# Patient Record
Sex: Female | Born: 1960 | Race: White | Hispanic: No | Marital: Married | Smoking: Current every day smoker
Health system: Southern US, Community
[De-identification: ages and names within clinical notes are randomized; demographics above are authoritative.]

## PROBLEM LIST (undated history)

## (undated) ENCOUNTER — Ambulatory Visit

## (undated) DIAGNOSIS — E785 Hyperlipidemia, unspecified: Secondary | ICD-10-CM

## (undated) DIAGNOSIS — F419 Anxiety disorder, unspecified: Secondary | ICD-10-CM

## (undated) DIAGNOSIS — E119 Type 2 diabetes mellitus without complications: Secondary | ICD-10-CM

## (undated) DIAGNOSIS — T7840XA Allergy, unspecified, initial encounter: Secondary | ICD-10-CM

## (undated) HISTORY — DX: Type 2 diabetes mellitus without complications: E11.9

## (undated) HISTORY — DX: Hyperlipidemia, unspecified: E78.5

## (undated) HISTORY — DX: Allergy, unspecified, initial encounter: T78.40XA

## (undated) HISTORY — PX: CHOLECYSTECTOMY: SHX55

## (undated) HISTORY — DX: Anxiety disorder, unspecified: F41.9

## (undated) HISTORY — PX: APPENDECTOMY: SHX54

## (undated) HISTORY — PX: COLONOSCOPY: SHX174

## (undated) HISTORY — PX: ABDOMINAL HYSTERECTOMY: SHX81

---

## 2000-03-13 ENCOUNTER — Other Ambulatory Visit: Admission: RE | Admit: 2000-03-13 | Discharge: 2000-03-13 | Payer: Self-pay | Admitting: Family Medicine

## 2000-03-24 ENCOUNTER — Other Ambulatory Visit: Admission: RE | Admit: 2000-03-24 | Discharge: 2000-03-24 | Payer: Self-pay | Admitting: *Deleted

## 2000-04-17 ENCOUNTER — Other Ambulatory Visit: Admission: RE | Admit: 2000-04-17 | Discharge: 2000-04-17 | Payer: Self-pay | Admitting: *Deleted

## 2000-04-17 ENCOUNTER — Encounter (INDEPENDENT_AMBULATORY_CARE_PROVIDER_SITE_OTHER): Payer: Self-pay

## 2000-05-14 ENCOUNTER — Encounter (INDEPENDENT_AMBULATORY_CARE_PROVIDER_SITE_OTHER): Payer: Self-pay | Admitting: Specialist

## 2000-05-14 ENCOUNTER — Other Ambulatory Visit: Admission: RE | Admit: 2000-05-14 | Discharge: 2000-05-14 | Payer: Self-pay | Admitting: *Deleted

## 2000-10-27 ENCOUNTER — Other Ambulatory Visit: Admission: RE | Admit: 2000-10-27 | Discharge: 2000-10-27 | Payer: Self-pay | Admitting: *Deleted

## 2000-12-01 ENCOUNTER — Other Ambulatory Visit: Admission: RE | Admit: 2000-12-01 | Discharge: 2000-12-01 | Payer: Self-pay | Admitting: *Deleted

## 2001-01-07 ENCOUNTER — Encounter (INDEPENDENT_AMBULATORY_CARE_PROVIDER_SITE_OTHER): Payer: Self-pay | Admitting: Specialist

## 2001-01-07 ENCOUNTER — Other Ambulatory Visit: Admission: RE | Admit: 2001-01-07 | Discharge: 2001-01-07 | Payer: Self-pay | Admitting: *Deleted

## 2002-07-25 ENCOUNTER — Emergency Department (HOSPITAL_COMMUNITY): Admission: EM | Admit: 2002-07-25 | Discharge: 2002-07-26 | Payer: Self-pay

## 2003-05-25 ENCOUNTER — Other Ambulatory Visit: Admission: RE | Admit: 2003-05-25 | Discharge: 2003-05-25 | Payer: Self-pay | Admitting: *Deleted

## 2003-06-02 ENCOUNTER — Encounter: Admission: RE | Admit: 2003-06-02 | Discharge: 2003-06-02 | Payer: Self-pay | Admitting: Family Medicine

## 2004-07-15 ENCOUNTER — Other Ambulatory Visit: Admission: RE | Admit: 2004-07-15 | Discharge: 2004-07-15 | Payer: Self-pay | Admitting: *Deleted

## 2004-07-15 ENCOUNTER — Encounter: Admission: RE | Admit: 2004-07-15 | Discharge: 2004-07-15 | Payer: Self-pay | Admitting: *Deleted

## 2005-08-04 ENCOUNTER — Encounter: Admission: RE | Admit: 2005-08-04 | Discharge: 2005-08-04 | Payer: Self-pay | Admitting: *Deleted

## 2006-08-10 ENCOUNTER — Encounter: Admission: RE | Admit: 2006-08-10 | Discharge: 2006-08-10 | Payer: Self-pay | Admitting: Internal Medicine

## 2008-08-14 ENCOUNTER — Encounter: Admission: RE | Admit: 2008-08-14 | Discharge: 2008-08-14 | Payer: Self-pay | Admitting: Internal Medicine

## 2011-08-22 ENCOUNTER — Ambulatory Visit (INDEPENDENT_AMBULATORY_CARE_PROVIDER_SITE_OTHER): Payer: 59 | Admitting: Family Medicine

## 2011-08-22 VITALS — BP 133/84 | HR 91 | Temp 98.3°F | Resp 16 | Ht 67.0 in | Wt 282.0 lb

## 2011-08-22 DIAGNOSIS — R0600 Dyspnea, unspecified: Secondary | ICD-10-CM

## 2011-08-22 DIAGNOSIS — R0982 Postnasal drip: Secondary | ICD-10-CM

## 2011-08-22 DIAGNOSIS — R05 Cough: Secondary | ICD-10-CM

## 2011-08-22 MED ORDER — DOXYCYCLINE HYCLATE 100 MG PO CAPS: 100.0000 mg | ORAL_CAPSULE | Freq: Two times a day (BID) | ORAL | Status: DC

## 2011-08-22 MED ORDER — IPRATROPIUM BROMIDE 0.03 % NA SOLN: 2.0000 | Freq: Four times a day (QID) | NASAL | Status: DC

## 2011-08-22 MED ORDER — BENZONATATE 100 MG PO CAPS: 100.0000 mg | ORAL_CAPSULE | Freq: Three times a day (TID) | ORAL | Status: DC | PRN

## 2011-08-22 NOTE — Progress Notes (Signed)
  Patient Name: Molly Payne Date of Birth: 11/17/1960 Medical Record Number: [id number] Gender: female Date of Encounter: 08/22/2011  History of Present Illness:  SHATERRIA SAGER is a 51 y.o. very pleasant female patient who presents with the following:  Here with a cough and PND since Monday, runny nose.  Coughing up mucus.  No fever that she knows of but she does have some sweats at night.   No GI symptoms No ST, no earache.    Concerned because she is a smoker- wants to be sure it is nothing serious  One PPD smoker since age 44  There is no problem list on file for this patient.  No past medical history on file. No past surgical history on file. History  Substance Use Topics  . Smoking status: Current Everyday Smoker  . Smokeless tobacco: Not on file  . Alcohol Use: Not on file   No family history on file. No Known Allergies  Medication list has been reviewed and updated.  Review of Systems: As per HPI- otherwise negative.   Physical Examination: Filed Vitals:   08/22/11 1814  BP: 133/84  Pulse: 91  Temp: 98.3 F (36.8 C)  TempSrc: Oral  Resp: 16  Height: 5\' 7"  (1.702 m)  Weight: 282 lb (127.914 kg)    Body mass index is 44.17 kg/(m^2). GEN: WDWN, NAD, Non-toxic, A & O x 3, obese HEENT: Atraumatic, Normocephalic. Neck supple. No masses, No LAD.  Tm and oropharynx wnl Ears and Nose: No external deformity. CV: RRR, No M/G/R. No JVD. No thrill. No extra heart sounds. PULM: CTA B, no wheezes, crackles, rhonchi. No retractions. No resp. distress. No accessory muscle use. ABD: S, NT, ND, +BS. No rebound. No HSM. EXTR: No c/c/e NEURO Normal gait.  PSYCH: Normally interactive. Conversant. Not depressed or anxious appearing.  Calm demeanor.     Assessment and Plan: 1. Cough  benzonatate (TESSALON) 100 MG capsule, doxycycline (VIBRAMYCIN) 100 MG capsule  2. PND (paroxysmal nocturnal dyspnea)    3. PND (post-nasal drip)  ipratropium (ATROVENT) 0.03 % nasal  spray   Suspect viral cough- try tessalon and atrovent nasal for PND Doxycycline to hang on to- can fill if not better in the next few days. Let me know if getting worse or if not better if she does end up using abx.  Encouraged to stop smoking

## 2011-12-11 ENCOUNTER — Ambulatory Visit (INDEPENDENT_AMBULATORY_CARE_PROVIDER_SITE_OTHER): Payer: 59 | Admitting: Internal Medicine

## 2011-12-11 VITALS — BP 118/76 | HR 91 | Temp 98.4°F | Resp 20 | Ht 67.0 in | Wt 263.0 lb

## 2011-12-11 DIAGNOSIS — J45909 Unspecified asthma, uncomplicated: Secondary | ICD-10-CM

## 2011-12-11 DIAGNOSIS — E119 Type 2 diabetes mellitus without complications: Secondary | ICD-10-CM

## 2011-12-11 DIAGNOSIS — R05 Cough: Secondary | ICD-10-CM

## 2011-12-11 DIAGNOSIS — E669 Obesity, unspecified: Secondary | ICD-10-CM | POA: Insufficient documentation

## 2011-12-11 DIAGNOSIS — F17200 Nicotine dependence, unspecified, uncomplicated: Secondary | ICD-10-CM

## 2011-12-11 LAB — POCT GLYCOSYLATED HEMOGLOBIN (HGB A1C): Hemoglobin A1C: 13.2

## 2011-12-11 MED ORDER — METFORMIN HCL 500 MG PO TABS: 500.0000 mg | ORAL_TABLET | Freq: Two times a day (BID) | ORAL | Status: DC

## 2011-12-11 MED ORDER — AZITHROMYCIN 500 MG PO TABS: 500.0000 mg | ORAL_TABLET | Freq: Every day | ORAL | Status: DC

## 2011-12-11 MED ORDER — HYDROCODONE-ACETAMINOPHEN 7.5-500 MG/15ML PO SOLN: 5.0000 mL | Freq: Four times a day (QID) | ORAL | Status: DC | PRN

## 2011-12-11 MED ORDER — ALBUTEROL SULFATE HFA 108 (90 BASE) MCG/ACT IN AERS: 2.0000 | INHALATION_SPRAY | Freq: Four times a day (QID) | RESPIRATORY_TRACT | Status: DC | PRN

## 2011-12-11 MED ORDER — METHYLPREDNISOLONE ACETATE 80 MG/ML IJ SUSP
120.0000 mg | Freq: Once | INTRAMUSCULAR | Status: AC
  Administered 2011-12-11: 120 mg via INTRAMUSCULAR

## 2011-12-11 NOTE — Patient Instructions (Addendum)
Chronic Asthmatic Bronchitis  Chronic asthmatic bronchitis is often a complication of frequent asthma and/or bronchitis. After a long enough period of time, the continual airflow blockage is present in spite of treatment for asthma. The medications that used to treat asthma no longer work. The symptoms of chronic bronchitis may also be present. Bronchitis is an inflammation of the breathing tubules in the lungs. The combination of asthma, chronic bronchitis, and emphysema all affect the small breathing tubules (bronchial tree) in our lungs. It is a common condition. The problems from each are similar and overlap with each other so are sometimes hard to diagnose.  When the asthma and bronchitis are combined, there is usually inflammation and infection. The small bronchial tubes produce more mucus. This blocks the airways and makes breathing harder. Usually this process is caused more by external irritants than infection. Smokers with chronic bronchitis are at a greater risk to develop asthmatic bronchitis.  CAUSES    Why some people with asthma go on to develop chronic asthmatic bronchitis is not known. Smoking and environmental toxins or allergens seem to play a role. There are wide differences in who is susceptible.   Abnormalities of the small airways may develop in persons with persistent asthma. Asthmatics can be uncommonly subject to the effects of smoking. Asthma is also found associated with a number of other diseases.  SYMPTOMS   Asthma, chronic bronchitis, and emphysema all cause symptoms of cough, wheezing, shortness of breath, and recurring infections. There may also be chest discomfort. All of the above symptoms happen more often in chronic asthmatic bronchitis.  DIAGNOSIS    Asthma, chronic bronchitis, and emphysema all affect the entire bronchial tree. This makes it difficult on exam to tell them apart. Other tests of the lungs are done to prove a diagnosis. These are called pulmonary function  tests.  TREATMENT    The asthmatic condition itself must always be treated.   Infection can be treated with antibiotics (medications to kill germs).   Serious infections may require hospitalization. These can include pneumonia, sinus infections, and acute bronchitis.  HOME CARE INSTRUCTIONS   Use prescription medications as ordered by your caregiver.   Avoid pollen, dust, animal dander, molds, smoke, and other things that cause attacks at home and at work.   You may have fewer attacks if you decrease dust in your home. Electrostatic air cleaners may help.   It may help to replace your pillows or mattress with materials less likely to cause allergies.   If you are not on fluid restriction, drink 8 to 10 glasses of water each day.   Discuss possible exercise routines with your caregiver.   If animal dander is the cause of asthma, you may need to get rid of pets.   It is important that you:   Become educated about your medical condition.   Participate in maintaining wellness.   Seek medical care promptly or immediately as indicated below.   Delay in seeking medical attention could cause permanent injury and may be a risk to your life.  SEEK MEDICAL CARE IF   You have wheezing and shortness of breath even if taking medicine to prevent attacks.   An oral temperature above 102 F (38.9 C)   You have muscle aches, chest pain, or thickening of sputum.   Your sputum changes from clear or white to yellow, green, gray, or bloody.   You have any problems that may be related to the medicine you are taking (such as   MEDICAL CARE IF:  Your usual medicines do not stop your wheezing.   There is increased coughing and/or shortness of breath.   You have increased difficulty breathing.  MAKE SURE YOU:   Understand these instructions.   Will watch your condition.   Will get help right away if you are not doing well or  get worse.  Document Released: 01/09/2006 Document Revised: 03/13/2011 Document Reviewed: 03/09/2007 [facility] Patient Information 2012 [place], [place].   Diabetes Meal Planning Guide The diabetes meal planning guide is a tool to help you plan your meals and snacks. It is important for people with diabetes to manage their blood glucose (sugar) levels. Choosing the right foods and the right amounts throughout your day will help control your blood glucose. Eating right can even help you improve your blood pressure and reach or maintain a healthy weight. CARBOHYDRATE COUNTING MADE EASY When you eat carbohydrates, they turn to sugar. This raises your blood glucose level. Counting carbohydrates can help you control this level so you feel better. When you plan your meals by counting carbohydrates, you can have more flexibility in what you eat and balance your medicine with your food intake. Carbohydrate counting simply means adding up the total amount of carbohydrate grams in your meals and snacks. Try to eat about the same amount at each meal. Foods with carbohydrates are listed below. Each portion below is 1 carbohydrate serving or 15 grams of carbohydrates. Ask your dietician how many grams of carbohydrates you should eat at each meal or snack. Grains and Starches  1 slice bread.    English muffin or hotdog/hamburger bun.    cup cold cereal (unsweetened).   ? cup cooked pasta or rice.    cup starchy vegetables (corn, potatoes, peas, beans, winter squash).   1 tortilla (6 inches).    bagel.   1 waffle or pancake (size of a CD).    cup cooked cereal.   4 to 6 small crackers.  *Whole grain is recommended. Fruit  1 cup fresh unsweetened berries, melon, papaya, pineapple.   1 small fresh fruit.    banana or mango.    cup fruit juice (4 oz unsweetened).    cup canned fruit in natural juice or water.   2 tbs dried fruit.   12 to 15 grapes or cherries.  Milk and Yogurt  1  cup fat-free or 1% milk.   1 cup soy milk.   6 oz light yogurt with sugar-free sweetener.   6 oz low-fat soy yogurt.   6 oz plain yogurt.  Vegetables  1 cup raw or  cup cooked is counted as 0 carbohydrates or a "free" food.   If you eat 3 or more servings at 1 meal, count them as 1 carbohydrate serving.  Other Carbohydrates   oz chips or pretzels.    cup ice cream or frozen yogurt.    cup sherbet or sorbet.   2 inch square cake, no frosting.   1 tbs honey, sugar, jam, jelly, or syrup.   2 small cookies.   3 squares of graham crackers.   3 cups popcorn.   6 crackers.   1 cup broth-based soup.   Count 1 cup casserole or other mixed foods as 2 carbohydrate servings.   Foods with less than 20 calories in a serving may be counted as 0 carbohydrates or a "free" food.  You may want to purchase a book or computer software that lists the carbohydrate gram counts of different  foods. In addition, the nutrition facts panel on the labels of the foods you eat are a good source of this information. The label will tell you how big the serving size is and the total number of carbohydrate grams you will be eating per serving. Divide this number by 15 to obtain the number of carbohydrate servings in a portion. Remember, 1 carbohydrate serving equals 15 grams of carbohydrate. SERVING SIZES Measuring foods and serving sizes helps you make sure you are getting the right amount of food. The list below tells how big or small some common serving sizes are.  1 oz.........4 stacked dice.   3 oz........Marland KitchenDeck of cards.   1 tsp.......Marland KitchenTip of little finger.   1 tbs......Marland KitchenMarland KitchenThumb.   2 tbs.......Marland KitchenGolf ball.    cup......Marland KitchenHalf of a fist.   1 cup.......Marland KitchenA fist.  SAMPLE DIABETES MEAL PLAN Below is a sample meal plan that includes foods from the grain and starches, dairy, vegetable, fruit, and meat groups. A dietician can individualize a meal plan to fit your calorie needs and tell you the  number of servings needed from each food group. However, controlling the total amount of carbohydrates in your meal or snack is more important than making sure you include all of the food groups at every meal. You may interchange carbohydrate containing foods (dairy, starches, and fruits). The meal plan below is an example of a 2000 calorie diet using carbohydrate counting. This meal plan has 17 carbohydrate servings. Breakfast  1 cup oatmeal (2 carb servings).    cup light yogurt (1 carb serving).   1 cup blueberries (1 carb serving).    cup almonds.  Snack  1 large apple (2 carb servings).   1 low-fat string cheese stick.  Lunch  Chicken breast salad.   1 cup spinach.    cup chopped tomatoes.   2 oz chicken breast, sliced.   2 tbs low-fat [place] dressing.   12 whole-wheat crackers (2 carb servings).   12 to 15 grapes (1 carb serving).   1 cup low-fat milk (1 carb serving).  Snack  1 cup carrots.    cup hummus (1 carb serving).  Dinner  3 oz broiled salmon.   1 cup brown rice (3 carb servings).  Snack  1  cups steamed broccoli (1 carb serving) drizzled with 1 tsp olive oil and lemon juice.   1 cup light pudding (2 carb servings).  DIABETES MEAL PLANNING WORKSHEET Your dietician can use this worksheet to help you decide how many servings of foods and what types of foods are right for you.  BREAKFAST Food Group and Servings / Carb Servings Grain/Starches __________________________________ Dairy __________________________________________ Vegetable ______________________________________ Fruit ___________________________________________ Meat __________________________________________ Fat ____________________________________________ LUNCH Food Group and Servings / Carb Servings Grain/Starches ___________________________________ Dairy ___________________________________________ Fruit ____________________________________________ Meat  ___________________________________________ Fat _____________________________________________ Laural Golden Food Group and Servings / Carb Servings Grain/Starches ___________________________________ Dairy ___________________________________________ Fruit ____________________________________________ Meat ___________________________________________ Fat _____________________________________________ SNACKS Food Group and Servings / Carb Servings Grain/Starches ___________________________________ Dairy ___________________________________________ Vegetable _______________________________________ Fruit ____________________________________________ Meat ___________________________________________ Fat _____________________________________________ DAILY TOTALS Starches _________________________ Vegetable ________________________ Fruit ____________________________ Dairy ____________________________ Meat ____________________________ Fat ______________________________ Document Released: 12/19/2004 Document Revised: 03/13/2011 Document Reviewed: 10/30/2008 [employer] Patient Information 2012 [place], [place].

## 2011-12-11 NOTE — Progress Notes (Signed)
  Subjective:    Patient ID: Molly Payne, female    DOB: [birth year], 51 y.o.   MRN: [id number]  HPI Chronic smoker Obese, hx of chronic wheezing Has an infection and sputum yellow-brown No sob/cp   Review of Systems Needs cpe and r/o copd    Objective:   Physical Exam  Constitutional: She is oriented to person, place, and time. She appears well-nourished. No distress.  HENT:  Right Ear: External ear normal.  Left Ear: External ear normal.  Mouth/Throat: Oropharynx is clear and moist.  Cardiovascular: Normal rate and normal heart sounds.   Pulmonary/Chest: Effort normal. No respiratory distress. She has wheezes. She exhibits no tenderness.  Neurological: She is alert and oriented to person, place, and time.  Skin: Skin is warm and dry.  Psychiatric: She has a normal mood and affect.   Labs Results for orders placed in visit on 12/11/11  GLUCOSE, POCT (MANUAL RESULT ENTRY)      Component Value Range   POC Glucose 285 (*) 70 - 99 mg/dl  POCT GLYCOSYLATED HEMOGLOBIN (HGB A1C)      Component Value Range   Hemoglobin A1C 13.2            Assessment & Plan:  Chronic asthmatic bronchitis Obesity/NIDDM stopped meds Schedule cpe 104/fup niddm Depomedrol im Zith/lortab/albuterol

## 2011-12-11 NOTE — Addendum Note (Signed)
Addended by: Jonita Albee on: 12/11/2011 12:19 PM   Modules accepted: Orders

## 2012-04-29 ENCOUNTER — Ambulatory Visit (INDEPENDENT_AMBULATORY_CARE_PROVIDER_SITE_OTHER): Payer: 59 | Admitting: Family Medicine

## 2012-04-29 VITALS — BP 140/89 | HR 98 | Temp 98.5°F | Resp 16 | Ht 67.0 in | Wt 274.0 lb

## 2012-04-29 DIAGNOSIS — J209 Acute bronchitis, unspecified: Secondary | ICD-10-CM

## 2012-04-29 DIAGNOSIS — E119 Type 2 diabetes mellitus without complications: Secondary | ICD-10-CM | POA: Insufficient documentation

## 2012-04-29 DIAGNOSIS — R059 Cough, unspecified: Secondary | ICD-10-CM

## 2012-04-29 DIAGNOSIS — R05 Cough: Secondary | ICD-10-CM

## 2012-04-29 LAB — BASIC METABOLIC PANEL
BUN: 9 mg/dL (ref 6–23)
CO2: 25 mEq/L (ref 19–32)
Chloride: 102 mEq/L (ref 96–112)
Potassium: 4.1 mEq/L (ref 3.5–5.3)

## 2012-04-29 LAB — BASIC METABOLIC PANEL WITH GFR
Calcium: 9.5 mg/dL (ref 8.4–10.5)
Creat: 0.66 mg/dL (ref 0.50–1.10)
Glucose, Bld: 132 mg/dL — ABNORMAL HIGH (ref 70–99)
Sodium: 137 meq/L (ref 135–145)

## 2012-04-29 LAB — POCT GLYCOSYLATED HEMOGLOBIN (HGB A1C): Hemoglobin A1C: 7.2

## 2012-04-29 MED ORDER — METFORMIN HCL 500 MG PO TABS: 500.0000 mg | ORAL_TABLET | Freq: Two times a day (BID) | ORAL | Status: DC

## 2012-04-29 MED ORDER — AZITHROMYCIN 250 MG PO TABS: ORAL_TABLET | ORAL | Status: DC

## 2012-04-29 MED ORDER — LISINOPRIL 2.5 MG PO TABS: 2.5000 mg | ORAL_TABLET | Freq: Every day | ORAL | Status: DC

## 2012-04-29 NOTE — Patient Instructions (Signed)
Acute Bronchitis You have acute bronchitis. This means you have a chest cold. The airways in your lungs are red and sore (inflamed). Acute means it is sudden onset.  CAUSES Bronchitis is most often caused by the same virus that causes a cold. SYMPTOMS   Body aches.  Chest congestion.  Chills.  Cough.  Fever.  Shortness of breath.  Sore throat. TREATMENT  Acute bronchitis is usually treated with rest, fluids, and medicines for relief of fever or cough. Most symptoms should go away after a few days or a week. Increased fluids may help thin your secretions and will prevent dehydration. Your caregiver may give you an inhaler to improve your symptoms. The inhaler reduces shortness of breath and helps control cough. You can take over-the-counter pain relievers or cough medicine to decrease coughing, pain, or fever. A cool-air vaporizer may help thin bronchial secretions and make it easier to clear your chest. Antibiotics are usually not needed but can be prescribed if you smoke, are seriously ill, have chronic lung problems, are elderly, or you are at higher risk for developing complications.Allergies and asthma can make bronchitis worse. Repeated episodes of bronchitis may cause longstanding lung problems. Avoid smoking and secondhand smoke.Exposure to cigarette smoke or irritating chemicals will make bronchitis worse. If you are a cigarette smoker, consider using nicotine gum or skin patches to help control withdrawal symptoms. Quitting smoking will help your lungs heal faster. Recovery from bronchitis is often slow, but you should start feeling better after 2 to 3 days. Cough from bronchitis frequently lasts for 3 to 4 weeks. To prevent another bout of acute bronchitis:  Quit smoking.  Wash your hands frequently to get rid of viruses or use a hand sanitizer.  Avoid other people with cold or virus symptoms.  Try not to touch your hands to your mouth, nose, or eyes. SEEK IMMEDIATE  MEDICAL CARE IF:  You develop increased fever, chills, or chest pain.  You have severe shortness of breath or bloody sputum.  You develop dehydration, fainting, repeated vomiting, or a severe headache.  You have no improvement after 1 week of treatment or you get worse. MAKE SURE YOU:   Understand these instructions.  Will watch your condition.  Will get help right away if you are not doing well or get worse. Document Released: 05/01/2004 Document Revised: 06/16/2011 Document Reviewed: 07/17/2010 [employer] Patient Information 2013 [employer].  

## 2012-04-29 NOTE — Progress Notes (Signed)
[facility]:  Office Visit  Chief Complaint:  Chief Complaint  Patient presents with  . Cough    x 5 days  . Fever  . Wheezing  . Medication Refill    HPI: Molly Payne is a 52 y.o. female who complains of: 1. coughing up green sputum, , subjective fever, chills,  wheezing in chest , + maxillary sinus pressure. Chronic smoker 1 ppd x 28 years  2. T2DM -noncompliant. Has not taken it for some time.  Forgets her PM dose. Last HbA1c in 12/2011 was 13.2.  Flu vaccine UTD, eye is UTD. Deneis neuropathy. Denies polydipsia/polyuria.    Past Medical History  Diagnosis Date  . Diabetes mellitus without complication    Past Surgical History  Procedure Date  . Appendectomy   . Cholecystectomy    History   Social History  . Marital Status: Married    Spouse Name: N/A    Number of Children: N/A  . Years of Education: N/A   Social History Main Topics  . Smoking status: Current Every Day Smoker -- 1.0 packs/day    Types: Cigarettes  . Smokeless tobacco: None  . Alcohol Use: None  . Drug Use: None  . Sexually Active: None   Other Topics Concern  . None   Social History Narrative  . None   History reviewed. No pertinent family history. No Known Allergies Prior to Admission medications   Medication Sig Start Date End Date Taking? Authorizing Provider  albuterol (PROVENTIL HFA;VENTOLIN HFA) 108 (90 BASE) MCG/ACT inhaler Inhale 2 puffs into the lungs every 6 (six) hours as needed for wheezing. 12/11/11 12/10/12 Yes Jonita Albee, MD  ipratropium (ATROVENT) 0.03 % nasal spray Place 2 sprays into the nose 4 (four) times daily. 08/22/11 08/21/12  Pearline Cables, MD  metFORMIN (GLUCOPHAGE) 500 MG tablet Take 1 tablet (500 mg total) by mouth 2 (two) times daily with a meal. 12/11/11 12/10/12  Jonita Albee, MD     ROS: The patient denies  night sweats, unintentional weight loss, chest pain, palpitations, dyspnea on exertion, nausea, vomiting, abdominal pain,  dysuria, hematuria, melena, numbness, weakness, or tingling.   All other systems have been reviewed and were otherwise negative with the exception of those mentioned in the HPI and as above.    PHYSICAL EXAM: Filed Vitals:   04/29/12 1626  BP: 140/89  Pulse: 98  Temp: 98.5 F (36.9 C)  Resp: 16   Filed Vitals:   04/29/12 1626  Height: 5\' 7"  (1.702 m)  Weight: 274 lb (124.286 kg)   Body mass index is 42.91 kg/(m^2).  General: Alert, no acute distress HEENT:  Normocephalic, atraumatic, oropharynx patent. EOMI, PERRLA, fundoc Cardiovascular:  Regular rate and rhythm, no rubs murmurs or gallops.  No Carotid bruits, radial pulse intact. No pedal edema.  Respiratory: Clear to auscultation bilaterally.  No wheezes, rales, or rhonchi.  No cyanosis, no use of accessory musculature GI: No organomegaly, abdomen is soft and non-tender, positive bowel sounds.  No masses. Skin: No rashes. Neurologic: Facial musculature symmetric. Microfilament exam normal.  Psychiatric: Patient is appropriate throughout our interaction. Lymphatic: No cervical lymphadenopathy Musculoskeletal: Gait intact.   LABS: Results for orders placed in visit on 04/29/12  POCT GLYCOSYLATED HEMOGLOBIN (HGB A1C)      Component Value Range   Hemoglobin A1C 7.2       EKG/XRAY:   Primary read interpreted by Dr. Conley Rolls at [facility].   ASSESSMENT/PLAN: Encounter Diagnoses  Name Primary?  Marland Kitchen  Acute bronchitis Yes  . Cough   . Type II or unspecified type diabetes mellitus without mention of complication, not stated as uncontrolled   . Diabetes mellitus    Rx Azithromycin Take OTC mucinex without decongestant Refilled metformin Advise patient about DM goals: ACEI, BP to be less than 130/80, HbA1c q3 months, do leg exam daily, eye exam annually. Advise to stop smoking.  Rx Lisinopril 2.5 mg daily  F/u in 3 months     Brenn Deziel PHUONG, DO 04/29/2012 5:26 PM

## 2012-04-30 ENCOUNTER — Telehealth: Payer: Self-pay

## 2012-04-30 NOTE — Telephone Encounter (Signed)
Pt.notified

## 2012-04-30 NOTE — Telephone Encounter (Signed)
Message copied by Johnnette Litter on Fri Apr 30, 2012 12:30 PM ------      Message from: [place], [place]      Created: Fri Apr 30, 2012 12:14 PM       Kidney, electrolytes look good.             F/u in 3 months

## 2012-07-17 ENCOUNTER — Ambulatory Visit (INDEPENDENT_AMBULATORY_CARE_PROVIDER_SITE_OTHER): Payer: 59 | Admitting: Physician Assistant

## 2012-07-17 VITALS — BP 138/84 | HR 94 | Temp 98.1°F | Resp 18 | Wt 277.0 lb

## 2012-07-17 DIAGNOSIS — R05 Cough: Secondary | ICD-10-CM

## 2012-07-17 DIAGNOSIS — J329 Chronic sinusitis, unspecified: Secondary | ICD-10-CM

## 2012-07-17 MED ORDER — AZITHROMYCIN 250 MG PO TABS: ORAL_TABLET | ORAL | Status: DC

## 2012-07-17 MED ORDER — HYDROCODONE-HOMATROPINE 5-1.5 MG/5ML PO SYRP: ORAL_SOLUTION | ORAL | Status: DC

## 2012-07-17 NOTE — Progress Notes (Signed)
Patient ID: Molly Payne MRN: [id number], DOB: 1961-04-04, 52 y.o. Date of Encounter: 07/17/2012, 10:16 AM  Primary Physician: Tally Due, MD  Chief Complaint:  Chief Complaint  Patient presents with  . Sinusitis    x 2 weeks  . Cough    x 2 weeks    HPI: 52 y.o. female presents with 14 day history of nasal congestion, post nasal drip, sore throat, sinus pressure, and cough. Afebrile. No chills. Nasal congestion thick and green/yellow. Cough is mildly productive of green/yellow sputum and not associated with time of day. No chest pain, chest tightness, shortness of breath, or wheezing. Nasal congestion and sinus pressure are the worst symptoms. Ears feel full, leading to sensation of muffled hearing. Using her inhaler. Asthma well controlled at baseline. No GI complaints. Appetite normal. No GI complaints. Still smoking. "Z pack usually works for me." Multiple sick contacts at work.    No recent antibiotics or recent travels.   No leg trauma, sedentary periods, or h/o cancer.  Past Medical History  Diagnosis Date  . Diabetes mellitus without complication      Home Meds: Prior to Admission medications   Medication Sig Start Date End Date Taking? Authorizing Provider  albuterol (PROVENTIL HFA;VENTOLIN HFA) 108 (90 BASE) MCG/ACT inhaler Inhale 2 puffs into the lungs every 6 (six) hours as needed for wheezing. 12/11/11 12/10/12 Yes Jonita Albee, MD  ipratropium (ATROVENT) 0.03 % nasal spray Place 2 sprays into the nose 4 (four) times daily. 08/22/11 08/21/12 Yes Jessica C Copland, MD  lisinopril (PRINIVIL,ZESTRIL) 2.5 MG tablet Take 1 tablet (2.5 mg total) by mouth daily. 04/29/12  Yes Thao P Le, DO  metFORMIN (GLUCOPHAGE) 500 MG tablet Take 1 tablet (500 mg total) by mouth 2 (two) times daily with a meal. 04/29/12 04/29/13 Yes Thao P Le, DO                  Allergies: No Known Allergies  History   Social History  . Marital Status: Married    Spouse Name: N/A    Number of  Children: N/A  . Years of Education: N/A   Occupational History  . Not on file.   Social History Main Topics  . Smoking status: Current Every Day Smoker -- 1.00 packs/day    Types: Cigarettes  . Smokeless tobacco: Not on file  . Alcohol Use: No  . Drug Use: No  . Sexually Active: Not on file   Other Topics Concern  . Not on file   Social History Narrative  . No narrative on file     Review of Systems: Constitutional: negative for chills, fever, night sweats or weight changes HEENT: see above Cardiovascular: negative for chest pain or palpitations Respiratory: positive for cough. negative for hemoptysis, dyspnea, wheezing, or shortness of breath Abdominal: negative for abdominal pain, nausea, vomiting or diarrhea Dermatological: negative for rash Neurologic: negative for headache   Physical Exam: Blood pressure 138/84, pulse 94, temperature 98.1 F (36.7 C), temperature source Oral, resp. rate 18, weight 277 lb (125.646 kg), SpO2 95.00%., Body mass index is 43.37 kg/(m^2). General: Well developed, well nourished, in no acute distress. Well appearing.  Head: Normocephalic, atraumatic, eyes without discharge, sclera non-icteric, nares are congested. Bilateral auditory canals clear, TM's are without perforation, pearly grey with reflective cone of light bilaterally. Serous effusion bilaterally behind TM's. Maxillary sinus TTP. Oral cavity moist, dentition normal. Posterior pharynx with post nasal drip and mild erythema. No peritonsillar abscess or tonsillar  exudate. Neck: Supple. No thyromegaly. Full ROM. No lymphadenopathy. Lungs: Coarse breath sounds bilaterally without wheezes, rales, or rhonchi. Breathing is unlabored.  Heart: RRR with S1 S2. No murmurs, rubs, or gallops appreciated. Msk:  Strength and tone normal for age. Extremities: No clubbing or cyanosis. No edema. Neuro: Alert and oriented X 3. Moves all extremities spontaneously. CNII-XII grossly in tact. Psych:   Responds to questions appropriately with a normal affect.     ASSESSMENT AND PLAN:  52 y.o. female with sinobronchitis and cough -Azithromycin 250 MG #6 2 po first day then 1 po next 4 days no RF -Hycodan #4oz 1 tsp po q 4-6 hours prn cough no RF SED -Has Atrovent nasal spray at home -Has albuterol inhaler at home -Mucinex -Tylenol/Motrin prn -Rest/fluids -Stop smoking -RTC precautions -RTC 3-5 days if no improvement  Signed, Eula Listen, PA-C 07/17/2012 10:16 AM

## 2012-09-09 ENCOUNTER — Ambulatory Visit (INDEPENDENT_AMBULATORY_CARE_PROVIDER_SITE_OTHER): Payer: 59 | Admitting: Internal Medicine

## 2012-09-09 VITALS — BP 124/74 | HR 82 | Temp 97.6°F | Resp 16 | Ht 67.0 in | Wt 278.0 lb

## 2012-09-09 DIAGNOSIS — K047 Periapical abscess without sinus: Secondary | ICD-10-CM

## 2012-09-09 DIAGNOSIS — R05 Cough: Secondary | ICD-10-CM

## 2012-09-09 DIAGNOSIS — F17210 Nicotine dependence, cigarettes, uncomplicated: Secondary | ICD-10-CM

## 2012-09-09 DIAGNOSIS — R51 Headache: Secondary | ICD-10-CM

## 2012-09-09 DIAGNOSIS — F17200 Nicotine dependence, unspecified, uncomplicated: Secondary | ICD-10-CM

## 2012-09-09 MED ORDER — AMOXICILLIN 500 MG PO CAPS: 1000.0000 mg | ORAL_CAPSULE | Freq: Two times a day (BID) | ORAL | Status: DC

## 2012-09-09 MED ORDER — HYDROCODONE-ACETAMINOPHEN 7.5-325 MG/15ML PO SOLN: 5.0000 mL | Freq: Four times a day (QID) | ORAL | Status: DC | PRN

## 2012-09-09 NOTE — Progress Notes (Signed)
  Subjective:    Patient ID: Molly Payne, female    DOB: March 13, 1961, 52 y.o.   MRN: [id number]  HPI Right Facial pain, has a bad upper tooth, no other congestion. Smoker, with chronic cough. No sob,cp.   Review of Systems dm    Objective:   Physical Exam        Assessment & Plan:

## 2012-09-09 NOTE — Progress Notes (Signed)
  Subjective:    Patient ID: Molly Payne, female    DOB: 1960-08-31, 52 y.o.   MRN: [id number]  HPI  Facial edema, pressure, and soreness x 7 days progressing.  Has had tooth pain, upper tooth has broken and only the root remains. Unsure if the edema is caused by sinusitis or broken tooth. Productive cough x 1 week,  clear sputum.  Denies otalgia, fever, headache, N/V, sinus drainage, abdominal pain, diarrhea. Smoker.   Review of Systems     Objective:   Physical Exam  Vitals reviewed. Constitutional: She appears well-nourished. She appears distressed.  HENT:  Right Ear: External ear normal.  Left Ear: External ear normal.  Nose: Sinus tenderness present. No mucosal edema. Right sinus exhibits maxillary sinus tenderness. Right sinus exhibits no frontal sinus tenderness. Left sinus exhibits no maxillary sinus tenderness and no frontal sinus tenderness.  Mouth/Throat: Oropharynx is clear and moist. Abnormal dentition. Dental abscesses and dental caries present.    Eyes: EOM are normal.  Pulmonary/Chest: Effort normal. No respiratory distress. She has no wheezes. She has no rales. She exhibits no tenderness.          Assessment & Plan:  Quit smoking/nicorrete Sinusitis/possible abscessed tooth Amoxil 1g bid/Lortab elxir

## 2012-09-09 NOTE — Patient Instructions (Addendum)
Sinusitis Sinusitis is redness, soreness, and swelling (inflammation) of the paranasal sinuses. Paranasal sinuses are air pockets within the bones of your face (beneath the eyes, the middle of the forehead, or above the eyes). In healthy paranasal sinuses, mucus is able to drain out, and air is able to circulate through them by way of your nose. However, when your paranasal sinuses are inflamed, mucus and air can become trapped. This can allow bacteria and other germs to grow and cause infection. Sinusitis can develop quickly and last only a short time (acute) or continue over a long period (chronic). Sinusitis that lasts for more than 12 weeks is considered chronic.  CAUSES  Causes of sinusitis include:  Allergies.  Structural abnormalities, such as displacement of the cartilage that separates your nostrils (deviated septum), which can decrease the air flow through your nose and sinuses and affect sinus drainage.  Functional abnormalities, such as when the small hairs (cilia) that line your sinuses and help remove mucus do not work properly or are not present. SYMPTOMS  Symptoms of acute and chronic sinusitis are the same. The primary symptoms are pain and pressure around the affected sinuses. Other symptoms include:  Upper toothache.  Earache.  Headache.  Bad breath.  Decreased sense of smell and taste.  A cough, which worsens when you are lying flat.  Fatigue.  Fever.  Thick drainage from your nose, which often is green and may contain pus (purulent).  Swelling and warmth over the affected sinuses. DIAGNOSIS  Your caregiver will perform a physical exam. During the exam, your caregiver may:  Look in your nose for signs of abnormal growths in your nostrils (nasal polyps).  Tap over the affected sinus to check for signs of infection.  View the inside of your sinuses (endoscopy) with a special imaging device with a light attached (endoscope), which is inserted into your  sinuses. If your caregiver suspects that you have chronic sinusitis, one or more of the following tests may be recommended:  Allergy tests.  Nasal culture A sample of mucus is taken from your nose and sent to a lab and screened for bacteria.  Nasal cytology A sample of mucus is taken from your nose and examined by your caregiver to determine if your sinusitis is related to an allergy. TREATMENT  Most cases of acute sinusitis are related to a viral infection and will resolve on their own within 10 days. Sometimes medicines are prescribed to help relieve symptoms (pain medicine, decongestants, nasal steroid sprays, or saline sprays).  However, for sinusitis related to a bacterial infection, your caregiver will prescribe antibiotic medicines. These are medicines that will help kill the bacteria causing the infection.  Rarely, sinusitis is caused by a fungal infection. In theses cases, your caregiver will prescribe antifungal medicine. For some cases of chronic sinusitis, surgery is needed. Generally, these are cases in which sinusitis recurs more than 3 times per year, despite other treatments. HOME CARE INSTRUCTIONS   Drink plenty of water. Water helps thin the mucus so your sinuses can drain more easily.  Use a humidifier.  Inhale steam 3 to 4 times a day (for example, sit in the bathroom with the shower running).  Apply a warm, moist washcloth to your face 3 to 4 times a day, or as directed by your caregiver.  Use saline nasal sprays to help moisten and clean your sinuses.  Take over-the-counter or prescription medicines for pain, discomfort, or fever only as directed by your caregiver. SEEK IMMEDIATE MEDICAL   CARE IF:  You have increasing pain or severe headaches.  You have nausea, vomiting, or drowsiness.  You have swelling around your face.  You have vision problems.  You have a stiff neck.  You have difficulty breathing. MAKE SURE YOU:   Understand these  instructions.  Will watch your condition.  Will get help right away if you are not doing well or get worse. Document Released: 03/24/2005 Document Revised: 06/16/2011 Document Reviewed: 04/08/2011 [facility] Patient Information 2014 [place], [place]. Dental Caries  Tooth decay (dental caries, cavities) is the most common of all oral diseases. It occurs in all ages but is more common in children and young adults.  CAUSES  Bacteria in your mouth combine with foods (particularly sugars and starches) to produce plaque. Plaque is a substance that sticks to the hard surfaces of teeth. The bacteria in the plaque produce acids that attack the enamel of teeth. Repeated acid attacks dissolve the enamel and create holes in the teeth. Root surfaces of teeth may also get these holes.  Other contributing factors include:   Frequent snacking and drinking of cavity-producing foods and liquids.  Poor oral hygiene.  Dry mouth.  Substance abuse such as methamphetamine.  Broken or poor fitting dental restorations.  Eating disorders.  Gastroesophageal reflux disease (GERD).  Certain radiation treatments to the head and neck. SYMPTOMS  At first, dental decay appears as white, chalky areas on the enamel. In this early stage, symptoms are seldom present. As the decay progresses, pits and holes may appear on the enamel surfaces. Progression of the decay will lead to softening of the hard layers of the tooth. At this point you may experience some pain or achy feeling after sweet, hot, or cold foods or drinks are consumed. If left untreated, the decay will reach the internal structures of the tooth and produce severe pain. Extensive dental treatment, such as root canal therapy, may be needed to save the tooth at this late stage of decay development.  DIAGNOSIS  Most cavities will be detected during regular check-ups. A thorough medical and dental history will be taken by the dentist. The dentist will use  instruments to check the surfaces of your teeth for any breakdown or discoloration. Some dentists have special instruments, such as lasers, that detect tooth decay. Dental X-rays may also show some cavities that are not visible to the eye (such as between the contact areas of the teeth). TREATMENT  Treatment involves removal of the tooth decay and replacement with a restorative material such as silver, gold, or composite (white) material. However, if the decay involves a large area of the tooth and there is little remaining healthy tooth structure, a cap (crown) will be fitted over the remaining structure. If the decay involves the center part of the tooth (pulp), root canal treatment will be needed before any type of dental restoration is placed. If the tooth is severely destroyed by the decay process, leaving the remaining tooth structures unrestorable, the tooth will need to be pulled (extracted). Some early tooth decay may be reversed by fluoride treatments and thorough brushing and flossing at home. PREVENTION   Eat healthy foods. Restrict the amount of sugary, starchy foods and liquids you consume. Avoid frequent snacking and drinking of unhealthy foods and liquids.  Sealants can help with prevention of cavities. Sealants are composite resins applied onto the biting surfaces of teeth at risk for decay. They smooth out the pits and grooves and prevent food from being trapped in them. This  is done in early childhood before tooth decay has started.  Fluoride tablets may also be prescribed to children between 6 months and 76 years of age if your drinking water is not fluoridated. The fluoride absorbed by the tooth enamel makes teeth less susceptible to decay. Thorough daily cleaning with a toothbrush and dental floss is the best way to prevent cavities. Use of a fluoride toothpaste is highly recommended. Fluoride mouth rinses may be used in specific cases.  Topical application of fluoride by your  dentist is important in children.  Regular visits with a dentist for checkups and cleanings are also important. SEEK IMMEDIATE DENTAL CARE IF:  You have a fever.  You develop redness and swelling of your face, jaw, or neck.  You develop swelling around a tooth.  You are unable to open your mouth or cannot swallow.  You have severe pain uncontrolled by pain medicine. Document Released: 12/14/2001 Document Revised: 06/16/2011 Document Reviewed: 08/29/2010 [facility] Patient Information 2014 [place], [place].

## 2012-12-23 ENCOUNTER — Ambulatory Visit (INDEPENDENT_AMBULATORY_CARE_PROVIDER_SITE_OTHER): Payer: 59 | Admitting: Emergency Medicine

## 2012-12-23 VITALS — BP 132/76 | HR 94 | Temp 99.3°F | Resp 18 | Ht 67.0 in | Wt 266.0 lb

## 2012-12-23 DIAGNOSIS — F17200 Nicotine dependence, unspecified, uncomplicated: Secondary | ICD-10-CM | POA: Insufficient documentation

## 2012-12-23 DIAGNOSIS — IMO0001 Reserved for inherently not codable concepts without codable children: Secondary | ICD-10-CM | POA: Insufficient documentation

## 2012-12-23 DIAGNOSIS — J45909 Unspecified asthma, uncomplicated: Secondary | ICD-10-CM

## 2012-12-23 DIAGNOSIS — R05 Cough: Secondary | ICD-10-CM

## 2012-12-23 DIAGNOSIS — J329 Chronic sinusitis, unspecified: Secondary | ICD-10-CM

## 2012-12-23 DIAGNOSIS — R062 Wheezing: Secondary | ICD-10-CM

## 2012-12-23 MED ORDER — ALBUTEROL SULFATE (2.5 MG/3ML) 0.083% IN NEBU
2.5000 mg | INHALATION_SOLUTION | Freq: Once | RESPIRATORY_TRACT | Status: AC
  Administered 2012-12-23: 2.5 mg via RESPIRATORY_TRACT

## 2012-12-23 MED ORDER — HYDROCODONE-HOMATROPINE 5-1.5 MG/5ML PO SYRP: ORAL_SOLUTION | ORAL | Status: DC

## 2012-12-23 MED ORDER — AZITHROMYCIN 250 MG PO TABS: ORAL_TABLET | ORAL | Status: DC

## 2012-12-23 NOTE — Progress Notes (Signed)
  Subjective:    Patient ID: Molly Payne, female    DOB: 1960-12-02, 52 y.o.   MRN: [id number]  HPI 52 year old female patient presents cough, congestion, and fever. Patient started having symptoms on September 15th. Having a fever at night. Patient smokes a pack a day. At night patient wheezes. Has a history of symptoms during this time of year. Peak flow is 220; expected peak flow is 424.    Review of Systems patient states she smokes a pack a day does not have any interest in quitting.     Objective:   Physical Exam H EENT exam TMs are clear. Nose is congested. Turbinates are blue and swollen. Throat is red. Neck is supple. Chest exam reveals prolonged expiration peak flow 220. Heart regular rate no murmurs.        Assessment & Plan:  She is to use an albuterol inhaler. She was also given a Z-Pak and Hycodan cough syrup. She was advised to quit smoking .

## 2012-12-23 NOTE — Patient Instructions (Addendum)
Smoking Cessation Quitting smoking is important to your health and has many advantages. However, it is not always easy to quit since nicotine is a very addictive drug. Often times, people try 3 times or more before being able to quit. This document explains the best ways for you to prepare to quit smoking. Quitting takes hard work and a lot of effort, but you can do it. ADVANTAGES OF QUITTING SMOKING  You will live longer, feel better, and live better.  Your body will feel the impact of quitting smoking almost immediately.  Within 20 minutes, blood pressure decreases. Your pulse returns to its normal level.  After 8 hours, carbon monoxide levels in the blood return to normal. Your oxygen level increases.  After 24 hours, the chance of having a heart attack starts to decrease. Your breath, hair, and body stop smelling like smoke.  After 48 hours, damaged nerve endings begin to recover. Your sense of taste and smell improve.  After 72 hours, the body is virtually free of nicotine. Your bronchial tubes relax and breathing becomes easier.  After 2 to 12 weeks, lungs can hold more air. Exercise becomes easier and circulation improves.  The risk of having a heart attack, stroke, cancer, or lung disease is greatly reduced.  After 1 year, the risk of coronary heart disease is cut in half.  After 5 years, the risk of stroke falls to the same as a nonsmoker.  After 10 years, the risk of lung cancer is cut in half and the risk of other cancers decreases significantly.  After 15 years, the risk of coronary heart disease drops, usually to the level of a nonsmoker.  If you are pregnant, quitting smoking will improve your chances of having a healthy baby.  The people you live with, especially any children, will be healthier.  You will have extra money to spend on things other than cigarettes. QUESTIONS TO THINK ABOUT BEFORE ATTEMPTING TO QUIT You may want to talk about your answers with your  caregiver.  Why do you want to quit?  If you tried to quit in the past, what helped and what did not?  What will be the most difficult situations for you after you quit? How will you plan to handle them?  Who can help you through the tough times? Your family? Friends? A caregiver?  What pleasures do you get from smoking? What ways can you still get pleasure if you quit? Here are some questions to ask your caregiver:  How can you help me to be successful at quitting?  What medicine do you think would be best for me and how should I take it?  What should I do if I need more help?  What is smoking withdrawal like? How can I get information on withdrawal? GET READY  Set a quit date.  Change your environment by getting rid of all cigarettes, ashtrays, matches, and lighters in your home, car, or work. Do not let people smoke in your home.  Review your past attempts to quit. Think about what worked and what did not. GET SUPPORT AND ENCOURAGEMENT You have a better chance of being successful if you have help. You can get support in many ways.  Tell your family, friends, and co-workers that you are going to quit and need their support. Ask them not to smoke around you.  Get individual, group, or telephone counseling and support. Programs are available at local hospitals and health centers. Call your local health department for   information about programs in your area.  Spiritual beliefs and practices may help some smokers quit.  Download a "quit meter" on your computer to keep track of quit statistics, such as how long you have gone without smoking, cigarettes not smoked, and money saved.  Get a self-help book about quitting smoking and staying off of tobacco. LEARN NEW SKILLS AND BEHAVIORS  Distract yourself from urges to smoke. Talk to someone, go for a walk, or occupy your time with a task.  Change your normal routine. Take a different route to work. Drink tea instead of coffee.  Eat breakfast in a different place.  Reduce your stress. Take a hot bath, exercise, or read a book.  Plan something enjoyable to do every day. Reward yourself for not smoking.  Explore interactive web-based programs that specialize in helping you quit. GET MEDICINE AND USE IT CORRECTLY Medicines can help you stop smoking and decrease the urge to smoke. Combining medicine with the above behavioral methods and support can greatly increase your chances of successfully quitting smoking.  Nicotine replacement therapy helps deliver nicotine to your body without the negative effects and risks of smoking. Nicotine replacement therapy includes nicotine gum, lozenges, inhalers, nasal sprays, and skin patches. Some may be available over-the-counter and others require a prescription.  Antidepressant medicine helps people abstain from smoking, but how this works is unknown. This medicine is available by prescription.  Nicotinic receptor partial agonist medicine simulates the effect of nicotine in your brain. This medicine is available by prescription. Ask your caregiver for advice about which medicines to use and how to use them based on your health history. Your caregiver will tell you what side effects to look out for if you choose to be on a medicine or therapy. Carefully read the information on the package. Do not use any other product containing nicotine while using a nicotine replacement product.  RELAPSE OR DIFFICULT SITUATIONS Most relapses occur within the first 3 months after quitting. Do not be discouraged if you start smoking again. Remember, most people try several times before finally quitting. You may have symptoms of withdrawal because your body is used to nicotine. You may crave cigarettes, be irritable, feel very hungry, cough often, get headaches, or have difficulty concentrating. The withdrawal symptoms are only temporary. They are strongest when you first quit, but they will go away within  10 14 days. To reduce the chances of relapse, try to:  Avoid drinking alcohol. Drinking lowers your chances of successfully quitting.  Reduce the amount of caffeine you consume. Once you quit smoking, the amount of caffeine in your body increases and can give you symptoms, such as a rapid heartbeat, sweating, and anxiety.  Avoid smokers because they can make you want to smoke.  Do not let weight gain distract you. Many smokers will gain weight when they quit, usually less than 10 pounds. Eat a healthy diet and stay active. You can always lose the weight gained after you quit.  Find ways to improve your mood other than smoking. FOR MORE INFORMATION  www.smokefree.gov  Document Released: 03/18/2001 Document Revised: 09/23/2011 Document Reviewed: 07/03/2011 [facility] Patient Information 2014 [place], [place]. Asthma, Adult Asthma is a condition that affects your lungs. It is characterized by swelling and narrowing of your airways as well as increased mucus production. The narrowing comes from swelling and muscle spasms inside the airways. When this happens, breathing can be difficult and you can have coughing, wheezing, and shortness of breath. Knowing more about asthma  can help you manage it better. Asthma cannot be cured, but medicines and lifestyle changes can help control it. Asthma can be a minor problem for some people but if it is not controlled it can lead to a life-threatening asthma attack. Asthma can change over time. It is important to work with your caregiver to manage your asthma symptoms. CAUSES The exact cause of asthma is unknown. Asthma is believed to be caused by inherited (genetic) and environmental exposures. Swelling and redness (inflammation) of the airways occurs in asthma. This can be triggered by allergies, viral lung infections, or irritants in the air. Allergic reactions can cause you to wheeze immediately or several hours after an exposure. Asthma triggers are different for  each person. It is important to pay attention and know what triggers your asthma.  Common triggers for asthma attacks include:  Animal dander from the skin, hair, or feathers of animals.  Dust mites contained in house dust.  Cockroaches.  Pollen from trees or grass.  Mold.  Cigarette or tobacco smoke. Smoking cannot be allowed in homes of people with asthma. People with asthma should not smoke and should not be around smokers.  Air pollutants such as dust, household cleaners, hair sprays, aerosol sprays, paint fumes, strong chemicals, or strong odors.  Cold air or weather changes. Cold air may cause inflammation. Winds increase molds and pollens in the air. There is not one best climate for people with asthma.  Strong emotions such as crying or laughing hard.  Stress.  Certain medicines such as aspirin or beta-blockers.  Sulfites in such foods and drinks as dried fruits and wine.  Infections or inflammatory conditions such as the flu, a cold, or an inflammation of the nasal membranes (rhinitis).  Gastroesophageal reflux disease (GERD). GERD is a condition where stomach acid backs up into your throat (esophagus).  Exercise or strenous activity. Proper pre-exercise medicines allow most people to participate in sports. SYMPTOMS  Feeling short of breath.  Chest tightness or pain.  Difficulty sleeping due to coughing, wheezing, or feeling short of breath.  A whistling or wheezing sound with exhalation.  Coughing or wheezing that is worse when you:  Have a virus (such as a cold or the flu).  Are suffering from allergies.  Are exposed to certain fumes or chemicals.  Exercise. Signs that your asthma is probably getting worse include:   More frequent and bothersome asthma signs and symptoms.  Increasing difficulty breathing. This can be measured by a peak flow meter, which is a simple device used to check how well your lungs are working.  An increasingly frequent need  to use a quick-relief inhaler. DIAGNOSIS  The diagnosis of asthma is made by review of your medical history, a physical exam, and possibly from other tests. Lung function studies may help with the diagnosis. TREATMENT  Asthma cannot be cured. However, for the majority of adults, asthma can be controlled with treatment. Besides avoidance of triggers of your asthma, medicines are often required. There are 2 classes of medicine used for asthma treatment: controller medicines (reduce inflammation and symptoms) andreliever or rescue medicines (relieve asthma symptoms during acute attacks). You may require daily medicines to control your asthma. The most effective long-term controller medicines for asthma are inhaled corticosteroids (blocks inflammation). Other long-term control medicines include:  Leukotriene receptor antagonists (blocks a pathway of inflammation).  Long-acting beta2-agonists (relaxes the muscles of the airways for at least 12 hours) with an inhaled corticosteroid.  Cromolyn sodium or nedocromil (alters certain  inflammatory cells' ability to release chemicals that cause inflammation).  Immunomodulators (alters the immune system to prevent asthma symptoms).  Theophylline (relaxes muscles in the airways). You may also require a short-acting beta2-agonist to relieve asthma symptoms during an acute attack. You should understand what to do during an acute attack. Inhaled medicines are effective when used properly. Read the instructions on how to use your medicines correctly and speak to your caregiver if you have questions. Follow up with your caregiver on a regular basis to make sure your asthma is well-controlled. If your asthma is not well-controlled, if you have been hospitalized for asthma, or if multiple medicines or medium to high doses of inhaled corticosteroids are needed to control your asthma, request a referral to an asthma specialist. HOME CARE INSTRUCTIONS   Take medicines as  directed by your caregiver.  Control your home environment in the following ways to help prevent asthma attacks:  Change your heating and air conditioning filter at least once a month.  Place a filter or cheesecloth over your heating and air conditioning vents.  Limit the use of fireplaces and wood stoves.  Do not smoke. Do not stay in places where others are smoking.  Get rid of pests (such as roaches and mice) and their droppings.  If you see mold on a plant, throw it away.  Clean your floors and dust every week. Use unscented cleaning products. Use a vacuum cleaner with a HEPA filter if possible. If vacuuming or cleaning triggers your asthma, try to find someone else to do these chores.  Floors in your house should be wood, tile, or vinyl. Carpet can trap dander and dust.  Use allergy-proof pillows, mattress covers, and box spring covers.  Wash bedsheets and blankets every week in hot water and dry in a dryer.  Use a blanket that is made of polyester or cotton with a tight nap.  Do not use a dust ruffle on your bed.  Clean bathrooms and kitchens with bleach and repaint with mold-resistant paint.  Wash hands frequently.  Talk to your caregiver about an action plan for managing asthma attacks. This includes the use of a peak flow meter which measures the severity of the attack and medicines that can help stop the attack. An action plan can help minimize or stop the attack without having to seek medical care.  Remain calm during an asthma attack.  Always have a plan prepared for seeking medical attention. This should include contacting your caregiver and in the case of a severe attack, calling your local emergency services (911 in [place] SEEK MEDICAL CARE IF:   You have wheezing, shortness of breath, or a cough even if taking medicine to prevent attacks.  You have thickening of sputum.  Your sputum changes from clear or white to yellow, green, gray, or bloody.  You have  any problems that may be related to the medicines you are taking (such as a rash, itching, swelling, or trouble breathing).  You are using a reliever medicine more than 2 3 times per week.  Your peak flow is still at 50 79% of personal best after following your action plan for 1 hour. SEEK IMMEDIATE MEDICAL CARE IF:   You are short of breath even at rest.  You get short of breath when doing very little physical activity.  You have difficulty eating, drinking, or talking due to asthma symptoms.  You have chest pain or you feel that your heart is beating fast.  You have  a bluish color to your lips or fingernails.  You are lightheaded, dizzy, or faint.  You have a fever or persistent symptoms for more than 2 3 days.  You have a fever and symptoms suddenly get worse.  You seem to be getting worse and are unresponsive to treatment during an asthma attack.  Your peak flow is less than 50% of personal best. MAKE SURE YOU:   Understand these instructions.  Will watch your condition.  Will get help right away if you are not doing well or get worse. Document Released: 03/24/2005 Document Revised: 03/10/2012 Document Reviewed: 11/10/2007 [facility] Patient Information 2014 [place], [place].

## 2013-04-11 ENCOUNTER — Ambulatory Visit (INDEPENDENT_AMBULATORY_CARE_PROVIDER_SITE_OTHER): Payer: 59 | Admitting: Emergency Medicine

## 2013-04-11 VITALS — BP 124/70 | HR 100 | Temp 99.7°F | Resp 16 | Ht 66.5 in | Wt 259.0 lb

## 2013-04-11 DIAGNOSIS — J111 Influenza due to unidentified influenza virus with other respiratory manifestations: Secondary | ICD-10-CM

## 2013-04-11 MED ORDER — OSELTAMIVIR PHOSPHATE 75 MG PO CAPS: 75.0000 mg | ORAL_CAPSULE | Freq: Two times a day (BID) | ORAL | Status: DC

## 2013-04-11 MED ORDER — PROMETHAZINE-CODEINE 6.25-10 MG/5ML PO SYRP: 5.0000 mL | ORAL_SOLUTION | Freq: Four times a day (QID) | ORAL | Status: DC | PRN

## 2013-04-11 NOTE — Patient Instructions (Signed)

## 2013-04-11 NOTE — Progress Notes (Signed)
[facility] [address] [place] [address] [phone]  Date:  04/11/2013   Name:  Molly Payne   DOB:  Sep 04, 1960   MRN:  [id number]  PCP:  Kennon Portela, MD    Chief Complaint: Influenza   History of Present Illness:  Molly Payne is a 53 y.o. very pleasant female patient who presents with the following:  Ill since Saturday night with fever, chills, malaise, myalgias, and cough.  Non productive.  No wheezing or shortness of breath.  No rash, nausea or vomiting.  Headache and sore throat.  No improvement with over the counter medications or other home remedies. Denies other complaint or health concern today.   Patient Active Problem List   Diagnosis Date Noted  . Smoking 12/23/2012  . Diabetes mellitus 04/29/2012  . Obesity 12/11/2011    Past Medical History  Diagnosis Date  . Diabetes mellitus without complication     Past Surgical History  Procedure Laterality Date  . Appendectomy    . Cholecystectomy    . Abdominal hysterectomy      History  Substance Use Topics  . Smoking status: Current Every Day Smoker -- 1.00 packs/day for 22 years    Types: Cigarettes  . Smokeless tobacco: Not on file  . Alcohol Use: No    No family history on file.  No Known Allergies  Medication list has been reviewed and updated.  Current Outpatient Prescriptions on File Prior to Visit  Medication Sig Dispense Refill  . albuterol (PROVENTIL HFA;VENTOLIN HFA) 108 (90 BASE) MCG/ACT inhaler Inhale 2 puffs into the lungs every 6 (six) hours as needed for wheezing.  1 Inhaler  3  . amoxicillin (AMOXIL) 500 MG capsule Take 2 capsules (1,000 mg total) by mouth 2 (two) times daily.  40 capsule  0  . azithromycin (ZITHROMAX Z-PAK) 250 MG tablet 2 tabs po first day, then 1 tab po next 4 days  6 tablet  0  . HYDROcodone-acetaminophen (HYCET) 7.5-325 mg/15 ml solution Take 5 mLs by mouth every 6 (six) hours as needed for pain (or cough).  240 mL  0  .  HYDROcodone-homatropine (HYCODAN) 5-1.5 MG/5ML syrup 1 TSP PO Q 4-6 HOURS PRN COUGH  120 mL  0  . ipratropium (ATROVENT) 0.03 % nasal spray Place 2 sprays into the nose 4 (four) times daily.  30 mL  6  . lisinopril (PRINIVIL,ZESTRIL) 2.5 MG tablet Take 1 tablet (2.5 mg total) by mouth daily.  30 tablet  6  . metFORMIN (GLUCOPHAGE) 500 MG tablet Take 1 tablet (500 mg total) by mouth 2 (two) times daily with a meal.  180 tablet  3   No current facility-administered medications on file prior to visit.    Review of Systems:  As per HPI, otherwise negative.    Physical Examination: Filed Vitals:   04/11/13 1141  BP: 124/70  Pulse: 100  Temp: 99.7 F (37.6 C)  Resp: 16   Filed Vitals:   04/11/13 1141  Height: 5' 6.5" (1.689 m)  Weight: 259 lb (117.482 kg)   Body mass index is 41.18 kg/(m^2). Ideal Body Weight: Weight in (lb) to have BMI = 25: 156.9  GEN: WDWN, NAD, Non-toxic, A & O x 3 HEENT: Atraumatic, Normocephalic. Neck supple. No masses, No LAD. Ears and Nose: No external deformity. CV: RRR, No M/G/R. No JVD. No thrill. No extra heart sounds. PULM: CTA B, no wheezes, crackles, rhonchi. No retractions. No resp. distress. No  accessory muscle use. ABD: S, NT, ND, +BS. No rebound. No HSM. EXTR: No c/c/e NEURO Normal gait.  PSYCH: Normally interactive. Conversant. Not depressed or anxious appearing.  Calm demeanor.    Assessment and Plan: Influenza tamiflu Phen c cod  Signed,  Ellison Carwin, MD

## 2013-04-27 ENCOUNTER — Ambulatory Visit: Payer: 59

## 2013-04-27 ENCOUNTER — Ambulatory Visit (INDEPENDENT_AMBULATORY_CARE_PROVIDER_SITE_OTHER): Payer: 59 | Admitting: Family Medicine

## 2013-04-27 VITALS — BP 128/80 | HR 112 | Temp 98.1°F | Resp 16 | Ht 66.0 in | Wt 259.0 lb

## 2013-04-27 DIAGNOSIS — R05 Cough: Secondary | ICD-10-CM

## 2013-04-27 DIAGNOSIS — R059 Cough, unspecified: Secondary | ICD-10-CM

## 2013-04-27 DIAGNOSIS — R21 Rash and other nonspecific skin eruption: Secondary | ICD-10-CM

## 2013-04-27 DIAGNOSIS — J40 Bronchitis, not specified as acute or chronic: Secondary | ICD-10-CM

## 2013-04-27 DIAGNOSIS — L989 Disorder of the skin and subcutaneous tissue, unspecified: Secondary | ICD-10-CM

## 2013-04-27 LAB — POCT SKIN KOH: SKIN KOH, POC: NEGATIVE

## 2013-04-27 MED ORDER — AZITHROMYCIN 250 MG PO TABS: ORAL_TABLET | ORAL | Status: DC

## 2013-04-27 MED ORDER — IPRATROPIUM BROMIDE 0.06 % NA SOLN: 2.0000 | Freq: Three times a day (TID) | NASAL | Status: DC

## 2013-04-27 MED ORDER — HYDROCODONE-HOMATROPINE 5-1.5 MG/5ML PO SYRP: ORAL_SOLUTION | ORAL | Status: DC

## 2013-04-27 NOTE — Progress Notes (Signed)
Subjective:    Patient ID: Molly Payne, female    DOB: February 25, 1961, 53 y.o.   MRN: [id number]  HPI 53 y.o. female presents with a 2 day history of nasal congestion, post nasal drip, sore throat, sinus pressure, and cough. Subjective fever and chills. Nasal congestion thick and green/yellow. Sinus pressure is the worst symptom. Cough is productive of tan sputum and worse at nighttime. No SOB or wheezing. Ears feel full, leading to sensation of muffled hearing. Has tried OTC cold preps without success. No GI complaints. Appetite decreased. Multiple sick contacts at work. No recent antibiotics or recent travels. No leg trauma, sedentary periods, or h/o cancer. She is an everyday tobacco smoker of 22 pack-years.   Patient recently with clinically diagnosed influenza at the beginning of January. Treated with Tamiflu. Her cough never fully resolved, although she does have a cough at baseline secondary to her tobacco use.   She also mentions a year long history of a lesion along the posterior aspect of her scalp about the size of her fingernail. She picks at the lesion periodically and has developed a scab over it. She does not note a change in size, but has not been able to see it secondary to it location. No drainage or discharge from the lesion.   PMH: Past Medical History  Diagnosis Date  . Diabetes mellitus without complication     Home Meds: Prior to Admission medications   Medication Sig Start Date End Date Taking? Authorizing Provider  lisinopril (PRINIVIL,ZESTRIL) 2.5 MG tablet Take 1 tablet (2.5 mg total) by mouth daily. 04/29/12  Yes Thao P Le, DO  metFORMIN (GLUCOPHAGE) 500 MG tablet Take 1 tablet (500 mg total) by mouth 2 (two) times daily with a meal. 04/29/12 04/29/13 Yes Thao P Le, DO  albuterol (PROVENTIL HFA;VENTOLIN HFA) 108 (90 BASE) MCG/ACT inhaler Inhale 2 puffs into the lungs every 6 (six) hours as needed for wheezing. 12/11/11 04/11/13 No Benn Moulder Guest, MD  ipratropium (ATROVENT)  0.03 % nasal spray Place 2 sprays into the nose 4 (four) times daily. 08/22/11 08/21/12 No Darreld Mclean, MD    Allergies: No Known Allergies  History   Social History  . Marital Status: Married    Spouse Name: N/A    Number of Children: N/A  . Years of Education: N/A   Occupational History  . Not on file.   Social History Main Topics  . Smoking status: Current Every Day Smoker -- 1.00 packs/day for 22 years    Types: Cigarettes  . Smokeless tobacco: Not on file  . Alcohol Use: No  . Drug Use: No  . Sexual Activity: Not on file   Other Topics Concern  . Not on file   Social History Narrative  . No narrative on file      Review of Systems  Constitutional: Positive for fever, chills and fatigue.  HENT: Positive for congestion, hearing loss, postnasal drip, sinus pressure and sneezing. Negative for rhinorrhea and sore throat.        Muffled hearing  Respiratory: Positive for cough. Negative for shortness of breath and wheezing.        Cough is mildly productive of tan sputum.  Cough is worse at nighttime.   Gastrointestinal: Positive for nausea. Negative for vomiting and diarrhea.  Musculoskeletal: Positive for myalgias.  Neurological: Positive for headaches.       Objective:   Physical Exam  Physical Exam: Blood pressure 128/80, pulse 112, temperature 98.1 F (36.7  C), temperature source Oral, resp. rate 16, height 5\' 6"  (1.676 m), weight 259 lb (117.482 kg), SpO2 96.00%., Body mass index is 41.82 kg/(m^2). General: Well developed, well nourished, in no acute distress. Head: Normocephalic, atraumatic, eyes without discharge, sclera non-icteric, nares are congested. Bilateral auditory canals clear, TM's are without perforation, pearly grey with reflective cone of light bilaterally. Serous effusion bilaterally behind TM's. Maxillary sinus TTP. Oral cavity moist, dentition normal. Posterior pharynx with post nasal drip and mild erythema. No peritonsillar abscess or  tonsillar exudate. Uvula midline.  Neck: Supple. No thyromegaly. Full ROM. No lymphadenopathy. Lungs: Coarse breath sounds without wheezes, rales, or rhonchi. Breathing is unlabored.  Heart: RRR with S1 S2. No murmurs, rubs, or gallops appreciated. Msk:  Strength and tone normal for age. Extremities: No clubbing or cyanosis. No edema. Skin: Scabbed over 0.5 cm x 0.5 cm circular lesion over the occipital region of the scalp. No surrounding erythema, swelling, induration, fluctuance, or TTP.  Neuro: Alert and oriented X 3. Moves all extremities spontaneously. CNII-XII grossly in tact. Psych:  Responds to questions appropriately with a normal affect.   Labs: Results for orders placed in visit on 04/27/13  POCT SKIN KOH      Result Value Range   Skin KOH, POC Negative     CXR:  [facility] reading (PRIMARY) by  Dr. Lorelei Pont. Negative.     Assessment & Plan:  53 year old female with bronchitis secondary to recent influenza and rash of the scalp   1) Bronchitis secondary to recent influenza -Azithromycin 250 MG #6 2 po first day then 1 po next 4 days no RF -Atrovent NS 0.06% 2 sprays each nare bid prn #1 no RF -Hycodan #4oz 1 tsp po q 4-6 hours prn cough no RF SED -Mucinex -Stop smoking   2) Scalp lesion -Dermatology referral -Plan to refer secondary to lesion location and cosmesis   3) She plans to schedule a CPE with me soon   Christell Faith, MHS, PA-C [facility] [address] [place], [place] [address] [place] 04/27/2013 8:09 AM

## 2013-08-19 ENCOUNTER — Ambulatory Visit (INDEPENDENT_AMBULATORY_CARE_PROVIDER_SITE_OTHER): Payer: 59 | Admitting: Family Medicine

## 2013-08-19 VITALS — BP 130/80 | HR 91 | Temp 98.2°F | Resp 16 | Ht 67.0 in | Wt 256.0 lb

## 2013-08-19 DIAGNOSIS — R05 Cough: Secondary | ICD-10-CM

## 2013-08-19 DIAGNOSIS — L0390 Cellulitis, unspecified: Secondary | ICD-10-CM

## 2013-08-19 DIAGNOSIS — R059 Cough, unspecified: Secondary | ICD-10-CM

## 2013-08-19 DIAGNOSIS — L0291 Cutaneous abscess, unspecified: Secondary | ICD-10-CM

## 2013-08-19 MED ORDER — HYDROCODONE-HOMATROPINE 5-1.5 MG/5ML PO SYRP
5.0000 mL | ORAL_SOLUTION | Freq: Three times a day (TID) | ORAL | Status: DC | PRN
Start: 2013-08-19 — End: 2014-08-09

## 2013-08-19 MED ORDER — AMOXICILLIN-POT CLAVULANATE 875-125 MG PO TABS: 1.0000 | ORAL_TABLET | Freq: Two times a day (BID) | ORAL | Status: DC

## 2013-08-19 MED ORDER — ALBUTEROL SULFATE HFA 108 (90 BASE) MCG/ACT IN AERS: 2.0000 | INHALATION_SPRAY | Freq: Four times a day (QID) | RESPIRATORY_TRACT | Status: DC | PRN

## 2013-08-19 NOTE — Progress Notes (Addendum)
° °  Subjective:    Patient ID: Molly Payne, female    DOB: 1960/08/14, 54 y.o.   MRN: [id number]  Abscess Associated symptoms include coughing.  Sinusitis Associated symptoms include coughing and sinus pressure.   Chief Complaint  Patient presents with   Abscess    x 3 days    Sinusitis    x 1 week     This chart was scribed for Robyn Haber, MD by Thea Alken, ED Scribe. This patient was seen in room 14 and the patient's care was started at 2:11 PM.  HPI Comments: Molly Payne is a 53 y.o. female who presents to the [facility] complaining of Sinusitis onset 1 week with  associated intermittent cough. She denies taking OTC medication. Pt has h/o asthma and uses her inhaler only when needed.  Pt also c/o abscess on left side of neck onset 3 days. Pt describes the pain as sore.  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 Authorizing Provider  albuterol (PROVENTIL HFA;VENTOLIN HFA) 108 (90 BASE) MCG/ACT inhaler Inhale 2 puffs into the lungs every 6 (six) hours as needed for wheezing. 12/11/11 04/11/13  Orma Flaming, MD  metFORMIN (GLUCOPHAGE) 500 MG tablet Take 1 tablet (500 mg total) by mouth 2 (two) times daily with a meal. 04/29/12 04/29/13  Thao P Le, DO   Review of Systems  HENT: Positive for sinus pressure.   Respiratory: Positive for cough.   Skin: Negative for wound.      Objective:   Physical Exam  Nursing note and vitals reviewed. Constitutional: She is oriented to person, place, and time. She appears well-developed and well-nourished. No distress.  HENT:  Head: Normocephalic and atraumatic.  Eyes: EOM are normal.  Neck: Neck supple.  Cardiovascular: Normal rate.   Pulmonary/Chest: Effort normal. No respiratory distress. She has wheezes. She has no rales.    Musculoskeletal: Normal range of motion.  Neurological: She is alert and oriented to person, place, and time.  Skin: Skin is warm and dry.  Half centimeter indurated erythematous early abscess left posterior neck at the hairline  Psychiatric: She has a normal mood and affect. Her behavior is normal.      Assessment & Plan:   1. Cough   2. Abscess    Meds ordered this encounter  Medications   albuterol (PROVENTIL HFA;VENTOLIN HFA) 108 (90 BASE) MCG/ACT inhaler    Sig: Inhale 2 puffs into the lungs every 6 (six) hours as needed for wheezing.    Dispense:  1 Inhaler    Refill:  3   HYDROcodone-homatropine (HYCODAN) 5-1.5 MG/5ML syrup    Sig: Take 5 mLs by mouth every 8 (eight) hours as needed for cough.    Dispense:  120 mL    Refill:  0   amoxicillin-clavulanate (AUGMENTIN) 875-125 MG per tablet    Sig: Take 1 tablet by mouth 2 (two) times daily.    Dispense:  20 tablet    Refill:  0   Hot compresses to the boil   Robyn Haber

## 2013-08-19 NOTE — Patient Instructions (Signed)
Abscess An abscess is an infected area that contains a collection of pus and debris.It can occur in almost any part of the body. An abscess is also known as a furuncle or boil. CAUSES  An abscess occurs when tissue gets infected. This can occur from blockage of oil or sweat glands, infection of hair follicles, or a minor injury to the skin. As the body tries to fight the infection, pus collects in the area and creates pressure under the skin. This pressure causes pain. People with weakened immune systems have difficulty fighting infections and get certain abscesses more often.  SYMPTOMS Usually an abscess develops on the skin and becomes a painful mass that is red, warm, and tender. If the abscess forms under the skin, you may feel a moveable soft area under the skin. Some abscesses break open (rupture) on their own, but most will continue to get worse without care. The infection can spread deeper into the body and eventually into the bloodstream, causing you to feel ill.  DIAGNOSIS  Your caregiver will take your medical history and perform a physical exam. A sample of fluid may also be taken from the abscess to determine what is causing your infection. TREATMENT  Your caregiver may prescribe antibiotic medicines to fight the infection. However, taking antibiotics alone usually does not cure an abscess. Your caregiver may need to make a small cut (incision) in the abscess to drain the pus. In some cases, gauze is packed into the abscess to reduce pain and to continue draining the area. HOME CARE INSTRUCTIONS   Only take over-the-counter or prescription medicines for pain, discomfort, or fever as directed by your caregiver.  If you were prescribed antibiotics, take them as directed. Finish them even if you start to feel better.  If gauze is used, follow your caregiver's directions for changing the gauze.  To avoid spreading the infection:  Keep your draining abscess covered with a  bandage.  Wash your hands well.  Do not share personal care items, towels, or whirlpools with others.  Avoid skin contact with others.  Keep your skin and clothes clean around the abscess.  Keep all follow-up appointments as directed by your caregiver. SEEK MEDICAL CARE IF:   You have increased pain, swelling, redness, fluid drainage, or bleeding.  You have muscle aches, chills, or a general ill feeling.  You have a fever. MAKE SURE YOU:   Understand these instructions.  Will watch your condition.  Will get help right away if you are not doing well or get worse. Document Released: 01/01/2005 Document Revised: 09/23/2011 Document Reviewed: 06/06/2011 [facility] Patient Information 2014 [place]. Sinusitis Sinusitis is redness, soreness, and swelling (inflammation) of the paranasal sinuses. Paranasal sinuses are air pockets within the bones of your face (beneath the eyes, the middle of the forehead, or above the eyes). In healthy paranasal sinuses, mucus is able to drain out, and air is able to circulate through them by way of your nose. However, when your paranasal sinuses are inflamed, mucus and air can become trapped. This can allow bacteria and other germs to grow and cause infection. Sinusitis can develop quickly and last only a short time (acute) or continue over a long period (chronic). Sinusitis that lasts for more than 12 weeks is considered chronic.  CAUSES  Causes of sinusitis include:  Allergies.  Structural abnormalities, such as displacement of the cartilage that separates your nostrils (deviated septum), which can decrease the air flow through your nose and sinuses and affect  sinus drainage.  Functional abnormalities, such as when the small hairs (cilia) that line your sinuses and help remove mucus do not work properly or are not present. SYMPTOMS  Symptoms of acute and chronic sinusitis are the same. The primary symptoms are pain and pressure around the  affected sinuses. Other symptoms include:  Upper toothache.  Earache.  Headache.  Bad breath.  Decreased sense of smell and taste.  A cough, which worsens when you are lying flat.  Fatigue.  Fever.  Thick drainage from your nose, which often is green and may contain pus (purulent).  Swelling and warmth over the affected sinuses. DIAGNOSIS  Your caregiver will perform a physical exam. During the exam, your caregiver may:  Look in your nose for signs of abnormal growths in your nostrils (nasal polyps).  Tap over the affected sinus to check for signs of infection.  View the inside of your sinuses (endoscopy) with a special imaging device with a light attached (endoscope), which is inserted into your sinuses. If your caregiver suspects that you have chronic sinusitis, one or more of the following tests may be recommended:  Allergy tests.  Nasal culture A sample of mucus is taken from your nose and sent to a lab and screened for bacteria.  Nasal cytology A sample of mucus is taken from your nose and examined by your caregiver to determine if your sinusitis is related to an allergy. TREATMENT  Most cases of acute sinusitis are related to a viral infection and will resolve on their own within 10 days. Sometimes medicines are prescribed to help relieve symptoms (pain medicine, decongestants, nasal steroid sprays, or saline sprays).  However, for sinusitis related to a bacterial infection, your caregiver will prescribe antibiotic medicines. These are medicines that will help kill the bacteria causing the infection.  Rarely, sinusitis is caused by a fungal infection. In theses cases, your caregiver will prescribe antifungal medicine. For some cases of chronic sinusitis, surgery is needed. Generally, these are cases in which sinusitis recurs more than 3 times per year, despite other treatments. HOME CARE INSTRUCTIONS   Drink plenty of water. Water helps thin the mucus so your sinuses  can drain more easily.  Use a humidifier.  Inhale steam 3 to 4 times a day (for example, sit in the bathroom with the shower running).  Apply a warm, moist washcloth to your face 3 to 4 times a day, or as directed by your caregiver.  Use saline nasal sprays to help moisten and clean your sinuses.  Take over-the-counter or prescription medicines for pain, discomfort, or fever only as directed by your caregiver. SEEK IMMEDIATE MEDICAL CARE IF:  You have increasing pain or severe headaches.  You have nausea, vomiting, or drowsiness.  You have swelling around your face.  You have vision problems.  You have a stiff neck.  You have difficulty breathing. MAKE SURE YOU:   Understand these instructions.  Will watch your condition.  Will get help right away if you are not doing well or get worse. Document Released: 03/24/2005 Document Revised: 06/16/2011 Document Reviewed: 04/08/2011 [facility] Patient Information 2014 [place], [place].

## 2013-08-21 ENCOUNTER — Telehealth: Payer: Self-pay

## 2013-08-21 DIAGNOSIS — R059 Cough, unspecified: Secondary | ICD-10-CM

## 2013-08-21 DIAGNOSIS — R05 Cough: Secondary | ICD-10-CM

## 2013-08-21 MED ORDER — AMOXICILLIN-POT CLAVULANATE 400-57 MG PO CHEW: 2.0000 | CHEWABLE_TABLET | Freq: Two times a day (BID) | ORAL | Status: DC

## 2013-08-21 NOTE — Telephone Encounter (Signed)
Stop the pill form of Augmentin and I have sent in a chewable form.

## 2013-08-21 NOTE — Telephone Encounter (Signed)
Pt says that the abx is too big for her to swallow. Please advise. Thanks

## 2013-08-21 NOTE — Telephone Encounter (Signed)
Patient called stated the medication prescribed is too big. Patient like for someone to give her a call about this. [phone]

## 2013-08-21 NOTE — Telephone Encounter (Signed)
Pt.notified

## 2014-08-09 ENCOUNTER — Ambulatory Visit (INDEPENDENT_AMBULATORY_CARE_PROVIDER_SITE_OTHER): Payer: 59 | Admitting: Family Medicine

## 2014-08-09 VITALS — BP 132/82 | HR 91 | Temp 98.0°F | Resp 18 | Ht 67.0 in | Wt 262.2 lb

## 2014-08-09 DIAGNOSIS — A499 Bacterial infection, unspecified: Secondary | ICD-10-CM | POA: Diagnosis not present

## 2014-08-09 DIAGNOSIS — Z124 Encounter for screening for malignant neoplasm of cervix: Secondary | ICD-10-CM

## 2014-08-09 DIAGNOSIS — Z13220 Encounter for screening for lipoid disorders: Secondary | ICD-10-CM | POA: Diagnosis not present

## 2014-08-09 DIAGNOSIS — E785 Hyperlipidemia, unspecified: Secondary | ICD-10-CM

## 2014-08-09 DIAGNOSIS — E119 Type 2 diabetes mellitus without complications: Secondary | ICD-10-CM | POA: Insufficient documentation

## 2014-08-09 DIAGNOSIS — Z1211 Encounter for screening for malignant neoplasm of colon: Secondary | ICD-10-CM

## 2014-08-09 DIAGNOSIS — Z0000 Encounter for general adult medical examination without abnormal findings: Secondary | ICD-10-CM

## 2014-08-09 DIAGNOSIS — Z130 Encounter for screening for diseases of the blood and blood-forming organs and certain disorders involving the immune mechanism: Secondary | ICD-10-CM | POA: Diagnosis not present

## 2014-08-09 DIAGNOSIS — Z23 Encounter for immunization: Secondary | ICD-10-CM

## 2014-08-09 DIAGNOSIS — N760 Acute vaginitis: Secondary | ICD-10-CM | POA: Diagnosis not present

## 2014-08-09 DIAGNOSIS — E669 Obesity, unspecified: Secondary | ICD-10-CM | POA: Diagnosis not present

## 2014-08-09 DIAGNOSIS — E1169 Type 2 diabetes mellitus with other specified complication: Secondary | ICD-10-CM

## 2014-08-09 DIAGNOSIS — B9689 Other specified bacterial agents as the cause of diseases classified elsewhere: Secondary | ICD-10-CM

## 2014-08-09 LAB — COMPREHENSIVE METABOLIC PANEL
ALT: 30 U/L (ref 0–35)
AST: 42 U/L — ABNORMAL HIGH (ref 0–37)
Albumin: 4 g/dL (ref 3.5–5.2)
Alkaline Phosphatase: 103 U/L (ref 39–117)
BILIRUBIN TOTAL: 0.5 mg/dL (ref 0.2–1.2)
BUN: 10 mg/dL (ref 6–23)
CALCIUM: 9.3 mg/dL (ref 8.4–10.5)
CHLORIDE: 102 meq/L (ref 96–112)
CO2: 26 meq/L (ref 19–32)
Creat: 0.6 mg/dL (ref 0.50–1.10)
Glucose, Bld: 111 mg/dL — ABNORMAL HIGH (ref 70–99)
Potassium: 4.2 mEq/L (ref 3.5–5.3)
Sodium: 138 mEq/L (ref 135–145)
Total Protein: 7 g/dL (ref 6.0–8.3)

## 2014-08-09 LAB — CBC
HEMATOCRIT: 48.9 % — AB (ref 36.0–46.0)
HEMOGLOBIN: 17 g/dL — AB (ref 12.0–15.0)
MCH: 31.7 pg (ref 26.0–34.0)
MCHC: 34.8 g/dL (ref 30.0–36.0)
MCV: 91.2 fL (ref 78.0–100.0)
MPV: 11.5 fL (ref 8.6–12.4)
PLATELETS: 155 10*3/uL (ref 150–400)
RBC: 5.36 MIL/uL — AB (ref 3.87–5.11)
RDW: 13.1 % (ref 11.5–15.5)
WBC: 11.1 10*3/uL — ABNORMAL HIGH (ref 4.0–10.5)

## 2014-08-09 LAB — POCT GLYCOSYLATED HEMOGLOBIN (HGB A1C): Hemoglobin A1C: 6.6

## 2014-08-09 LAB — LIPID PANEL
CHOL/HDL RATIO: 7.5 ratio
CHOLESTEROL: 231 mg/dL — AB (ref 0–200)
HDL: 31 mg/dL — ABNORMAL LOW (ref >=46)
LDL CALC: 164 mg/dL — AB (ref 0–99)
Triglycerides: 180 mg/dL — ABNORMAL HIGH (ref <150)
VLDL: 36 mg/dL (ref 0–40)

## 2014-08-09 MED ORDER — METFORMIN HCL 500 MG PO TABS: 500.0000 mg | ORAL_TABLET | Freq: Every day | ORAL | Status: DC

## 2014-08-09 NOTE — Patient Instructions (Addendum)
I will be in touch with your labs asap You got your tetanus shot today Please see if you can find any records about your hysterectomy/ cancer history  Please call and schedule a mammogram.  There are several options in town, one good choice is:  [facility] ?  Address: [address], [place], [place] [address]  [phone]  You also need to schedule a colonoscopy- this can be done with the GI doctor of your choice  The three main options in town are [place] [place].   I would encourage you to quit smoking and try to get some more exercise.  Exercising with your husband would be a great idea.    Your diabetes is not under bad control, but losing weight will also help here.  Start on the metformin 500 mg once a day- probably take it with dinner or before bed.    Let's plan to recheck in 4-5 months.  Happy birthday!

## 2014-08-09 NOTE — Progress Notes (Addendum)
[facility] [address], [place] [address] [phone]  Date:  08/09/2014   Name:  Molly Payne   DOB:  Jul 18, 1960   MRN:  [id number]  PCP:  No PCP Per Patient    Chief Complaint: Annual Exam and Hand Pain   History of Present Illness:  Molly Payne is a 54 y.o. very pleasant female patient who presents with the following:  Here today for a CPE.  History of DM- no recent care in our system. She would like to have a pap today. She has had a partial hyst- she has just one ovary left. 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  She had a pap a few years ago.   She has not had a mammogram in a few years.   She has never had a colonoscopy She is not taking medication for her DM any longer; she cannot remember what she was taking in the past.  She was last on medication a couple of years ago.    She is fasting this am She is a smoker- about 1 ppd.  She has smoked for about 22 years.  She does not drink alcohol   She does occasionally use albuterol for bronchitis but has not needed this of late.    She notes soreness in the dorsal left hand for a couple of weeks. She is not aware of any acute injury.  It does not hurt unless something hits her on the back of the hand or if someone grabs her hand.   It does not hurt if she writes or types.    Lab Results  Component Value Date   HGBA1C 7.2 04/29/2012     Patient Active Problem List   Diagnosis Date Noted  . Smoking 12/23/2012  . Diabetes mellitus 04/29/2012  . Obesity 12/11/2011    Past Medical History  Diagnosis Date  . Diabetes mellitus without complication     Past Surgical History  Procedure Laterality Date  . Appendectomy    . Cholecystectomy    . Abdominal hysterectomy      History  Substance Use Topics  . Smoking status: Current Every Day Smoker -- 1.00 packs/day for 22 years    Types: Cigarettes  .  Smokeless tobacco: Not on file  . Alcohol Use: No    History reviewed. No pertinent family history.  No Known Allergies  Medication list has been reviewed and updated.  Current Outpatient Prescriptions on File Prior to Visit  Medication Sig Dispense Refill  . albuterol (PROVENTIL HFA;VENTOLIN HFA) 108 (90 BASE) MCG/ACT inhaler Inhale 2 puffs into the lungs every 6 (six) hours as needed for wheezing. 1 Inhaler 3   No current facility-administered medications on file prior to visit.    Review of Systems:  As per HPI- otherwise negative.   Physical Examination: Filed Vitals:   08/09/14 0831  BP: 132/82  Pulse: 91  Temp: 98 F (36.7 C)  Resp: 18   Filed Vitals:   08/09/14 0831  Height: 5\' 7"  (1.702 m)  Weight: 262 lb 3.2 oz (118.933 kg)   Body mass index is 41.06 kg/(m^2). Ideal Body Weight: Weight in (lb) to have BMI = 25: 159.3  GEN: WDWN, NAD, Non-toxic, A & O x 3, obese, looks well HEENT: Atraumatic, Normocephalic. Neck supple. No masses, No LAD.  Bilateral TM wnl, oropharynx normal.  PEERL,EOMI.   Ears and Nose: No external deformity. CV: RRR, No M/G/R. No JVD. No thrill. No extra heart sounds. PULM: CTA B, no wheezes, crackles, rhonchi. No retractions. No resp. distress. No accessory muscle use. ABD: S, NT, ND, +BS. No rebound. No HSM. EXTR: No c/c/e NEURO Normal gait.  PSYCH: Normally interactive. Conversant. Not depressed or anxious appearing.  Calm demeanor.  Breast: normal exam, no masses/ dimpling/ discharge Pelvic: normal, no vaginal lesions or discharge. Uterus normal, no CMT, no adnexal tendereness or masses Left hand: she is a bit tender over the 4th .  OW her exam is normal- normal strength, sensation, ROM  Results for orders placed or performed in visit on 08/09/14  POCT glycosylated hemoglobin (Hb A1C)  Result Value Ref Range   Hemoglobin A1C 6.6      Assessment and Plan: Physical exam  Diabetes mellitus type 2 in obese - Plan: POCT  glycosylated hemoglobin (Hb A1C), Comprehensive metabolic panel, Lipid panel, metFORMIN (GLUCOPHAGE) 500 MG tablet  Screening for hyperlipidemia - Plan: Pap IG and HPV (high risk) DNA detection  Screening for cervical cancer  Screening for deficiency anemia - Plan: CBC  Colon cancer screening  Immunization due - Plan: Tdap vaccine greater than or equal to 7yo IM  Encouraged her to work on her weight and to quit smoking. DM which is not under bad control- will start on metformin.   See patient instructions for more details.     Signed Lamar Blinks, MD  Called with her labs on 5/6-  1. Cholesterol is high.  We will start lovastatin, recheck chl in 3-4 months 2. She has BV;  Will treat with flagyl for one week 3. Polycythemia; likely related to smoking. May increase her risk of a clot. Advised to donate blood, work on quitting smoking and recheck in 2 months.   Mailed her a copy of her labs as well  Results for orders placed or performed in visit on 08/09/14  CBC  Result Value Ref Range   WBC 11.1 (H) 4.0 - 10.5 K/uL   RBC 5.36 (H) 3.87 - 5.11 MIL/uL   Hemoglobin 17.0 (H) 12.0 - 15.0 g/dL   HCT 48.9 (H) 36.0 - 46.0 %   MCV 91.2 78.0 - 100.0 fL   MCH 31.7 26.0 - 34.0 pg   MCHC 34.8 30.0 - 36.0 g/dL   RDW 13.1 11.5 - 15.5 %   Platelets 155 150 - 400 K/uL   MPV 11.5 8.6 - 12.4 fL  Comprehensive metabolic panel  Result Value Ref Range   Sodium 138 135 - 145 mEq/L   Potassium 4.2 3.5 - 5.3 mEq/L   Chloride 102 96 - 112 mEq/L   CO2 26 19 - 32 mEq/L   Glucose, Bld 111 (H) 70 - 99 mg/dL   BUN 10 6 - 23 mg/dL   Creat 0.60 0.50 - 1.10 mg/dL   Total Bilirubin 0.5 0.2 - 1.2 mg/dL   Alkaline Phosphatase 103 39 - 117 U/L   AST 42 (H) 0 - 37 U/L   ALT 30 0 - 35 U/L   Total Protein 7.0 6.0 - 8.3 g/dL   Albumin 4.0 3.5 - 5.2 g/dL   Calcium 9.3 8.4 - 10.5 mg/dL  Lipid panel  Result Value Ref Range   Cholesterol 231 (H) 0 - 200 mg/dL   Triglycerides 180 (H) <150 mg/dL   HDL 31  (L) >=46 mg/dL   Total CHOL/HDL Ratio 7.5 Ratio   VLDL 36 0 -  40 mg/dL   LDL Cholesterol 164 (H) 0 - 99 mg/dL  POCT glycosylated hemoglobin (Hb A1C)  Result Value Ref Range   Hemoglobin A1C 6.6   Pap IG and HPV (high risk) DNA detection  Result Value Ref Range   HPV DNA High Risk Not Detected    Specimen adequacy: SEE NOTE    FINAL DIAGNOSIS: SEE NOTE    COMMENTS: SEE NOTE    Cytotechnologist: SEE NOTE

## 2014-08-11 ENCOUNTER — Encounter: Payer: Self-pay | Admitting: Family Medicine

## 2014-08-11 LAB — PAP IG AND HPV HIGH-RISK: HPV DNA HIGH RISK: NOT DETECTED

## 2014-08-11 MED ORDER — METRONIDAZOLE 500 MG PO TABS: 500.0000 mg | ORAL_TABLET | Freq: Two times a day (BID) | ORAL | Status: DC

## 2014-08-11 MED ORDER — LOVASTATIN 20 MG PO TABS: 20.0000 mg | ORAL_TABLET | Freq: Every day | ORAL | Status: DC

## 2014-08-11 NOTE — Addendum Note (Signed)
Addended by: Lamar Blinks C on: 08/11/2014 04:34 PM   Modules accepted: Orders

## 2015-06-01 ENCOUNTER — Ambulatory Visit (INDEPENDENT_AMBULATORY_CARE_PROVIDER_SITE_OTHER): Payer: Managed Care, Other (non HMO) | Admitting: Family Medicine

## 2015-06-01 VITALS — BP 130/82 | HR 87 | Temp 97.9°F | Resp 16 | Ht 66.75 in | Wt 273.0 lb

## 2015-06-01 DIAGNOSIS — J0100 Acute maxillary sinusitis, unspecified: Secondary | ICD-10-CM

## 2015-06-01 MED ORDER — AMOXICILLIN-POT CLAVULANATE 875-125 MG PO TABS
1.0000 | ORAL_TABLET | Freq: Two times a day (BID) | ORAL | Status: DC
Start: 2015-06-01 — End: 2015-06-20

## 2015-06-01 MED ORDER — FLUCONAZOLE 150 MG PO TABS: 150.0000 mg | ORAL_TABLET | Freq: Once | ORAL | Status: DC

## 2015-06-01 NOTE — Progress Notes (Signed)
@UMFCLOGO @  By signing my name below, I, Raven Small, attest that this documentation has been prepared under the direction and in the presence of Robyn Haber, MD.  Electronically Signed: Thea Alken, ED Scribe. 06/01/2015. 8:53 AM.  Patient ID: Molly Payne MRN: [id number], DOB: 20-Jul-1960, 54 y.o. Date of Encounter: 06/01/2015, 8:52 AM  Primary Physician: No PCP Per Patient  Chief Complaint:  Chief Complaint  Patient presents with  . Sinus Problem    facial pain and pressure x 1 week    HPI: 55 y.o. year old female with history below presents with sinus congestion that began 1 week ago with associated maxillary sinus pressure and upper teeth pain. She denies fever, chills and cough.  Pt denies drug allergies.   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       Past Medical History  Diagnosis Date  . Diabetes mellitus without complication ([place])      Home Meds: Prior to Admission medications   Medication Sig Start Date End Date Taking? Authorizing Provider  lovastatin (MEVACOR) 20 MG tablet Take 1 tablet (20 mg total) by mouth at bedtime. 08/11/14  Yes Gay Filler Copland, MD  metFORMIN (GLUCOPHAGE) 500 MG tablet Take 1 tablet (500 mg total) by mouth daily. 08/09/14  Yes Gay Filler Copland, MD  albuterol (PROVENTIL HFA;VENTOLIN HFA) 108 (90 BASE) MCG/ACT inhaler Inhale 2 puffs into the lungs every 6 (six) hours as needed for wheezing. 08/19/13 12/19/14  Robyn Haber, MD  metroNIDAZOLE (FLAGYL) 500 MG tablet Take 1 tablet (500 mg total) by mouth 2 (two) times daily. Take 1 pill twice daily for one week. NO alcohol Patient not taking: Reported on 06/01/2015 08/11/14   Darreld Mclean, MD    Allergies: No Known Allergies  Social History   Social History  . Marital Status: Married    Spouse Name: N/A  . Number of Children: N/A  . Years of Education: N/A   Occupational History  . Not on file.   Social History Main Topics  . Smoking status: Current Every Day Smoker  -- 1.00 packs/day for 22 years    Types: Cigarettes  . Smokeless tobacco: Not on file  . Alcohol Use: No  . Drug Use: No  . Sexual Activity: Not on file   Other Topics Concern  . Not on file   Social History Narrative    Review of Systems: Constitutional: negative for chills, fever, night sweats, weight changes, or fatigue  HEENT: negative for vision changes, hearing loss, , rhinorrhea, or epistaxis. Cardiovascular: negative for chest pain or palpitations Respiratory: negative for hemoptysis, wheezing, shortness of breath, or cough Abdominal: negative for abdominal pain, nausea, vomiting, diarrhea, or constipation Dermatological: negative for rash Neurologic: negative for headache, dizziness, or syncope All other systems reviewed and are otherwise negative with the exception to those above and in the HPI.   Physical Exam: Blood pressure 130/82, pulse 87, temperature 97.9 F (36.6 C), temperature source Oral, resp. rate 16, height 5' 6.75" (1.695 m), weight 273 lb (123.832 kg), SpO2 93 %., Body mass index is 43.1 kg/(m^2). General: Well developed, well nourished, in no acute distress. Head: Normocephalic, atraumatic, eyes without discharge, sclera non-icteric. Bilateral auditory canals clear, TM's are without perforation, pearly grey and translucent with reflective cone of light bilaterally. Oral cavity moist, posterior pharynx without exudate, erythema, peritonsillar abscess, or post nasal drip. Tenderness in maxillary area. Terrible dentition. Mucus purulent discharge with swelling in both passages.  Neck: Supple. No thyromegaly.  Full ROM. No lymphadenopathy.Lungs: Clear bilaterally to auscultation without wheezes, rales, or rhonchi. Breathing is unlabored. Heart: RRR with S1 S2. No murmurs, rubs, or gallops appreciated. Msk:  Strength and tone normal for age. Extremities/Skin: Warm and dry. No clubbing or cyanosis. No edema. No rashes or suspicious lesions. Neuro: Alert and oriented  X 3. Moves all extremities spontaneously. Gait is normal. CNII-XII grossly in tact. Psych:  Responds to questions appropriately with a normal affect.   ASSESSMENT AND PLAN:  55 y.o. year old female with  This chart was scribed in my presence and reviewed by me personally.    ICD-9-CM ICD-10-CM   1. Acute maxillary sinusitis, recurrence not specified 461.0 J01.00 amoxicillin-clavulanate (AUGMENTIN) 875-125 MG tablet      Signed, Robyn Haber, MD 06/01/2015 8:52 AM

## 2015-06-01 NOTE — Patient Instructions (Signed)

## 2015-06-02 ENCOUNTER — Telehealth: Payer: Self-pay

## 2015-06-02 NOTE — Telephone Encounter (Signed)
Pt was prescribed amoxicillin-clavulanate (AUGMENTIN) 875-125 MG tablet [id number]  And she can't swallow the pills, they are too large. She would like something else called in please. Perhaps a liquid. Please call pt back about her options.

## 2015-06-04 NOTE — Telephone Encounter (Signed)
Contacted [facility] who stated that it is fine to cut the tablet in half.  i have contacted the patient and informed to cut the tablet in half at this time and take both pieces.

## 2015-06-04 NOTE — Telephone Encounter (Signed)
Pt called to check on the status of her phone message. Please call pt at [place]

## 2015-06-20 ENCOUNTER — Ambulatory Visit (INDEPENDENT_AMBULATORY_CARE_PROVIDER_SITE_OTHER): Payer: Managed Care, Other (non HMO) | Admitting: Family Medicine

## 2015-06-20 VITALS — BP 132/82 | HR 98 | Temp 97.5°F | Resp 18 | Wt 265.4 lb

## 2015-06-20 DIAGNOSIS — E1169 Type 2 diabetes mellitus with other specified complication: Secondary | ICD-10-CM

## 2015-06-20 DIAGNOSIS — R110 Nausea: Secondary | ICD-10-CM | POA: Diagnosis not present

## 2015-06-20 DIAGNOSIS — E669 Obesity, unspecified: Secondary | ICD-10-CM

## 2015-06-20 DIAGNOSIS — Z8249 Family history of ischemic heart disease and other diseases of the circulatory system: Secondary | ICD-10-CM

## 2015-06-20 DIAGNOSIS — E119 Type 2 diabetes mellitus without complications: Secondary | ICD-10-CM | POA: Diagnosis not present

## 2015-06-20 DIAGNOSIS — Z720 Tobacco use: Secondary | ICD-10-CM | POA: Diagnosis not present

## 2015-06-20 DIAGNOSIS — F17200 Nicotine dependence, unspecified, uncomplicated: Secondary | ICD-10-CM

## 2015-06-20 DIAGNOSIS — E785 Hyperlipidemia, unspecified: Secondary | ICD-10-CM | POA: Diagnosis not present

## 2015-06-20 LAB — COMPLETE METABOLIC PANEL WITH GFR
ALK PHOS: 132 U/L — AB (ref 33–130)
ALT: 34 U/L — ABNORMAL HIGH (ref 6–29)
AST: 52 U/L — ABNORMAL HIGH (ref 10–35)
Albumin: 3.7 g/dL (ref 3.6–5.1)
BUN: 8 mg/dL (ref 7–25)
CALCIUM: 9.4 mg/dL (ref 8.6–10.4)
CHLORIDE: 99 mmol/L (ref 98–110)
CO2: 24 mmol/L (ref 20–31)
CREATININE: 0.67 mg/dL (ref 0.50–1.05)
GFR, Est African American: >89 mL/min (ref >=60)
GFR, Est Non African American: >89 mL/min (ref >=60)
Glucose, Bld: 261 mg/dL — ABNORMAL HIGH (ref 65–99)
Potassium: 4.5 mmol/L (ref 3.5–5.3)
SODIUM: 136 mmol/L (ref 135–146)
Total Bilirubin: 0.6 mg/dL (ref 0.2–1.2)
Total Protein: 6.9 g/dL (ref 6.1–8.1)

## 2015-06-20 LAB — LIPID PANEL
CHOLESTEROL: 189 mg/dL (ref 125–200)
HDL: 25 mg/dL — ABNORMAL LOW (ref >=46)
LDL Cholesterol: 123 mg/dL (ref <130)
Total CHOL/HDL Ratio: 7.6 Ratio — ABNORMAL HIGH (ref <=5.0)
Triglycerides: 203 mg/dL — ABNORMAL HIGH (ref <150)
VLDL: 41 mg/dL — AB (ref <30)

## 2015-06-20 LAB — CBC
HEMATOCRIT: 48.8 % — AB (ref 36.0–46.0)
Hemoglobin: 16.8 g/dL — ABNORMAL HIGH (ref 12.0–15.0)
MCH: 31.9 pg (ref 26.0–34.0)
MCHC: 34.4 g/dL (ref 30.0–36.0)
MCV: 92.6 fL (ref 78.0–100.0)
MPV: 11.8 fL (ref 8.6–12.4)
Platelets: 150 10*3/uL (ref 150–400)
RBC: 5.27 MIL/uL — ABNORMAL HIGH (ref 3.87–5.11)
RDW: 12.6 % (ref 11.5–15.5)
WBC: 11.6 10*3/uL — ABNORMAL HIGH (ref 4.0–10.5)

## 2015-06-20 LAB — HEMOGLOBIN A1C
HEMOGLOBIN A1C: 10.2 % — AB (ref <5.7)
MEAN PLASMA GLUCOSE: 246 mg/dL — AB (ref <117)

## 2015-06-20 LAB — TSH: TSH: 1.52 mIU/L

## 2015-06-20 MED ORDER — LOVASTATIN 20 MG PO TABS: 20.0000 mg | ORAL_TABLET | Freq: Every day | ORAL | Status: DC

## 2015-06-20 MED ORDER — RANITIDINE HCL 150 MG PO TABS: 150.0000 mg | ORAL_TABLET | Freq: Two times a day (BID) | ORAL | Status: DC

## 2015-06-20 MED ORDER — METFORMIN HCL 500 MG PO TABS: 500.0000 mg | ORAL_TABLET | Freq: Every day | ORAL | Status: DC

## 2015-06-20 NOTE — Progress Notes (Signed)
Subjective:    Patient ID: Molly Payne, female    DOB: 16-Jul-1960, 55 y.o.   MRN: [id number]  HPI This is a 55 yo female who presents today with about 2 week history of nausea and constipation. This seemed to follow a course of Augmentin last month. She typically has BM every 2-3 days, lately has had to use Miralax to have some BM. No heart burn symptoms. No urinary frequency, no dysuria, no frequency, no back pain. No blood in stool or dark stools.   Smokes 1 ppd. Her sister died two months ago from sudden heart attack at 94. Was not obese, did not have DM. This has patient concerned about possibility that she has heart problems. She is afraid to have screening colonoscopy due to these concerns. She is overdue on mammogram, but agrees to schedule. She has been compliant with lovastatin and metformin.   Was walking regularly with her husband, but stopped.   Past Medical History  Diagnosis Date  . Diabetes mellitus without complication [facility])    Past Surgical History  Procedure Laterality Date  . Appendectomy    . Cholecystectomy    . Abdominal hysterectomy     History reviewed. No pertinent family history. Social History  Substance Use Topics  . Smoking status: Current Every Day Smoker -- 1.00 packs/day for 22 years    Types: Cigarettes  . Smokeless tobacco: None  . Alcohol Use: No      Review of Systems Per HPI    Objective:   Physical Exam  Constitutional: She is oriented to person, place, and time. She appears well-developed and well-nourished.  Morbidly obese   HENT:  Head: Normocephalic and atraumatic.  Eyes: Conjunctivae are normal.  Neck: Normal range of motion. Neck supple.  Cardiovascular: Normal rate, regular rhythm and normal heart sounds.   Pulmonary/Chest: Effort normal and breath sounds normal.  Abdominal: Soft. Bowel sounds are normal. She exhibits no distension and no mass. There is no tenderness. There is no rebound and no guarding.  Musculoskeletal:  Normal range of motion.  Neurological: She is alert and oriented to person, place, and time.  Skin: Skin is warm and dry.  Psychiatric: She has a normal mood and affect. Her behavior is normal. Judgment and thought content normal.  Vitals reviewed.     BP 132/82 mmHg  Pulse 98  Temp(Src) 97.5 F (36.4 C) (Oral)  Resp 18  Wt 265 lb 6.4 oz (120.385 kg)  SpO2 98% Wt Readings from Last 3 Encounters:  06/20/15 265 lb 6.4 oz (120.385 kg)  06/01/15 273 lb (123.832 kg)  08/09/14 262 lb 3.2 oz (118.933 kg)       Assessment & Plan:  1. Diabetes mellitus type 2 in obese (HCC) - CBC - TSH - Hemoglobin A1c - COMPLETE METABOLIC PANEL WITH GFR - metFORMIN (GLUCOPHAGE) 500 MG tablet; Take 1 tablet (500 mg total) by mouth daily.  Dispense: 90 tablet; Refill: 3 - Ambulatory referral to Cardiology - Lipid panel  2. Obesity - encouraged her to resume walking and make healthy food choices - TSH - Hemoglobin A1c - COMPLETE METABOLIC PANEL WITH GFR - Ambulatory referral to Cardiology  3. Smoking - she has never tried to quit before, encouraged her to consider smoking cessation - CBC - COMPLETE METABOLIC PANEL WITH GFR - Ambulatory referral to Cardiology  4. Nausea without vomiting - suspect this is related to recent augmentin use - ranitidine (ZANTAC) 150 MG tablet; Take 1 tablet (150 mg total)  by mouth 2 (two) times daily.  Dispense: 60 tablet; Refill: 0 - RTC if symptoms worsen  5. Dyslipidemia - lovastatin (MEVACOR) 20 MG tablet; Take 1 tablet (20 mg total) by mouth at bedtime.  Dispense: 90 tablet; Refill: 3 - Ambulatory referral to Cardiology - Lipid panel  6. Family history of premature CAD - Ambulatory referral to Cardiology  7. Constipation - increase fluids, fiber - Miralax 2-3 doses qd, add laxative if no improvement with Miralax alone Clarene Reamer, FNP-BC  [facility]  06/20/2015 8:59 AM

## 2015-06-20 NOTE — Progress Notes (Deleted)
Subjective:     Patient ID: Molly Payne, female   DOB: [birth year], 55 y.o.   MRN: [id number]  HPI   Review of Systems     Objective:   Physical Exam     Assessment:     ***    Plan:     ***

## 2015-06-20 NOTE — Patient Instructions (Signed)
     IF you received an x-ray today, you will receive an invoice from [facility]. Please contact [facility] at [phone] with questions or concerns regarding your invoice.   IF you received labwork today, you will receive an invoice from [facility]. Please contact [facility] at [phone] with questions or concerns regarding your invoice.   Our billing staff will not be able to assist you with questions regarding bills from these companies.  You will be contacted with the lab results as soon as they are available. The fastest way to get your results is to activate your My Chart account. Instructions are located on the last page of this paperwork. If you have not heard from us regarding the results in 2 weeks, please contact this office.     We recommend that you schedule a mammogram for breast cancer screening. Typically, you do not need a referral to do this. Please contact a local imaging center to schedule your mammogram.  [facility] - [phone]  *ask for the Radiology Department [facility]) - [phone] or [phone]  [facility] - [phone] [facility] - [phone] [facility] - [phone]  *ask for the Radiology Department [facility] - [phone]  *ask for the Radiology Department [facility] - [phone]  *ask for the Mammography Department [facility] - [phone]  

## 2015-06-23 MED ORDER — ROSUVASTATIN CALCIUM 20 MG PO TABS: 20.0000 mg | ORAL_TABLET | Freq: Every day | ORAL | Status: DC

## 2015-06-23 MED ORDER — METFORMIN HCL 500 MG PO TABS: 500.0000 mg | ORAL_TABLET | Freq: Two times a day (BID) | ORAL | Status: DC

## 2015-06-23 NOTE — Addendum Note (Signed)
Addended by: Clarene Reamer B on: 06/23/2015 09:07 AM   Modules accepted: Orders

## 2015-06-29 NOTE — Progress Notes (Signed)
     HPI: 55 year old female for evaluation of hyperlipidemia. Lipids 06/20/2015 showed total cholesterol 189, triglycerides 203, HDL 25, LDL 123. Hemoglobin A1c 10.2. TSH normal. SGOT 52 and SGPT 34.  Current Outpatient Prescriptions  Medication Sig Dispense Refill  . albuterol (PROVENTIL HFA;VENTOLIN HFA) 108 (90 BASE) MCG/ACT inhaler Inhale 2 puffs into the lungs every 6 (six) hours as needed for wheezing. 1 Inhaler 3  . aspirin 81 MG tablet Take 81 mg by mouth daily.    Marland Kitchen lovastatin (MEVACOR) 20 MG tablet Take 1 tablet (20 mg total) by mouth at bedtime. 90 tablet 3  . metFORMIN (GLUCOPHAGE) 500 MG tablet Take 1 tablet (500 mg total) by mouth 2 (two) times daily with a meal. 180 tablet 1  . ranitidine (ZANTAC) 150 MG tablet Take 1 tablet (150 mg total) by mouth 2 (two) times daily. 60 tablet 0  . rosuvastatin (CRESTOR) 20 MG tablet Take 1 tablet (20 mg total) by mouth daily. Instead of lovastatin. 90 tablet 3   No current facility-administered medications for this visit.    No Known Allergies   Past Medical History  Diagnosis Date  . Diabetes mellitus without complication [facility])     Past Surgical History  Procedure Laterality Date  . Appendectomy    . Cholecystectomy    . Abdominal hysterectomy      Social History   Social History  . Marital Status: Married    Spouse Name: N/A  . Number of Children: N/A  . Years of Education: N/A   Occupational History  . Not on file.   Social History Main Topics  . Smoking status: Current Every Day Smoker -- 1.00 packs/day for 22 years    Types: Cigarettes  . Smokeless tobacco: Not on file  . Alcohol Use: No  . Drug Use: No  . Sexual Activity: Not on file   Other Topics Concern  . Not on file   Social History Narrative    No family history on file.  ROS: no fevers or chills, productive cough, hemoptysis, dysphasia, odynophagia, melena, hematochezia, dysuria, hematuria, rash, seizure activity, orthopnea, PND, pedal edema,  claudication. Remaining systems are negative.  Physical Exam:   There were no vitals taken for this visit.  General:  Well developed/well nourished in NAD Skin warm/dry Patient not depressed No peripheral clubbing Back-normal HEENT-normal/normal eyelids Neck supple/normal carotid upstroke bilaterally; no bruits; no JVD; no thyromegaly chest - CTA/ normal expansion CV - RRR/normal S1 and S2; no murmurs, rubs or gallops;  PMI nondisplaced Abdomen -NT/ND, no HSM, no mass, + bowel sounds, no bruit 2+ femoral pulses, no bruits Ext-no edema, chords, 2+ DP Neuro-grossly nonfocal  ECG    This encounter was created in error - please disregard.

## 2015-07-01 ENCOUNTER — Ambulatory Visit (INDEPENDENT_AMBULATORY_CARE_PROVIDER_SITE_OTHER): Payer: Managed Care, Other (non HMO) | Admitting: Family Medicine

## 2015-07-01 VITALS — BP 118/78 | HR 89 | Temp 98.0°F | Resp 18 | Ht 66.75 in | Wt 263.6 lb

## 2015-07-01 DIAGNOSIS — R059 Cough, unspecified: Secondary | ICD-10-CM

## 2015-07-01 DIAGNOSIS — R05 Cough: Secondary | ICD-10-CM

## 2015-07-01 DIAGNOSIS — J029 Acute pharyngitis, unspecified: Secondary | ICD-10-CM | POA: Diagnosis not present

## 2015-07-01 DIAGNOSIS — J209 Acute bronchitis, unspecified: Secondary | ICD-10-CM | POA: Diagnosis not present

## 2015-07-01 DIAGNOSIS — F17200 Nicotine dependence, unspecified, uncomplicated: Secondary | ICD-10-CM | POA: Diagnosis not present

## 2015-07-01 LAB — POCT RAPID STREP A (OFFICE): Rapid Strep A Screen: NEGATIVE

## 2015-07-01 MED ORDER — AZITHROMYCIN 250 MG PO TABS: ORAL_TABLET | ORAL | Status: DC

## 2015-07-01 MED ORDER — HYDROCODONE-HOMATROPINE 5-1.5 MG/5ML PO SYRP: 5.0000 mL | ORAL_SOLUTION | Freq: Three times a day (TID) | ORAL | Status: DC | PRN

## 2015-07-01 MED ORDER — IPRATROPIUM BROMIDE 0.02 % IN SOLN
0.5000 mg | Freq: Once | RESPIRATORY_TRACT | Status: AC
  Administered 2015-07-01: 0.5 mg via RESPIRATORY_TRACT

## 2015-07-01 MED ORDER — IPRATROPIUM-ALBUTEROL 20-100 MCG/ACT IN AERS: 1.0000 | INHALATION_SPRAY | Freq: Four times a day (QID) | RESPIRATORY_TRACT | Status: DC

## 2015-07-01 MED ORDER — ALBUTEROL SULFATE (2.5 MG/3ML) 0.083% IN NEBU
2.5000 mg | INHALATION_SOLUTION | Freq: Once | RESPIRATORY_TRACT | Status: AC
  Administered 2015-07-01: 2.5 mg via RESPIRATORY_TRACT

## 2015-07-01 NOTE — Patient Instructions (Signed)
Smoking Cessation, Tips for Success If you are ready to quit smoking, congratulations! You have chosen to help yourself be healthier. Cigarettes bring nicotine, tar, carbon monoxide, and other irritants into your body. Your lungs, heart, and blood vessels will be able to work better without these poisons. There are many different ways to quit smoking. Nicotine gum, nicotine patches, a nicotine inhaler, or nicotine nasal spray can help with physical craving. Hypnosis, support groups, and medicines help break the habit of smoking. WHAT THINGS CAN I DO TO MAKE QUITTING EASIER?  Here are some tips to help you quit for good:  Pick a date when you will quit smoking completely. Tell all of your friends and family about your plan to quit on that date.  Do not try to slowly cut down on the number of cigarettes you are smoking. Pick a quit date and quit smoking completely starting on that day.  Throw away all cigarettes.   Clean and remove all ashtrays from your home, work, and car.  On a card, write down your reasons for quitting. Carry the card with you and read it when you get the urge to smoke.  Cleanse your body of nicotine. Drink enough water and fluids to keep your urine clear or pale yellow. Do this after quitting to flush the nicotine from your body.  Learn to predict your moods. Do not let a bad situation be your excuse to have a cigarette. Some situations in your life might tempt you into wanting a cigarette.  Never have "just one" cigarette. It leads to wanting another and another. Remind yourself of your decision to quit.  Change habits associated with smoking. If you smoked while driving or when feeling stressed, try other activities to replace smoking. Stand up when drinking your coffee. Brush your teeth after eating. Sit in a different chair when you read the paper. Avoid alcohol while trying to quit, and try to drink fewer caffeinated beverages. Alcohol and caffeine may urge you to  smoke.  Avoid foods and drinks that can trigger a desire to smoke, such as sugary or spicy foods and alcohol.  Ask people who smoke not to smoke around you.  Have something planned to do right after eating or having a cup of coffee. For example, plan to take a walk or exercise.  Try a relaxation exercise to calm you down and decrease your stress. Remember, you may be tense and nervous for the first 2 weeks after you quit, but this will pass.  Find new activities to keep your hands busy. Play with a pen, coin, or rubber band. Doodle or draw things on paper.  Brush your teeth right after eating. This will help cut down on the craving for the taste of tobacco after meals. You can also try mouthwash.   Use oral substitutes in place of cigarettes. Try using lemon drops, carrots, cinnamon sticks, or chewing gum. Keep them handy so they are available when you have the urge to smoke.  When you have the urge to smoke, try deep breathing.  Designate your home as a nonsmoking area.  If you are a heavy smoker, ask your health care provider about a prescription for nicotine chewing gum. It can ease your withdrawal from nicotine.  Reward yourself. Set aside the cigarette money you save and buy yourself something nice.  Look for support from others. Join a support group or smoking cessation program. Ask someone at home or at work to help you with your plan   to quit smoking.  Always ask yourself, "Do I need this cigarette or is this just a reflex?" Tell yourself, "Today, I choose not to smoke," or "I do not want to smoke." You are reminding yourself of your decision to quit.  Do not replace cigarette smoking with electronic cigarettes (commonly called e-cigarettes). The safety of e-cigarettes is unknown, and some may contain harmful chemicals.  If you relapse, do not give up! Plan ahead and think about what you will do the next time you get the urge to smoke. HOW WILL I FEEL WHEN I QUIT SMOKING? You  may have symptoms of withdrawal because your body is used to nicotine (the addictive substance in cigarettes). You may crave cigarettes, be irritable, feel very hungry, cough often, get headaches, or have difficulty concentrating. The withdrawal symptoms are only temporary. They are strongest when you first quit but will go away within 10-14 days. When withdrawal symptoms occur, stay in control. Think about your reasons for quitting. Remind yourself that these are signs that your body is healing and getting used to being without cigarettes. Remember that withdrawal symptoms are easier to treat than the major diseases that smoking can cause.  Even after the withdrawal is over, expect periodic urges to smoke. However, these cravings are generally short lived and will go away whether you smoke or not. Do not smoke! WHAT RESOURCES ARE AVAILABLE TO HELP ME QUIT SMOKING? Your health care provider can direct you to community resources or hospitals for support, which may include:  Group support.  Education.  Hypnosis.  Therapy.   This information is not intended to replace advice given to you by your health care provider. Make sure you discuss any questions you have with your health care provider.   Document Released: 12/21/2003 Document Revised: 04/14/2014 Document Reviewed: 09/09/2012 Elsevier Interactive Patient Education 2016 Elsevier Inc.  Acute Bronchitis Bronchitis is inflammation of the airways that extend from the windpipe into the lungs (bronchi). The inflammation often causes mucus to develop. This leads to a cough, which is the most common symptom of bronchitis.  In acute bronchitis, the condition usually develops suddenly and goes away over time, usually in a couple weeks. Smoking, allergies, and asthma can make bronchitis worse. Repeated episodes of bronchitis may cause further lung problems.  CAUSES Acute bronchitis is most often caused by the same virus that causes a cold. The virus  can spread from person to person (contagious) through coughing, sneezing, and touching contaminated objects. SIGNS AND SYMPTOMS   Cough.   Fever.   Coughing up mucus.   Body aches.   Chest congestion.   Chills.   Shortness of breath.   Sore throat.  DIAGNOSIS  Acute bronchitis is usually diagnosed through a physical exam. Your health care provider will also ask you questions about your medical history. Tests, such as chest X-rays, are sometimes done to rule out other conditions.  TREATMENT  Acute bronchitis usually goes away in a couple weeks. Oftentimes, no medical treatment is necessary. Medicines are sometimes given for relief of fever or cough. Antibiotic medicines are usually not needed but may be prescribed in certain situations. In some cases, an inhaler may be recommended to help reduce shortness of breath and control the cough. A cool mist vaporizer may also be used to help thin bronchial secretions and make it easier to clear the chest.  HOME CARE INSTRUCTIONS  Get plenty of rest.   Drink enough fluids to keep your urine clear or pale yellow (unless  you have a medical condition that requires fluid restriction). Increasing fluids may help thin your respiratory secretions (sputum) and reduce chest congestion, and it will prevent dehydration.   Take medicines only as directed by your health care provider.  If you were prescribed an antibiotic medicine, finish it all even if you start to feel better.  Avoid smoking and secondhand smoke. Exposure to cigarette smoke or irritating chemicals will make bronchitis worse. If you are a smoker, consider using nicotine gum or skin patches to help control withdrawal symptoms. Quitting smoking will help your lungs heal faster.   Reduce the chances of another bout of acute bronchitis by washing your hands frequently, avoiding people with cold symptoms, and trying not to touch your hands to your mouth, nose, or eyes.   Keep  all follow-up visits as directed by your health care provider.  SEEK MEDICAL CARE IF: Your symptoms do not improve after 1 week of treatment.  SEEK IMMEDIATE MEDICAL CARE IF:  You develop an increased fever or chills.   You have chest pain.   You have severe shortness of breath.  You have bloody sputum.   You develop dehydration.  You faint or repeatedly feel like you are going to pass out.  You develop repeated vomiting.  You develop a severe headache. MAKE SURE YOU:   Understand these instructions.  Will watch your condition.  Will get help right away if you are not doing well or get worse.   This information is not intended to replace advice given to you by your health care provider. Make sure you discuss any questions you have with your health care provider.   Document Released: 05/01/2004 Document Revised: 04/14/2014 Document Reviewed: 09/14/2012 Elsevier Interactive Patient Education [employer].

## 2015-07-01 NOTE — Progress Notes (Signed)
Subjective:    Patient ID: Molly Payne, female    DOB: 03-27-1961, 55 y.o.   MRN: [id number] By signing my name below, I, Judithe Modest, attest that this documentation has been prepared under the direction and in the presence of Delman Cheadle, MD. Electronically Signed: Judithe Modest, ER Scribe. 07/01/2015. 9:50 AM.  Chief Complaint  Patient presents with  . Cough    since last Wednesday  . Fever    since last Wednesday    HPI HPI Comments: Molly Payne is a 55 y.o. female who presents to [facility] complaining of intermittent fever, fatigue, SOB and cough productive of clear discharge for the last four days. She smokes one PPD. She has taken ibuprofen OTC. She has not been checking her blood sugar. She has used an inhaler in the past.   She was seen one month ago for a sinusitis, and was treated with Augmentin. She was then seen two weeks ago for one week of abdominal pain that was presumed to be secondary to Augmentin. Pt was recommended to use miralax and fiber pills.    Past Medical History  Diagnosis Date  . Diabetes mellitus without complication ([place])    No Known Allergies   Current Outpatient Prescriptions on File Prior to Visit  Medication Sig Dispense Refill  . aspirin 81 MG tablet Take 81 mg by mouth daily.    Marland Kitchen lovastatin (MEVACOR) 20 MG tablet Take 1 tablet (20 mg total) by mouth at bedtime. 90 tablet 3  . metFORMIN (GLUCOPHAGE) 500 MG tablet Take 1 tablet (500 mg total) by mouth 2 (two) times daily with a meal. 180 tablet 1  . ranitidine (ZANTAC) 150 MG tablet Take 1 tablet (150 mg total) by mouth 2 (two) times daily. 60 tablet 0  . rosuvastatin (CRESTOR) 20 MG tablet Take 1 tablet (20 mg total) by mouth daily. Instead of lovastatin. 90 tablet 3  . albuterol (PROVENTIL HFA;VENTOLIN HFA) 108 (90 BASE) MCG/ACT inhaler Inhale 2 puffs into the lungs every 6 (six) hours as needed for wheezing. 1 Inhaler 3   No current facility-administered medications on file prior to  visit.    Review of Systems  Constitutional: Positive for fever, activity change, appetite change and fatigue. Negative for chills.  HENT: Positive for sinus pressure. Negative for congestion, postnasal drip and rhinorrhea.   Respiratory: Positive for cough and shortness of breath.   Gastrointestinal: Positive for constipation. Negative for nausea, vomiting, abdominal pain and diarrhea.  Endocrine: Negative for polydipsia.  Psychiatric/Behavioral: Positive for sleep disturbance.      Objective:  BP 118/78 mmHg  Pulse 89  Temp(Src) 98 F (36.7 C) (Oral)  Resp 18  Ht 5' 6.75" (1.695 m)  Wt 263 lb 9.6 oz (119.568 kg)  BMI 41.62 kg/m2  SpO2 94%  Physical Exam  Constitutional: She is oriented to person, place, and time. She appears well-developed and well-nourished. No distress.  HENT:  Head: Normocephalic and atraumatic.  TMs and Nares normal. Oropharynx with bright red erythema and few small vesicles. Swollen tonsillar nodes. Swollen cervical nodes, left greater than right.   Eyes: Pupils are equal, round, and reactive to light.  Neck: Neck supple.  Cardiovascular: Normal rate, regular rhythm, S1 normal, S2 normal and normal heart sounds.   No murmur heard. Pulmonary/Chest: Effort normal. No respiratory distress.  Lungs clear, but decreased breath sounds throughout.   Musculoskeletal: Normal range of motion.  Neurological: She is alert and oriented to person, place, and time.  Coordination normal.  Skin: Skin is warm and dry. She is not diaphoretic.  Psychiatric: She has a normal mood and affect. Her behavior is normal.  Nursing note and vitals reviewed.     Results for orders placed or performed in visit on 07/01/15  Culture, Group A Strep  Result Value Ref Range   Organism ID, Bacteria Few STREPTOCOCCUS BETA HEMOLYTIC NOT GROUP A   POCT rapid strep A  Result Value Ref Range   Rapid Strep A Screen Negative Negative    Assessment & Plan:   1. Cough   2. Tobacco use  disorder   3. Acute pharyngitis, unspecified etiology   4. Acute bronchitis, unspecified organism     Orders Placed This Encounter  Procedures  . Culture, Group A Strep    Order Specific Question:  Source    Answer:  throat  . POCT rapid strep A    Meds ordered this encounter  Medications  . albuterol (PROVENTIL) (2.5 MG/3ML) 0.083% nebulizer solution 2.5 mg    Sig:   . ipratropium (ATROVENT) nebulizer solution 0.5 mg    Sig:   . Ipratropium-Albuterol (COMBIVENT) 20-100 MCG/ACT AERS respimat    Sig: Inhale 1 puff into the lungs every 6 (six) hours.    Dispense:  4 g    Refill:  0  . HYDROcodone-homatropine (HYCODAN) 5-1.5 MG/5ML syrup    Sig: Take 5 mLs by mouth every 8 (eight) hours as needed for cough.    Dispense:  120 mL    Refill:  0  . azithromycin (ZITHROMAX Z-PAK) 250 MG tablet    Sig: Take 2 tabs po x 1 now, then 1 tabs po qd d2-5    Dispense:  6 each    Refill:  0    I personally performed the services described in this documentation, which was scribed in my presence. The recorded information has been reviewed and considered, and addended by me as needed.  Delman Cheadle, MD MPH

## 2015-07-03 LAB — CULTURE, GROUP A STREP

## 2015-07-04 ENCOUNTER — Ambulatory Visit (INDEPENDENT_AMBULATORY_CARE_PROVIDER_SITE_OTHER): Payer: Managed Care, Other (non HMO)

## 2015-07-04 ENCOUNTER — Ambulatory Visit (INDEPENDENT_AMBULATORY_CARE_PROVIDER_SITE_OTHER): Payer: Managed Care, Other (non HMO) | Admitting: Urgent Care

## 2015-07-04 VITALS — BP 116/82 | HR 98 | Temp 98.3°F | Resp 16 | Ht 66.75 in | Wt 262.8 lb

## 2015-07-04 DIAGNOSIS — E119 Type 2 diabetes mellitus without complications: Secondary | ICD-10-CM

## 2015-07-04 DIAGNOSIS — F17200 Nicotine dependence, unspecified, uncomplicated: Secondary | ICD-10-CM | POA: Diagnosis not present

## 2015-07-04 DIAGNOSIS — E1169 Type 2 diabetes mellitus with other specified complication: Secondary | ICD-10-CM

## 2015-07-04 DIAGNOSIS — J189 Pneumonia, unspecified organism: Secondary | ICD-10-CM

## 2015-07-04 DIAGNOSIS — E669 Obesity, unspecified: Secondary | ICD-10-CM | POA: Diagnosis not present

## 2015-07-04 DIAGNOSIS — R059 Cough, unspecified: Secondary | ICD-10-CM

## 2015-07-04 DIAGNOSIS — J209 Acute bronchitis, unspecified: Secondary | ICD-10-CM | POA: Diagnosis not present

## 2015-07-04 DIAGNOSIS — R05 Cough: Secondary | ICD-10-CM

## 2015-07-04 MED ORDER — LISINOPRIL 5 MG PO TABS: 5.0000 mg | ORAL_TABLET | Freq: Every day | ORAL | Status: DC

## 2015-07-04 MED ORDER — GLIPIZIDE 5 MG PO TABS: 5.0000 mg | ORAL_TABLET | Freq: Two times a day (BID) | ORAL | Status: DC

## 2015-07-04 NOTE — Patient Instructions (Addendum)
Metformin Week 1: increase dose to 2 pills in the morning and 1 pill at night. Week 2: increase dose to 2 pills twice a day.  Glipizide Week 1: start with 1 pill at night before you eat. Week 2: increase to 1 pill twice daily before meals.   IF you received an x-ray today, you will receive an invoice from [facility]. Please contact [facility] at [phone] with questions or concerns regarding your invoice.   IF you received labwork today, you will receive an invoice from [employer]. Please contact [facility] at [phone] with questions or concerns regarding your invoice.   Our billing staff will not be able to assist you with questions regarding bills from these companies.  You will be contacted with the lab results as soon as they are available. The fastest way to get your results is to activate your My Chart account. Instructions are located on the last page of this paperwork. If you have not heard from [place] regarding the results in 2 weeks, please contact this office.     Diabetes Mellitus and Food It is important for you to manage your blood sugar (glucose) level. Your blood glucose level can be greatly affected by what you eat. Eating healthier foods in the appropriate amounts throughout the day at about the same time each day will help you control your blood glucose level. It can also help slow or prevent worsening of your diabetes mellitus. Healthy eating may even help you improve the level of your blood pressure and reach or maintain a healthy weight.  General recommendations for healthful eating and cooking habits include:  Eating meals and snacks regularly. Avoid going long periods of time without eating to lose weight.  Eating a diet that consists mainly of plant-based foods, such as fruits, vegetables, nuts, legumes, and whole grains.  Using low-heat cooking methods, such as baking, instead of high-heat cooking methods, such as  deep frying. Work with your dietitian to make sure you understand how to use the Nutrition Facts information on food labels. HOW CAN FOOD AFFECT ME? Carbohydrates Carbohydrates affect your blood glucose level more than any other type of food. Your dietitian will help you determine how many carbohydrates to eat at each meal and teach you how to count carbohydrates. Counting carbohydrates is important to keep your blood glucose at a healthy level, especially if you are using insulin or taking certain medicines for diabetes mellitus. Alcohol Alcohol can cause sudden decreases in blood glucose (hypoglycemia), especially if you use insulin or take certain medicines for diabetes mellitus. Hypoglycemia can be a life-threatening condition. Symptoms of hypoglycemia (sleepiness, dizziness, and disorientation) are similar to symptoms of having too much alcohol.  If your health care provider has given you approval to drink alcohol, do so in moderation and use the following guidelines:  Women should not have more than one drink per day, and men should not have more than two drinks per day. One drink is equal to:  12 oz of beer.  5 oz of wine.  1 oz of hard liquor.  Do not drink on an empty stomach.  Keep yourself hydrated. Have water, diet soda, or unsweetened iced tea.  Regular soda, juice, and other mixers might contain a lot of carbohydrates and should be counted. WHAT FOODS ARE NOT RECOMMENDED? As you make food choices, it is important to remember that all foods are not the same. Some foods have fewer nutrients per serving than other foods, even though  they might have the same number of calories or carbohydrates. It is difficult to get your body what it needs when you eat foods with fewer nutrients. Examples of foods that you should avoid that are high in calories and carbohydrates but low in nutrients include:  Trans fats (most processed foods list trans fats on the Nutrition Facts  label).  Regular soda.  Juice.  Candy.  Sweets, such as cake, pie, doughnuts, and cookies.  Fried foods. WHAT FOODS CAN I EAT? Eat nutrient-rich foods, which will nourish your body and keep you healthy. The food you should eat also will depend on several factors, including:  The calories you need.  The medicines you take.  Your weight.  Your blood glucose level.  Your blood pressure level.  Your cholesterol level. You should eat a variety of foods, including:  Protein.  Lean cuts of meat.  Proteins low in saturated fats, such as fish, egg whites, and beans. Avoid processed meats.  Fruits and vegetables.  Fruits and vegetables that may help control blood glucose levels, such as apples, mangoes, and yams.  Dairy products.  Choose fat-free or low-fat dairy products, such as milk, yogurt, and cheese.  Grains, bread, pasta, and rice.  Choose whole grain products, such as multigrain bread, whole oats, and brown rice. These foods may help control blood pressure.  Fats.  Foods containing healthful fats, such as nuts, avocado, olive oil, canola oil, and fish. DOES EVERYONE WITH DIABETES MELLITUS HAVE THE SAME MEAL PLAN? Because every person with diabetes mellitus is different, there is not one meal plan that works for everyone. It is very important that you meet with a dietitian who will help you create a meal plan that is just right for you.   This information is not intended to replace advice given to you by your health care provider. Make sure you discuss any questions you have with your health care provider.   Document Released: 12/19/2004 Document Revised: 04/14/2014 Document Reviewed: 02/18/2013 Elsevier Interactive Patient Education [employer].

## 2015-07-04 NOTE — Progress Notes (Signed)
MRN: [id number] DOB: [birth year]  Subjective:   Molly Payne is a 55 y.o. female presenting for chief complaint of Follow-up and Fever  Bronchitis - Patient was seen 07/01/2015, started on azithromycin for bronchitis. Advised to use inhaler 4 times daily. She has since had improvement but still has subjective fever. Denies chest pain, shob, wheezing, sore throat. Cough is not bothering patient. She still has 1 more dose of azithromycin. Still smoking 1 ppd for >20 years. Plans on quitting.  Diabetes -  Currently on 500mg  metformin BID. Her last a1c was 10.2. Has not made significant dietary changes. Patient thinks she is pre-diabetic. No exercise.  Molly has a current medication list which includes the following prescription(s): aspirin, azithromycin, ipratropium-albuterol, metformin, and rosuvastatin. Also has No Known Allergies.  Payne  has a past medical history of Diabetes mellitus without complication ([place]). Also  has past surgical history that includes Appendectomy; Cholecystectomy; and Abdominal hysterectomy.  Objective:   Vitals: BP 116/82 mmHg  Pulse 98  Temp(Src) 98.3 F (36.8 C) (Oral)  Resp 16  Ht 5' 6.75" (1.695 m)  Wt 262 lb 12.8 oz (119.205 kg)  BMI 41.49 kg/m2  SpO2 95%  Physical Exam  Constitutional: She is oriented to person, place, and time. She appears well-developed and well-nourished.  HENT:  Mouth/Throat: Oropharynx is clear and moist.  Dentition with plaque and several carries.  Cardiovascular: Normal rate, regular rhythm and intact distal pulses.  Exam reveals no gallop and no friction rub.   No murmur heard. Pulmonary/Chest: No respiratory distress. She has wheezes (mild bilateral wheezes on forced expiration). She has no rales.  Neurological: She is alert and oriented to person, place, and time.  Skin: Skin is warm and dry.   Dg Chest 2 View  07/04/2015  CLINICAL DATA:  55 year old female with history of cough. Upper respiratory infection. EXAM:  CHEST  2 VIEW COMPARISON:  Chest x-ray 04/27/2013. FINDINGS: Lung volumes are normal. Subtle opacity in the periphery of the left lung base appreciated only on the frontal projection, obscuring the lateral aspect of the left hemidiaphragm and left costophrenic sulcus, favored to reflect the presence of a small volume of left-sided pleural fluid and potential peripheral airspace consolidation. Right lung is clear. No right pleural effusion. No pneumothorax. No pulmonary nodule or mass noted. Pulmonary vasculature and the cardiomediastinal silhouette are within normal limits. Atherosclerosis in the thoracic aorta. IMPRESSION: 1. Potential area of airspace consolidation in the periphery of the left lower lobe with small left pleural effusion, appreciated only on the frontal projection. These findings are very subtle, but may reflect an early pneumonia. Followup PA and lateral chest X-ray is recommended in 3-4 weeks following trial of antibiotic therapy to ensure resolution and exclude underlying malignancy. 2. Atherosclerosis. Electronically Signed   By: Vinnie Langton M.D.   On: 07/04/2015 08:54    Assessment and Plan :   1. Community acquired pneumonia 2. Cough 3. Tobacco use disorder - Continue azithromycin and inhaler use. Recommended patient rtc in 4 weeks for repeat x-ray. She is to give me a call on Friday to make sure she continues to have improvement, may have patient rtc for re-evaluation or consider starting ciprofloxacin or doxycycline. Consider low dose steroid if no improvement in wheezing. Patient agreed. Counseled on smoking cessation.   4. Diabetes mellitus type 2 in obese (HCC) - Increase Metformin, start glipizide and lisinopril. Recommended significant dietary changes and start exercise. Recheck in 3 months.  Molly Eagles, PA-C [facility] [phone] 07/04/2015 8:47 AM

## 2015-07-05 ENCOUNTER — Encounter: Payer: Self-pay | Admitting: Cardiology

## 2015-07-08 ENCOUNTER — Encounter (HOSPITAL_COMMUNITY): Payer: Self-pay | Admitting: *Deleted

## 2015-07-08 ENCOUNTER — Emergency Department (HOSPITAL_COMMUNITY)
Admission: EM | Admit: 2015-07-08 | Discharge: 2015-07-08 | Disposition: A | Discharge status: Home / Self Care | Payer: Managed Care, Other (non HMO) | Attending: Emergency Medicine | Admitting: Emergency Medicine

## 2015-07-08 DIAGNOSIS — T466X5A Adverse effect of antihyperlipidemic and antiarteriosclerotic drugs, initial encounter: Secondary | ICD-10-CM | POA: Diagnosis not present

## 2015-07-08 DIAGNOSIS — F17210 Nicotine dependence, cigarettes, uncomplicated: Secondary | ICD-10-CM | POA: Diagnosis not present

## 2015-07-08 DIAGNOSIS — Z79899 Other long term (current) drug therapy: Secondary | ICD-10-CM | POA: Diagnosis not present

## 2015-07-08 DIAGNOSIS — E119 Type 2 diabetes mellitus without complications: Secondary | ICD-10-CM | POA: Insufficient documentation

## 2015-07-08 DIAGNOSIS — T383X5A Adverse effect of insulin and oral hypoglycemic [antidiabetic] drugs, initial encounter: Secondary | ICD-10-CM | POA: Diagnosis not present

## 2015-07-08 DIAGNOSIS — R110 Nausea: Secondary | ICD-10-CM | POA: Insufficient documentation

## 2015-07-08 DIAGNOSIS — R140 Abdominal distension (gaseous): Secondary | ICD-10-CM | POA: Diagnosis not present

## 2015-07-08 DIAGNOSIS — T464X5A Adverse effect of angiotensin-converting-enzyme inhibitors, initial encounter: Secondary | ICD-10-CM | POA: Insufficient documentation

## 2015-07-08 DIAGNOSIS — T50905A Adverse effect of unspecified drugs, medicaments and biological substances, initial encounter: Secondary | ICD-10-CM

## 2015-07-08 DIAGNOSIS — Z7982 Long term (current) use of aspirin: Secondary | ICD-10-CM | POA: Insufficient documentation

## 2015-07-08 DIAGNOSIS — Z7984 Long term (current) use of oral hypoglycemic drugs: Secondary | ICD-10-CM | POA: Diagnosis not present

## 2015-07-08 DIAGNOSIS — R5383 Other fatigue: Secondary | ICD-10-CM | POA: Insufficient documentation

## 2015-07-08 LAB — CBC
HEMATOCRIT: 47.7 % — AB (ref 36.0–46.0)
HEMOGLOBIN: 16.1 g/dL — AB (ref 12.0–15.0)
MCH: 31.4 pg (ref 26.0–34.0)
MCHC: 33.8 g/dL (ref 30.0–36.0)
MCV: 93 fL (ref 78.0–100.0)
Platelets: 162 10*3/uL (ref 150–400)
RBC: 5.13 MIL/uL — ABNORMAL HIGH (ref 3.87–5.11)
RDW: 12.6 % (ref 11.5–15.5)
WBC: 10.4 10*3/uL (ref 4.0–10.5)

## 2015-07-08 LAB — URINALYSIS, ROUTINE W REFLEX MICROSCOPIC
Bilirubin Urine: NEGATIVE
Glucose, UA: NEGATIVE mg/dL
Hgb urine dipstick: NEGATIVE
Ketones, ur: NEGATIVE mg/dL
LEUKOCYTES UA: NEGATIVE
NITRITE: NEGATIVE
PH: 6 (ref 5.0–8.0)
Protein, ur: NEGATIVE mg/dL
SPECIFIC GRAVITY, URINE: 1.005 (ref 1.005–1.030)

## 2015-07-08 LAB — COMPREHENSIVE METABOLIC PANEL
ALBUMIN: 3.6 g/dL (ref 3.5–5.0)
ALT: 40 U/L (ref 14–54)
ANION GAP: 10 (ref 5–15)
AST: 62 U/L — ABNORMAL HIGH (ref 15–41)
Alkaline Phosphatase: 101 U/L (ref 38–126)
BILIRUBIN TOTAL: 0.7 mg/dL (ref 0.3–1.2)
BUN: 6 mg/dL (ref 6–20)
CHLORIDE: 103 mmol/L (ref 101–111)
CO2: 26 mmol/L (ref 22–32)
Calcium: 9.7 mg/dL (ref 8.9–10.3)
Creatinine, Ser: 0.76 mg/dL (ref 0.44–1.00)
GFR calc Af Amer: >60 mL/min (ref >60)
GFR calc non Af Amer: >60 mL/min (ref >60)
GLUCOSE: 111 mg/dL — AB (ref 65–99)
POTASSIUM: 4 mmol/L (ref 3.5–5.1)
Sodium: 139 mmol/L (ref 135–145)
TOTAL PROTEIN: 7 g/dL (ref 6.5–8.1)

## 2015-07-08 LAB — CBG MONITORING, ED: Glucose-Capillary: 107 mg/dL — ABNORMAL HIGH (ref 65–99)

## 2015-07-08 MED ORDER — ONDANSETRON 4 MG PO TBDP
4.0000 mg | ORAL_TABLET | Freq: Once | ORAL | Status: AC
  Administered 2015-07-08: 4 mg via ORAL
  Filled 2015-07-08: qty 1

## 2015-07-08 MED ORDER — ONDANSETRON HCL 4 MG PO TABS: 4.0000 mg | ORAL_TABLET | Freq: Four times a day (QID) | ORAL | Status: DC

## 2015-07-08 NOTE — ED Notes (Signed)
Pt states that [place] has been adding medications over the past few weeks and she doesn't feel right. Pt states meds are for diabetes, htn, and high cholesterol. Pt states she has been feeling nauseated and sick feeling.

## 2015-07-08 NOTE — ED Provider Notes (Signed)
CSN: [id number]     Arrival date & time 07/08/15  [id number] History  By signing my name below, I, Irene Pap, attest that this documentation has been prepared under the direction and in the presence of Alfonzo Beers, MD. Electronically Signed: Irene Pap, ED Scribe. 07/08/2015. 3:01 AM.  Chief Complaint  Patient presents with  . Nausea  . Medication Reaction   The history is provided by the patient. No language interpreter was used.   HPI Comments: Molly Payne is a 55 y.o. female with a hx of DM who presents to the Emergency Department complaining of nausea onset one week ago. Pt states that she has had recent increases and changes in her medication which is causing her to "not feel right. I feel bloated." Pt states that the increase of medication was due to her CBG significantly increasing. She is currently on azithromycin and regularly takes Metformin, Glucatrol, Lisinopril, and Crestor. She denies pain, fever or vomiting.  No chest pain.  No abdominal pain   Past Medical History  Diagnosis Date  . Diabetes mellitus without complication [facility])    Past Surgical History  Procedure Laterality Date  . Appendectomy    . Cholecystectomy    . Abdominal hysterectomy     No family history on file. Social History  Substance Use Topics  . Smoking status: Current Every Day Smoker -- 1.00 packs/day for 22 years    Types: Cigarettes  . Smokeless tobacco: None  . Alcohol Use: No   OB History    No data available     Review of Systems  Constitutional: Negative for fever.  Gastrointestinal: Positive for nausea. Negative for vomiting.  All other systems reviewed and are negative.  Allergies  Review of patient's allergies indicates no known allergies.  Home Medications   Prior to Admission medications   Medication Sig Start Date End Date Taking? Authorizing Provider  aspirin 81 MG tablet Take 81 mg by mouth daily.    Historical Provider, MD  azithromycin (ZITHROMAX Z-PAK) 250 MG  tablet Take 2 tabs po x 1 now, then 1 tabs po qd d2-5 07/01/15   Shawnee Knapp, MD  glipiZIDE (GLUCOTROL) 5 MG tablet Take 1 tablet (5 mg total) by mouth 2 (two) times daily before a meal. 07/04/15   Jaynee Eagles, PA-C  Ipratropium-Albuterol (COMBIVENT) 20-100 MCG/ACT AERS respimat Inhale 1 puff into the lungs every 6 (six) hours. 07/01/15   Shawnee Knapp, MD  lisinopril (PRINIVIL,ZESTRIL) 5 MG tablet Take 1 tablet (5 mg total) by mouth daily. 07/04/15   Jaynee Eagles, PA-C  metFORMIN (GLUCOPHAGE) 500 MG tablet Take 1 tablet (500 mg total) by mouth 2 (two) times daily with a meal. 06/23/15   Elby Beck, FNP  ondansetron (ZOFRAN) 4 MG tablet Take 1 tablet (4 mg total) by mouth every 6 (six) hours. 07/08/15   Alfonzo Beers, MD  rosuvastatin (CRESTOR) 20 MG tablet Take 1 tablet (20 mg total) by mouth daily. Instead of lovastatin. 06/23/15   Elby Beck, FNP   BP 134/70 mmHg  Pulse 76  Temp(Src) 97.8 F (36.6 C) (Oral)  Resp 24  SpO2 94%  Vitals reviewed Physical Exam  Physical Examination: General appearance - alert, well appearing, and in no distress Mental status - alert, oriented to person, place, and time Eyes - no conjunctival injection no scleral icterus Mouth - mucous membranes moist, pharynx normal without lesions Chest - clear to auscultation, no wheezes, rales or rhonchi, symmetric air entry Heart -  normal rate, regular rhythm, normal S1, S2, no murmurs, rubs, clicks or gallops Abdomen - soft, nontender, nondistended, no masses or organomegaly, nontender Neurological - alert, oriented, normal speech Extremities - peripheral pulses normal, no pedal edema, no clubbing or cyanosis Skin - normal coloration and turgor, no rashes  ED Course  Procedures (including critical care time) DIAGNOSTIC STUDIES: Oxygen Saturation is 95% on RA, adequate by my interpretation.    COORDINATION OF CARE: 2:59 AM-Discussed treatment plan which includes labs and EKG with pt at bedside and pt agreed to  plan.   Labs Review Labs Reviewed  COMPREHENSIVE METABOLIC PANEL - Abnormal; Notable for the following:    Glucose, Bld 111 (*)    AST 62 (*)    All other components within normal limits  CBC - Abnormal; Notable for the following:    RBC 5.13 (*)    Hemoglobin 16.1 (*)    HCT 47.7 (*)    All other components within normal limits  CBG MONITORING, ED - Abnormal; Notable for the following:    Glucose-Capillary 107 (*)    All other components within normal limits  URINALYSIS, ROUTINE W REFLEX MICROSCOPIC (NOT AT [facility])    Imaging Review No results found. I have personally reviewed and evaluated these images and lab results as part of my medical decision-making.   EKG Interpretation   Date/Time:  Sunday July 08 2015 02:42:30 EDT Ventricular Rate:  75 PR Interval:  159 QRS Duration: 97 QT Interval:  399 QTC Calculation: 446 R Axis:   90 Text Interpretation:  Sinus rhythm Borderline right axis deviation Low  voltage, precordial leads ST elev, probable normal early repol pattern  significant artifact present No old tracing to compare Confirmed by [address] [employer]   MD, [place] [phone]) on 07/08/2015 2:49:47 AM      MDM   Final diagnoses:  Nausea  Medication reaction, initial encounter    Pt presenting with c/o nausea and feeling tired since starting multiple new medications.  Over the past week her metformin has been increased, started on glipizide, cholesterol med, low dose 5mg  of lisinopril.  She has had nausa- but no vomting, no chest or abdominal pain.  tonighth her blood pressure is reassuring, blood glucose is 111.  EKG is reassuring as well.  Doubt any acute emergent process at this time.   Pt agreeable with plan for f/u with PMD in the next week.  Given zofran to help with symptom of nausea.   I personally performed the services described in this documentation, which was scribed in my presence. The recorded information has been reviewed and is accurate.     Alfonzo Beers,  MD 07/08/15 [phone]

## 2015-07-08 NOTE — Discharge Instructions (Signed)
Return to the ED with any concerns including vomiting and not able to keep down liquids, abdominal pain, chest pain, difficulty breathing, fainting, decreased level of alertness/lethargy, or any other alarming symptoms °

## 2015-07-08 NOTE — ED Notes (Signed)
Pt departed in NAD.  

## 2015-08-08 NOTE — Progress Notes (Signed)
     HPI: 55 year old female for evaluation of hyperlipidemia. Patient seen in the emergency room on April 2 with nausea. Urinalysis negative.Creatinine 0.76. SGOT mildly elevated at 62. Hemoglobin 16.1.  Current Outpatient Prescriptions  Medication Sig Dispense Refill  . aspirin 81 MG tablet Take 81 mg by mouth daily.    Marland Kitchen azithromycin (ZITHROMAX Z-PAK) 250 MG tablet Take 2 tabs po x 1 now, then 1 tabs po qd d2-5 6 each 0  . glipiZIDE (GLUCOTROL) 5 MG tablet Take 1 tablet (5 mg total) by mouth 2 (two) times daily before a meal. 60 tablet 3  . Ipratropium-Albuterol (COMBIVENT) 20-100 MCG/ACT AERS respimat Inhale 1 puff into the lungs every 6 (six) hours. 4 g 0  . lisinopril (PRINIVIL,ZESTRIL) 5 MG tablet Take 1 tablet (5 mg total) by mouth daily. 90 tablet 3  . metFORMIN (GLUCOPHAGE) 500 MG tablet Take 1 tablet (500 mg total) by mouth 2 (two) times daily with a meal. 180 tablet 1  . ondansetron (ZOFRAN) 4 MG tablet Take 1 tablet (4 mg total) by mouth every 6 (six) hours. 12 tablet 0  . rosuvastatin (CRESTOR) 20 MG tablet Take 1 tablet (20 mg total) by mouth daily. Instead of lovastatin. 90 tablet 3   No current facility-administered medications for this visit.    No Known Allergies  Past Medical History  Diagnosis Date  . Diabetes mellitus without complication [facility])     Past Surgical History  Procedure Laterality Date  . Appendectomy    . Cholecystectomy    . Abdominal hysterectomy      Social History   Social History  . Marital Status: Married    Spouse Name: N/A  . Number of Children: N/A  . Years of Education: N/A   Occupational History  . Not on file.   Social History Main Topics  . Smoking status: Current Every Day Smoker -- 1.00 packs/day for 22 years    Types: Cigarettes  . Smokeless tobacco: Not on file  . Alcohol Use: No  . Drug Use: No  . Sexual Activity: Not on file   Other Topics Concern  . Not on file   Social History Narrative    No family  history on file.  ROS: no fevers or chills, productive cough, hemoptysis, dysphasia, odynophagia, melena, hematochezia, dysuria, hematuria, rash, seizure activity, orthopnea, PND, pedal edema, claudication. Remaining systems are negative.  Physical Exam:   There were no vitals taken for this visit.  General:  Well developed/well nourished in NAD Skin warm/dry Patient not depressed No peripheral clubbing Back-normal HEENT-normal/normal eyelids Neck supple/normal carotid upstroke bilaterally; no bruits; no JVD; no thyromegaly chest - CTA/ normal expansion CV - RRR/normal S1 and S2; no murmurs, rubs or gallops;  PMI nondisplaced Abdomen -NT/ND, no HSM, no mass, + bowel sounds, no bruit 2+ femoral pulses, no bruits Ext-no edema, chords, 2+ DP Neuro-grossly nonfocal  ECG 07/08/2015-Sinus rhythm with possible early repolarization abnormality.   This encounter was created in error - please disregard.

## 2015-08-13 ENCOUNTER — Encounter: Payer: Self-pay | Admitting: Cardiology

## 2015-09-20 ENCOUNTER — Ambulatory Visit (INDEPENDENT_AMBULATORY_CARE_PROVIDER_SITE_OTHER): Payer: Managed Care, Other (non HMO) | Admitting: Internal Medicine

## 2015-09-20 ENCOUNTER — Encounter: Payer: Self-pay | Admitting: Internal Medicine

## 2015-09-20 VITALS — BP 138/83 | HR 97 | Ht 67.0 in | Wt 264.4 lb

## 2015-09-20 DIAGNOSIS — Z720 Tobacco use: Secondary | ICD-10-CM | POA: Diagnosis not present

## 2015-09-20 DIAGNOSIS — E119 Type 2 diabetes mellitus without complications: Secondary | ICD-10-CM

## 2015-09-20 DIAGNOSIS — F17200 Nicotine dependence, unspecified, uncomplicated: Secondary | ICD-10-CM

## 2015-09-20 DIAGNOSIS — Z8249 Family history of ischemic heart disease and other diseases of the circulatory system: Secondary | ICD-10-CM | POA: Diagnosis not present

## 2015-09-20 DIAGNOSIS — E1169 Type 2 diabetes mellitus with other specified complication: Secondary | ICD-10-CM | POA: Insufficient documentation

## 2015-09-20 DIAGNOSIS — E785 Hyperlipidemia, unspecified: Secondary | ICD-10-CM | POA: Diagnosis not present

## 2015-09-20 DIAGNOSIS — Z136 Encounter for screening for cardiovascular disorders: Secondary | ICD-10-CM

## 2015-09-20 NOTE — Patient Instructions (Signed)
Your physician recommends that you return for lab work FASTING to check cholesterol   Dr. Debara Pickett has ordered a CORONARY CALCIUM SCORE - this is done at [address] floor  Your physician recommends that you schedule a follow-up appointment after your test with Dr. Debara Pickett.

## 2015-09-20 NOTE — Progress Notes (Signed)
OFFICE NOTE  Chief Complaint:  "Concerned whether I have heart disease or not"  Primary Care Physician: No PCP Per Patient  HPI:  Molly Payne is a 55 y.o. female who is very pleasant and presents today for evaluation for possible coronary artery disease. Although she reports being asymptomatic she was concerned recently when her sister who is aged 80 died of an MI which was unexpected. She had another sister who had coronary artery disease at age 92 and her mother who has coronary artery disease and has had stents in her 56s. She is a one pack per day smoker and non-insulin-dependent diabetic, also has a history of dyslipidemia and morbid obesity with a BMI 41. She's been working on weight loss recently and wants to start some more exercise but is concerned about possibly having coronary artery disease and a similar outcome is for family members.  PMHx:  Past Medical History  Diagnosis Date  . Diabetes mellitus without complication [facility])     Past Surgical History  Procedure Laterality Date  . Appendectomy    . Cholecystectomy    . Abdominal hysterectomy      FAMHx:  Family History  Problem Relation Age of Onset  . Heart disease Mother   . Heart disease Sister   . Heart disease Sister   . Heart attack Sister     SOCHx:   reports that she has been smoking Cigarettes.  She has a 22 pack-year smoking history. She does not have any smokeless tobacco history on file. She reports that she does not drink alcohol or use illicit drugs.  ALLERGIES:  No Known Allergies  ROS: Pertinent items noted in HPI and remainder of comprehensive ROS otherwise negative.  HOME MEDS: Current Outpatient Prescriptions  Medication Sig Dispense Refill  . aspirin 81 MG tablet Take 81 mg by mouth daily.    Marland Kitchen glipiZIDE (GLUCOTROL) 5 MG tablet Take 1 tablet (5 mg total) by mouth 2 (two) times daily before a meal. 60 tablet 3  . Ipratropium-Albuterol (COMBIVENT) 20-100 MCG/ACT AERS respimat  Inhale 1 puff into the lungs every 6 (six) hours. (Patient taking differently: Inhale 1 puff into the lungs every 6 (six) hours as needed. ) 4 g 0  . lisinopril (PRINIVIL,ZESTRIL) 5 MG tablet Take 1 tablet (5 mg total) by mouth daily. 90 tablet 3  . metFORMIN (GLUCOPHAGE) 500 MG tablet Take 1 tablet (500 mg total) by mouth 2 (two) times daily with a meal. 180 tablet 1  . rosuvastatin (CRESTOR) 20 MG tablet Take 1 tablet (20 mg total) by mouth daily. Instead of lovastatin. 90 tablet 3   No current facility-administered medications for this visit.    LABS/IMAGING: No results found for this or any previous visit (from the past 48 hour(s)). No results found.  WEIGHTS: Wt Readings from Last 3 Encounters:  09/20/15 264 lb 6.4 oz (119.931 kg)  07/04/15 262 lb 12.8 oz (119.205 kg)  07/01/15 263 lb 9.6 oz (119.568 kg)    VITALS: BP 138/83 mmHg  Pulse 97  Ht 5\' 7"  (1.702 m)  Wt 264 lb 6.4 oz (119.931 kg)  BMI 41.40 kg/m2  EXAM: General appearance: alert, no distress and morbidly obese Neck: no carotid bruit and no JVD Lungs: clear to auscultation bilaterally Heart: regular rate and rhythm, S1, S2 normal, no murmur, click, rub or gallop Abdomen: soft, non-tender; bowel sounds normal; no masses,  no organomegaly Extremities: extremities normal, atraumatic, no cyanosis or edema and varicose veins noted  Pulses: 2+ and symmetric Skin: Skin color, texture, turgor normal. No rashes or lesions Neurologic: Grossly normal Psych: Pleasant  EKG: Normal sinus rhythm at 97  ASSESSMENT: 1. Desire for coronary artery disease screening 2. Dyslipidemia 3. Type 2 diabetes 4. Strong family history of premature coronary disease 5. Tobacco abuse 6. Morbid obesity  PLAN: 1.   [name] is concerned about her risk for coronary artery disease. Although she is asymptomatic at this time she would like to understand whether she has any premature coronary artery disease. I recommend we update her lipid  profile as she is on Crestor 20 mg daily to see if she needs more aggressive medical therapy. In addition will obtain a coronary artery calcium score to see if there is any evidence of premature coronary artery disease. She may need additional stress testing if there are significant coronary artery calcium, despite the fact that she is asymptomatic by guideline recommendations.  It was a pleasure seeing her in the office today. Follow-up with me in a few weeks.  Pixie Casino, MD, [facility] Attending Cardiologist [place] 09/20/2015, 5:36 PM

## 2015-09-21 ENCOUNTER — Encounter: Payer: Self-pay | Admitting: *Deleted

## 2015-09-24 ENCOUNTER — Encounter: Payer: Self-pay | Admitting: Internal Medicine

## 2015-09-27 ENCOUNTER — Ambulatory Visit (INDEPENDENT_AMBULATORY_CARE_PROVIDER_SITE_OTHER)
Admission: RE | Admit: 2015-09-27 | Discharge: 2015-09-27 | Disposition: A | Discharge status: Home / Self Care | Payer: Self-pay | Source: Ambulatory Visit | Attending: Internal Medicine | Admitting: Internal Medicine

## 2015-09-27 DIAGNOSIS — Z720 Tobacco use: Secondary | ICD-10-CM

## 2015-09-27 DIAGNOSIS — F17200 Nicotine dependence, unspecified, uncomplicated: Secondary | ICD-10-CM

## 2015-09-27 DIAGNOSIS — E785 Hyperlipidemia, unspecified: Secondary | ICD-10-CM

## 2015-09-27 DIAGNOSIS — Z8249 Family history of ischemic heart disease and other diseases of the circulatory system: Secondary | ICD-10-CM

## 2015-09-27 DIAGNOSIS — E119 Type 2 diabetes mellitus without complications: Secondary | ICD-10-CM

## 2015-10-04 ENCOUNTER — Other Ambulatory Visit: Payer: Self-pay | Admitting: *Deleted

## 2015-10-04 DIAGNOSIS — R9389 Abnormal findings on diagnostic imaging of other specified body structures: Secondary | ICD-10-CM

## 2015-10-12 ENCOUNTER — Ambulatory Visit
Admission: RE | Admit: 2015-10-12 | Discharge: 2015-10-12 | Disposition: A | Discharge status: Home / Self Care | Payer: Managed Care, Other (non HMO) | Source: Ambulatory Visit | Attending: Internal Medicine | Admitting: Internal Medicine

## 2015-10-12 DIAGNOSIS — R9389 Abnormal findings on diagnostic imaging of other specified body structures: Secondary | ICD-10-CM

## 2015-10-17 ENCOUNTER — Other Ambulatory Visit: Payer: Self-pay | Admitting: *Deleted

## 2015-10-17 DIAGNOSIS — R932 Abnormal findings on diagnostic imaging of liver and biliary tract: Secondary | ICD-10-CM

## 2015-11-01 ENCOUNTER — Other Ambulatory Visit: Payer: Self-pay | Admitting: Urgent Care

## 2015-11-01 ENCOUNTER — Telehealth: Payer: Self-pay | Admitting: *Deleted

## 2015-11-01 NOTE — Telephone Encounter (Signed)
Phoned in metformin

## 2015-11-05 ENCOUNTER — Ambulatory Visit (INDEPENDENT_AMBULATORY_CARE_PROVIDER_SITE_OTHER): Payer: Managed Care, Other (non HMO) | Admitting: Internal Medicine

## 2015-11-05 ENCOUNTER — Encounter: Payer: Self-pay | Admitting: Internal Medicine

## 2015-11-05 VITALS — BP 111/69 | HR 89 | Ht 67.0 in | Wt 262.0 lb

## 2015-11-05 DIAGNOSIS — E785 Hyperlipidemia, unspecified: Secondary | ICD-10-CM

## 2015-11-05 DIAGNOSIS — R931 Abnormal findings on diagnostic imaging of heart and coronary circulation: Secondary | ICD-10-CM

## 2015-11-05 DIAGNOSIS — Z8249 Family history of ischemic heart disease and other diseases of the circulatory system: Secondary | ICD-10-CM

## 2015-11-05 DIAGNOSIS — E119 Type 2 diabetes mellitus without complications: Secondary | ICD-10-CM

## 2015-11-05 DIAGNOSIS — R938 Abnormal findings on diagnostic imaging of other specified body structures: Secondary | ICD-10-CM | POA: Diagnosis not present

## 2015-11-05 DIAGNOSIS — R05 Cough: Secondary | ICD-10-CM

## 2015-11-05 DIAGNOSIS — R058 Other specified cough: Secondary | ICD-10-CM | POA: Insufficient documentation

## 2015-11-05 DIAGNOSIS — T464X5A Adverse effect of angiotensin-converting-enzyme inhibitors, initial encounter: Secondary | ICD-10-CM

## 2015-11-05 MED ORDER — LOSARTAN POTASSIUM 25 MG PO TABS: 25.0000 mg | ORAL_TABLET | Freq: Every day | ORAL | 2 refills | Status: DC

## 2015-11-05 NOTE — Progress Notes (Signed)
OFFICE NOTE  Chief Complaint:  Follow-up  Primary Care Physician: No PCP Per Patient  HPI:  Molly Payne is a 55 y.o. female who is very pleasant and presents today for evaluation for possible coronary artery disease. Although she reports being asymptomatic she was concerned recently when her sister who is aged 21 died of an MI which was unexpected. She had another sister who had coronary artery disease at age 45 and her mother who has coronary artery disease and has had stents in her 108s. She is a one pack per day smoker and non-insulin-dependent diabetic, also has a history of dyslipidemia and morbid obesity with a BMI 41. She's been working on weight loss recently and wants to start some more exercise but is concerned about possibly having coronary artery disease and a similar outcome is for family members.  11/05/2015  Molly Payne was seen today in follow-up of her coronary artery calcium score. Fortunately this showed very minimal coronary disease with a calcium score of 7. That being said there is coronary disease in her targets for cholesterol are much lower. Were still waiting for the results of her lipid profile. She really is on Crestor. Interestingly her CT scan indicated some possible fatty liver disease and a dedicated ultrasound was performed. This suggests that there is likely some cirrhosis and an MRI was recommended. Rather than ordering this test I like for her to see a gastroenterologist first to see if this additional testing is necessary. We also discussed smoking cessation at length today.  PMHx:  Past Medical History:  Diagnosis Date  . Diabetes mellitus without complication [facility])     Past Surgical History:  Procedure Laterality Date  . ABDOMINAL HYSTERECTOMY    . APPENDECTOMY    . CHOLECYSTECTOMY      FAMHx:  Family History  Problem Relation Age of Onset  . Heart disease Mother   . Heart disease Sister   . Heart disease Sister   . Heart attack Sister       SOCHx:   reports that she has been smoking Cigarettes.  She has a 30.00 pack-year smoking history. She does not have any smokeless tobacco history on file. She reports that she does not drink alcohol or use drugs.  ALLERGIES:  No Known Allergies  ROS: Pertinent items noted in HPI and remainder of comprehensive ROS otherwise negative.  HOME MEDS: Current Outpatient Prescriptions  Medication Sig Dispense Refill  . aspirin 81 MG tablet Take 81 mg by mouth daily.    Marland Kitchen glipiZIDE (GLUCOTROL) 5 MG tablet Take 1 tablet (5 mg total) by mouth 2 (two) times daily before a meal. 60 tablet 3  . Ipratropium-Albuterol (COMBIVENT) 20-100 MCG/ACT AERS respimat Inhale 1 puff into the lungs every 6 (six) hours. (Patient taking differently: Inhale 1 puff into the lungs every 6 (six) hours as needed. ) 4 g 0  . losartan (COZAAR) 25 MG tablet Take 1 tablet (25 mg total) by mouth daily. 90 tablet 2  . metFORMIN (GLUCOPHAGE) 1000 MG tablet Take 1,000 mg by mouth 2 (two) times daily.    . rosuvastatin (CRESTOR) 20 MG tablet Take 1 tablet (20 mg total) by mouth daily. Instead of lovastatin. 90 tablet 3   No current facility-administered medications for this visit.     LABS/IMAGING: No results found for this or any previous visit (from the past 48 hour(s)). No results found.  WEIGHTS: Wt Readings from Last 3 Encounters:  11/05/15 262 lb (118.8 kg)  09/20/15 264 lb 6.4 oz (119.9 kg)  07/04/15 262 lb 12.8 oz (119.2 kg)    VITALS: BP 111/69 (BP Location: Right Arm, Patient Position: Sitting, Cuff Size: Large)   Pulse 89   Ht 5\' 7"  (1.702 m)   Wt 262 lb (118.8 kg)   SpO2 96%   BMI 41.04 kg/m   EXAM: Deferred  EKG: Deferred  ASSESSMENT: 1. CAC score of 7 (2017) 2. Dyslipidemia 3. Type 2 diabetes 4. Strong family history of premature coronary disease 5. Tobacco abuse 6. Morbid obesity 7. Possible NAFLD  PLAN: 1.   Molly Payne has a small amount of coronary artery calcium with a score  of 7. She needs aggressive risk factor modification, weight loss and tobacco cessation. There was evidence for possible fatty liver disease on ultrasound and CT. I referred her to GI for further evaluation. Follow-up with me in 6 months and I'll contact her with the results of her cholesterol test.  Pixie Casino, MD, [facility] Attending Cardiologist [place] [address] [place] Boeding 11/05/2015, 5:18 PM

## 2015-11-05 NOTE — Patient Instructions (Signed)
Medication Instructions:  1. STOP Linsopril  2. START Losartan 25mg ; Take 1 tab by mouth daily.  Labwork: None ordered  Testing/Procedures: None ordered  Follow-Up: Your physician wants you to follow-up in: [address] [place]. You will receive a reminder letter in the mail two months in advance. If you don't receive a letter, please call our office to schedule the follow-up appointment.  Any Other Special Instructions Will Be Listed Below (If Applicable).     If you need a refill on your cardiac medications before your next appointment, please call your pharmacy.

## 2015-11-07 LAB — CARDIO IQ(R) ADVANCED LIPID PANEL
APOLIPOPROTEIN (CARDIO IQ ADV LIPID PANEL): 97 mg/dL (ref 49–103)
CHOLESTEROL/HDL RATIO (CARDIO IQ ADV LIPID PANEL): 5.5 calc — AB (ref <=5.0)
Cholesterol, Total: 153 mg/dL (ref 125–200)
HDL Cholesterol: 28 mg/dL — ABNORMAL LOW (ref >=46)
LDL Large: 4441 nmol/L — ABNORMAL LOW (ref 5038–17886)
LDL MEDIUM: 231 nmol/L (ref 121–397)
LDL PEAK SIZE: 210.9 Angstrom — AB (ref >=218.2)
LDL Particle Number: 1289 nmol/L (ref 1016–2185)
LDL SMALL: 372 nmol/L (ref 115–386)
LDL, Calculated: 93 mg/dL
LIPOPROTEIN (A) (CARDIO IQ ADV LIPID PANEL): 193 nmol/L — AB (ref <75)
NON-HDL CHOLESTEROL (CARDIO IQ ADV LIPID PANEL): 125 mg/dL
TRIGLYCERIDES (CARDIO IQ ADV LIPID PANEL): 159 mg/dL — AB

## 2015-11-09 ENCOUNTER — Telehealth: Payer: Self-pay | Admitting: Internal Medicine

## 2015-11-09 DIAGNOSIS — E785 Hyperlipidemia, unspecified: Secondary | ICD-10-CM

## 2015-11-09 DIAGNOSIS — Z79899 Other long term (current) drug therapy: Secondary | ICD-10-CM

## 2015-11-09 MED ORDER — ROSUVASTATIN CALCIUM 40 MG PO TABS: 40.0000 mg | ORAL_TABLET | Freq: Every day | ORAL | 5 refills | Status: DC

## 2015-11-09 NOTE — Telephone Encounter (Signed)
LM w/lab results - requested call back to confirm receipt of labs & instructions  crestor increased to 40mg  QD CMET ordered for 1 weeks Lipid ordered for 3 months - lab slip mailed to patient.

## 2015-11-09 NOTE — Telephone Encounter (Signed)
Left message to call back  

## 2015-11-09 NOTE — Telephone Encounter (Signed)
Follow up  Pt returning phone call from Nurse.

## 2015-11-23 LAB — LIPID PANEL
CHOL/HDL RATIO: 4.2 ratio (ref <=5.0)
Cholesterol: 123 mg/dL — ABNORMAL LOW (ref 125–200)
HDL: 29 mg/dL — AB (ref >=46)
LDL CALC: 50 mg/dL (ref <130)
Triglycerides: 218 mg/dL — ABNORMAL HIGH (ref <150)
VLDL: 44 mg/dL — AB (ref <30)

## 2015-11-23 LAB — COMPREHENSIVE METABOLIC PANEL
ALT: 33 U/L — AB (ref 6–29)
AST: 56 U/L — ABNORMAL HIGH (ref 10–35)
Albumin: 3.9 g/dL (ref 3.6–5.1)
Alkaline Phosphatase: 101 U/L (ref 33–130)
BUN: 8 mg/dL (ref 7–25)
CHLORIDE: 102 mmol/L (ref 98–110)
CO2: 24 mmol/L (ref 20–31)
Calcium: 9.3 mg/dL (ref 8.6–10.4)
Creat: 0.6 mg/dL (ref 0.50–1.05)
Glucose, Bld: 66 mg/dL (ref 65–99)
POTASSIUM: 4.3 mmol/L (ref 3.5–5.3)
Sodium: 137 mmol/L (ref 135–146)
Total Bilirubin: 0.4 mg/dL (ref 0.2–1.2)
Total Protein: 6.8 g/dL (ref 6.1–8.1)

## 2015-11-27 ENCOUNTER — Telehealth: Payer: Self-pay | Admitting: Internal Medicine

## 2015-11-27 MED ORDER — ROSUVASTATIN CALCIUM 40 MG PO TABS: 40.0000 mg | ORAL_TABLET | Freq: Every day | ORAL | 5 refills | Status: DC

## 2015-11-27 NOTE — Telephone Encounter (Signed)
Rx sent to pharmacy.  Pt made aware.   

## 2015-11-27 NOTE — Telephone Encounter (Signed)
New message        *STAT* If patient is at the pharmacy, call can be transferred to refill team.   1. Which medications need to be refilled? (please list name of each medication and dose if known) rosuvastatin 20mg   2. Which pharmacy/location (including street and city if local pharmacy) is medication to be sent to?  [email or web address]. Do they need a 30 day or 90 day supply? 30 Pt was taking 1 pill a day ----now she is taking 2 pills a day and the presc ran out sooner than expected

## 2016-02-14 ENCOUNTER — Ambulatory Visit (INDEPENDENT_AMBULATORY_CARE_PROVIDER_SITE_OTHER): Payer: Managed Care, Other (non HMO) | Admitting: Family Medicine

## 2016-02-14 VITALS — BP 138/76 | HR 88 | Temp 98.2°F | Resp 16 | Ht 67.0 in | Wt 269.8 lb

## 2016-02-14 DIAGNOSIS — R358 Other polyuria: Secondary | ICD-10-CM | POA: Diagnosis not present

## 2016-02-14 DIAGNOSIS — R300 Dysuria: Secondary | ICD-10-CM | POA: Diagnosis not present

## 2016-02-14 DIAGNOSIS — R3589 Other polyuria: Secondary | ICD-10-CM

## 2016-02-14 LAB — POCT URINALYSIS DIP (MANUAL ENTRY)
Bilirubin, UA: NEGATIVE
Glucose, UA: NEGATIVE
Ketones, POC UA: NEGATIVE
NITRITE UA: POSITIVE — AB
SPEC GRAV UA: 1.01
UROBILINOGEN UA: 1
pH, UA: 6.5

## 2016-02-14 LAB — POC MICROSCOPIC URINALYSIS (UMFC): Mucus: ABSENT

## 2016-02-14 LAB — POCT GLYCOSYLATED HEMOGLOBIN (HGB A1C): Hemoglobin A1C: 6.4

## 2016-02-14 LAB — GLUCOSE, POCT (MANUAL RESULT ENTRY): POC Glucose: 117 mg/dl — AB (ref 70–99)

## 2016-02-14 MED ORDER — CEPHALEXIN 500 MG PO CAPS: 500.0000 mg | ORAL_CAPSULE | Freq: Three times a day (TID) | ORAL | 0 refills | Status: DC

## 2016-02-14 NOTE — Patient Instructions (Signed)
Take the Keflex 3 times a day for the next 7 days. This should clear you urinary tract infection.  Your blood sugar looks great today. This is good news.   Urinary Tract Infection Urinary tract infections (UTIs) can develop anywhere along your urinary tract. Your urinary tract is your body's drainage system for removing wastes and extra water. Your urinary tract includes two kidneys, two ureters, a bladder, and a urethra. Your kidneys are a pair of bean-shaped organs. Each kidney is about the size of your fist. They are located below your ribs, one on each side of your spine. CAUSES Infections are caused by microbes, which are microscopic organisms, including fungi, viruses, and bacteria. These organisms are so small that they can only be seen through a microscope. Bacteria are the microbes that most commonly cause UTIs. SYMPTOMS  Symptoms of UTIs may vary by age and gender of the patient and by the location of the infection. Symptoms in young women typically include a frequent and intense urge to urinate and a painful, burning feeling in the bladder or urethra during urination. Older women and men are more likely to be tired, shaky, and weak and have muscle aches and abdominal pain. A fever may mean the infection is in your kidneys. Other symptoms of a kidney infection include pain in your back or sides below the ribs, nausea, and vomiting. DIAGNOSIS To diagnose a UTI, your caregiver will ask you about your symptoms. Your caregiver will also ask you to provide a urine sample. The urine sample will be tested for bacteria and white blood cells. White blood cells are made by your body to help fight infection. TREATMENT  Typically, UTIs can be treated with medication. Because most UTIs are caused by a bacterial infection, they usually can be treated with the use of antibiotics. The choice of antibiotic and length of treatment depend on your symptoms and the type of bacteria causing your infection. HOME  CARE INSTRUCTIONS  If you were prescribed antibiotics, take them exactly as your caregiver instructs you. Finish the medication even if you feel better after you have only taken some of the medication.  Drink enough water and fluids to keep your urine clear or pale yellow.  Avoid caffeine, tea, and carbonated beverages. They tend to irritate your bladder.  Empty your bladder often. Avoid holding urine for long periods of time.  Empty your bladder before and after sexual intercourse.  After a bowel movement, women should cleanse from front to back. Use each tissue only once. SEEK MEDICAL CARE IF:   You have back pain.  You develop a fever.  Your symptoms do not begin to resolve within 3 days. SEEK IMMEDIATE MEDICAL CARE IF:   You have severe back pain or lower abdominal pain.  You develop chills.  You have nausea or vomiting.  You have continued burning or discomfort with urination. MAKE SURE YOU:   Understand these instructions.  Will watch your condition.  Will get help right away if you are not doing well or get worse.   This information is not intended to replace advice given to you by your health care provider. Make sure you discuss any questions you have with your health care provider.   Document Released: 01/01/2005 Document Revised: 12/13/2014 Document Reviewed: 05/02/2011 Elsevier Interactive Patient Education [employer].

## 2016-02-14 NOTE — Progress Notes (Signed)
Molly Payne is a 55 y.o. female who presents to [facility] today with complaints concerning for UTI:  1.  Concern for UTI: Molly Payne is a 55 y.o. female who complains of urinary frequency, urgency and dysuria x 4 days.  She denies any flank or back pain, fever, chills, vomiting, or abnormal vaginal discharge/itching/bleeding.  Does endorse some suprapubic pressure.  No history of incontinence.  She did start taking over-the-counter Azo for the past several days which helps some of the dysuria but not her other symptoms.  Also has history of diabetes for which she takes glipizide and metformin. States she has not missed any doses. Her last A1c was greater than 10 back in March of this year. Was not rechecked since then. She denies any polyuria or polydipsia. No polyphagia. Nocturia once or twice at night. This is not increased recently with her UTI symptoms.  The following portions of the patient's history were reviewed and updated as appropriate: allergies, current medications, past medical history, family and social history, and problem list.  Patient is a nonsmoker.    Past Medical History:  Diagnosis Date  . Diabetes mellitus without complication [facility])    Past Surgical History:  Procedure Laterality Date  . ABDOMINAL HYSTERECTOMY    . APPENDECTOMY    . CHOLECYSTECTOMY      Medications reviewed. Current Outpatient Prescriptions  Medication Sig Dispense Refill  . aspirin 81 MG tablet Take 81 mg by mouth daily.    Marland Kitchen glipiZIDE (GLUCOTROL) 5 MG tablet Take 1 tablet (5 mg total) by mouth 2 (two) times daily before a meal. 60 tablet 3  . Ipratropium-Albuterol (COMBIVENT) 20-100 MCG/ACT AERS respimat Inhale 1 puff into the lungs every 6 (six) hours. (Patient taking differently: Inhale 1 puff into the lungs every 6 (six) hours as needed. ) 4 g 0  . metFORMIN (GLUCOPHAGE) 1000 MG tablet Take 1,000 mg by mouth 2 (two) times daily.    . rosuvastatin (CRESTOR) 40 MG tablet Take 1 tablet (40 mg  total) by mouth daily. 30 tablet 5  . losartan (COZAAR) 25 MG tablet Take 1 tablet (25 mg total) by mouth daily. 90 tablet 2   No current facility-administered medications for this visit.     ROS as above otherwise neg.       Objective:   Physical Exam BP 138/76 (BP Location: Left Arm, Patient Position: Sitting, Cuff Size: Large)   Pulse 88   Temp 98.2 F (36.8 C) (Oral)   Resp 16   Ht 5\' 7"  (1.702 m)   Wt 269 lb 12.8 oz (122.4 kg)   SpO2 95%   BMI 42.26 kg/m  Gen:  Alert, cooperative patient who appears stated age in no acute distress.  Vital signs reviewed. Mouth: MMM Cardiac:  Regular rate and rhythm without murmur auscultated.  Good S1/S2. Pulm:  Clear to auscultation bilaterally with good air movement.  No wheezes or rales noted.   Abdomen:  Soft/ND/NT.  No CVA tenderness bilaterally   Results for orders placed or performed in visit on 02/14/16  POCT urinalysis dipstick  Result Value Ref Range   Color, UA yellow yellow   Clarity, UA cloudy (A) clear   Glucose, UA negative negative   Bilirubin, UA negative negative   Ketones, POC UA negative negative   Spec Grav, UA 1.010    Blood, UA moderate (A) negative   pH, UA 6.5    Protein Ur, POC trace (A) negative   Urobilinogen, UA  1.0    Nitrite, UA Positive (A) Negative   Leukocytes, UA large (3+) (A) Negative  POCT Microscopic Urinalysis ([facility])  Result Value Ref Range   WBC,UR,HPF,POC Too numerous to count  (A) None WBC/hpf   RBC,UR,HPF,POC Too numerous to count  (A) None RBC/hpf   Bacteria Many (A) None, Too numerous to count   Mucus Absent Absent   Epithelial Cells, UR Per Microscopy None None, Too numerous to count cells/hpf  POCT glycosylated hemoglobin (Hb A1C)  Result Value Ref Range   Hemoglobin A1C 6.4   POCT glucose (manual entry)  Result Value Ref Range   POC Glucose 117 (A) 70 - 99 mg/dl     Imp/Plan: 1.  UTI: - new problem for patient Treatment with keflex x 7 days. Treat based on symptoms and  U/A. Pyridium prn. Call or return to clinic prn if these symptoms worsen or fail to improve as anticipated, or if she begins to have any back pain, fevers, or chills.  2.  DM2:  - glucose normal here - repeat A1C much improved from last visit.   - FU with PCP in 3-6 months for diabetic recheck

## 2016-03-13 ENCOUNTER — Other Ambulatory Visit: Payer: Self-pay | Admitting: Urgent Care

## 2016-03-13 DIAGNOSIS — E1169 Type 2 diabetes mellitus with other specified complication: Secondary | ICD-10-CM

## 2016-03-13 DIAGNOSIS — E669 Obesity, unspecified: Principal | ICD-10-CM

## 2016-03-25 ENCOUNTER — Ambulatory Visit (INDEPENDENT_AMBULATORY_CARE_PROVIDER_SITE_OTHER): Payer: Managed Care, Other (non HMO) | Admitting: Family Medicine

## 2016-03-25 VITALS — BP 130/80 | HR 96 | Temp 98.6°F | Resp 17 | Ht 67.5 in | Wt 273.0 lb

## 2016-03-25 DIAGNOSIS — E669 Obesity, unspecified: Secondary | ICD-10-CM

## 2016-03-25 DIAGNOSIS — R300 Dysuria: Secondary | ICD-10-CM | POA: Diagnosis not present

## 2016-03-25 DIAGNOSIS — E1169 Type 2 diabetes mellitus with other specified complication: Secondary | ICD-10-CM

## 2016-03-25 LAB — POCT URINALYSIS DIP (MANUAL ENTRY)
BILIRUBIN UA: NEGATIVE
BILIRUBIN UA: NEGATIVE
Glucose, UA: NEGATIVE
Nitrite, UA: NEGATIVE
PH UA: 5.5
Protein Ur, POC: NEGATIVE
Urobilinogen, UA: 0.2

## 2016-03-25 MED ORDER — PHENAZOPYRIDINE HCL 100 MG PO TABS: 100.0000 mg | ORAL_TABLET | Freq: Three times a day (TID) | ORAL | 0 refills | Status: AC | PRN

## 2016-03-25 MED ORDER — SULFAMETHOXAZOLE-TRIMETHOPRIM 800-160 MG PO TABS: 1.0000 | ORAL_TABLET | Freq: Two times a day (BID) | ORAL | 0 refills | Status: AC

## 2016-03-25 MED ORDER — GLIPIZIDE 5 MG PO TABS: 5.0000 mg | ORAL_TABLET | Freq: Every day | ORAL | 0 refills | Status: DC

## 2016-03-25 NOTE — Progress Notes (Signed)
Chief Complaint  Patient presents with  . Dysuria    HPI   Urinary Tract Infection: Patient complains of dysuria and urgency She has had symptoms for 3 days. Patient denies back pain, fever and vaginal discharge. Patient does not have a history of recurrent UTI.  Patient does not have a history of pyelonephritis.   Diabetes Mellitus She reports that she is a diabetic and her last a1c was at goal She takes metformin for her diabetes. She only had 2 low sugar episodes with jittery feeling twice in the past month. Lab Results  Component Value Date   HGBA1C 6.4 02/14/2016   Lab Results  Component Value Date   CREATININE 0.60 11/22/2015   Obesity Pt with morbid obesity who has been doing well with diabetic diet She is not exercising regularly She does not have a plan to stay on track for holiday eating  Body mass index is 42.13 kg/m.  Wt Readings from Last 3 Encounters:  03/25/16 273 lb (123.8 kg)  02/14/16 269 lb 12.8 oz (122.4 kg)  11/05/15 262 lb (118.8 kg)     Past Medical History:  Diagnosis Date  . Diabetes mellitus without complication [facility])     Current Outpatient Prescriptions  Medication Sig Dispense Refill  . aspirin 81 MG tablet Take 81 mg by mouth daily.    . cephALEXin (KEFLEX) 500 MG capsule Take 1 capsule (500 mg total) by mouth 3 (three) times daily. 21 capsule 0  . Ipratropium-Albuterol (COMBIVENT) 20-100 MCG/ACT AERS respimat Inhale 1 puff into the lungs every 6 (six) hours. (Patient taking differently: Inhale 1 puff into the lungs every 6 (six) hours as needed. ) 4 g 0  . losartan (COZAAR) 25 MG tablet Take 1 tablet (25 mg total) by mouth daily. 90 tablet 2  . metFORMIN (GLUCOPHAGE) 1000 MG tablet Take 1,000 mg by mouth 2 (two) times daily.    . rosuvastatin (CRESTOR) 40 MG tablet Take 1 tablet (40 mg total) by mouth daily. 30 tablet 5  . glipiZIDE (GLUCOTROL) 5 MG tablet Take 1 tablet (5 mg total) by mouth daily before breakfast. 90 tablet 0  .  phenazopyridine (PYRIDIUM) 100 MG tablet Take 1 tablet (100 mg total) by mouth 3 (three) times daily as needed for pain. 10 tablet 0  . sulfamethoxazole-trimethoprim (BACTRIM DS,SEPTRA DS) 800-160 MG tablet Take 1 tablet by mouth 2 (two) times daily. 6 tablet 0   No current facility-administered medications for this visit.     Allergies: No Known Allergies  Past Surgical History:  Procedure Laterality Date  . ABDOMINAL HYSTERECTOMY    . APPENDECTOMY    . CHOLECYSTECTOMY      Social History   Social History  . Marital status: Married    Spouse name: N/A  . Number of children: 2  . Years of education: 12   Occupational History  . customer service    Social History Main Topics  . Smoking status: Current Every Day Smoker    Packs/day: 1.00    Years: 32.00    Types: Cigarettes  . Smokeless tobacco: Never Used  . Alcohol use No  . Drug use: No  . Sexual activity: No   Other Topics Concern  . None   Social History Narrative   epworth sleepiness scale = 3 (09/20/15)    Review of Systems  Constitutional: Negative for chills and fever.  Gastrointestinal: Negative for abdominal pain, nausea and vomiting.  Genitourinary: Positive for dysuria, frequency and urgency. Negative for flank  pain and hematuria.  Skin: Negative for itching and rash.  Neurological: Negative for dizziness and headaches.    Objective: Vitals:   03/25/16 1720  BP: 130/80  Pulse: 96  Resp: 17  Temp: 98.6 F (37 C)  TempSrc: Oral  SpO2: 96%  Weight: 273 lb (123.8 kg)  Height: 5' 7.5" (1.715 m)    Physical Exam  Constitutional: She appears well-developed and well-nourished.  HENT:  Head: Normocephalic and atraumatic.  Right Ear: External ear normal.  Left Ear: External ear normal.  Cardiovascular: Normal rate, regular rhythm and normal heart sounds.   No murmur heard. Pulmonary/Chest: Effort normal and breath sounds normal. No respiratory distress.  Abdominal: Soft. Bowel sounds are  normal.  No flank pain or suprapubic tenderness   Component     Latest Ref Rng & Units 03/25/2016         6:06 PM  Color, UA     yellow yellow  Clarity, UA     clear clear  Glucose     negative negative  Bilirubin, UA     negative negative  Specific Gravity, UA      <=1.005  RBC, UA     negative trace-intact (A)  pH, UA      5.5  Protein, UA     negative negative  Urobilinogen, UA      0.2  Nitrite, UA     Negative Negative  Leukocytes, UA     Negative small (1+) (A)    Assessment and Plan Rennee was seen today for dysuria.  Diagnoses and all orders for this visit:  Dysuria- pt description of symptoms suggestive of UTI Given her impending 4 hour car ride and diabetic status will treat empirically while awaiting the culture results -     POCT urinalysis dipstick -     Urine culture  Diabetes mellitus type 2 in obese ([place])- bp improved with few episodes of hypoglycemia Will decrease glipizide to once daily Discussed continuing her diabetic diet and following up in 3 months -     glipiZIDE (GLUCOTROL) 5 MG tablet; Take 1 tablet (5 mg total) by mouth daily before breakfast. -     Urine culture  Other orders -     sulfamethoxazole-trimethoprim (BACTRIM DS,SEPTRA DS) 800-160 MG tablet; Take 1 tablet by mouth 2 (two) times daily. -     phenazopyridine (PYRIDIUM) 100 MG tablet; Take 1 tablet (100 mg total) by mouth 3 (three) times daily as needed for pain.   Morbid Obesity Discussed that she should plan for her holiday meals and budget her calories Discussed strategies like boiling and baking breakfast dishes Not eating leftovers and exercising by walking after dinner  Ruth Kovich A Nolon Rod

## 2016-03-25 NOTE — Patient Instructions (Addendum)
     IF you received an x-ray today, you will receive an invoice from [facility]. Please contact [facility] at [phone] with questions or concerns regarding your invoice.   IF you received labwork today, you will receive an invoice from [place]. Please contact [facility] at [phone] with questions or concerns regarding your invoice.   Our billing staff will not be able to assist you with questions regarding bills from these companies.  You will be contacted with the lab results as soon as they are available. The fastest way to get your results is to activate your My Chart account. Instructions are located on the last page of this paperwork. If you have not heard from [place] regarding the results in 2 weeks, please contact this office.      Urinary Tract Infection, Adult Introduction A urinary tract infection (UTI) is an infection of any part of the urinary tract. The urinary tract includes the:  Kidneys.  Ureters.  Bladder.  Urethra. These organs make, store, and get rid of pee (urine) in the body. Follow these instructions at home:  Take over-the-counter and prescription medicines only as told by your doctor.  If you were prescribed an antibiotic medicine, take it as told by your doctor. Do not stop taking the antibiotic even if you start to feel better.  Avoid the following drinks:  Alcohol.  Caffeine.  Tea.  Carbonated drinks.  Drink enough fluid to keep your pee clear or pale yellow.  Keep all follow-up visits as told by your doctor. This is important.  Make sure to:  Empty your bladder often and completely. Do not to hold pee for long periods of time.  Empty your bladder before and after sex.  Wipe from front to back after a bowel movement if you are female. Use each tissue one time when you wipe. Contact a doctor if:  You have back pain.  You have a fever.  You feel sick to your stomach (nauseous).  You throw up  (vomit).  Your symptoms do not get better after 3 days.  Your symptoms go away and then come back. Get help right away if:  You have very bad back pain.  You have very bad lower belly (abdominal) pain.  You are throwing up and cannot keep down any medicines or water. This information is not intended to replace advice given to you by your health care provider. Make sure you discuss any questions you have with your health care provider. Document Released: 09/10/2007 Document Revised: 08/30/2015 Document Reviewed: 02/12/2015  2017 Elsevier

## 2016-03-28 LAB — URINE CULTURE

## 2016-04-15 ENCOUNTER — Ambulatory Visit (INDEPENDENT_AMBULATORY_CARE_PROVIDER_SITE_OTHER): Payer: Managed Care, Other (non HMO) | Admitting: Physician Assistant

## 2016-04-15 ENCOUNTER — Ambulatory Visit (INDEPENDENT_AMBULATORY_CARE_PROVIDER_SITE_OTHER): Payer: Managed Care, Other (non HMO)

## 2016-04-15 VITALS — BP 142/80 | HR 86 | Temp 98.9°F | Resp 18 | Ht 67.0 in | Wt 275.4 lb

## 2016-04-15 DIAGNOSIS — R059 Cough, unspecified: Secondary | ICD-10-CM

## 2016-04-15 DIAGNOSIS — R05 Cough: Secondary | ICD-10-CM

## 2016-04-15 DIAGNOSIS — J3489 Other specified disorders of nose and nasal sinuses: Secondary | ICD-10-CM

## 2016-04-15 MED ORDER — HYDROCOD POLST-CPM POLST ER 10-8 MG/5ML PO SUER: 5.0000 mL | Freq: Two times a day (BID) | ORAL | 0 refills | Status: DC | PRN

## 2016-04-15 MED ORDER — IPRATROPIUM BROMIDE 0.03 % NA SOLN: 2.0000 | Freq: Two times a day (BID) | NASAL | 0 refills | Status: DC

## 2016-04-15 MED ORDER — BENZONATATE 100 MG PO CAPS: 100.0000 mg | ORAL_CAPSULE | Freq: Three times a day (TID) | ORAL | 0 refills | Status: DC | PRN

## 2016-04-15 NOTE — Patient Instructions (Addendum)
-   We will treat this as a respiratory viral infection.  - I recommend you rest, drink plenty of fluids, eat light meals including soups. - Use atrovent nasal spray for runny nose  - You may use cough syrup at night for your cough and sore throat, Tessalon pearls during the day. Be aware that cough syrup can definitely make you drowsy and sleepy so do not drive or operate any heavy machinery if it is affecting you during the day.  - You may also use Tylenol or ibuprofen over-the-counter for your sore throat.  - Please let me know if you are not seeing any improvement or get worse in 5-7 days.    IF you received an x-ray today, you will receive an invoice from [facility]. Please contact [facility] at [phone] with questions or concerns regarding your invoice.   IF you received labwork today, you will receive an invoice from [place]. Please contact [facility] at [phone] with questions or concerns regarding your invoice.   Our billing staff will not be able to assist you with questions regarding bills from these companies.  You will be contacted with the lab results as soon as they are available. The fastest way to get your results is to activate your My Chart account. Instructions are located on the last page of this paperwork. If you have not heard from [place] regarding the results in 2 weeks, please contact this office.

## 2016-04-15 NOTE — Progress Notes (Signed)
MRN: [id number] DOB: 1961-02-20  Subjective:   Molly Payne is a 56 y.o. female presenting for chief complaint of Cough (greenish sputum) and Nasal Congestion .  Reports 5 day history of rhinorrhea, productive cough (no hemoptysis) and wheezing. Has not tried anything for relief. Denies fever, sinus congestion, ear pain, shortness of breath and chest tightness, night sweats, chills, weight loss, nausea, vomiting, abdominal pain and diarrhea. Has had sick contact with coworkers. No history of seasonal allergies,asthma, or COPD. Patient has had the flu shot this season. Current smoker, smokes 1 ppd x 32 years. No alcohol use.  Has history of pneumonia and bronchitis. Denies any other aggravating or relieving factors, no other questions or concerns.  K[place]ree has a current medication list which includes the following prescription(s): aspirin, glipizide, ipratropium-albuterol, losartan, metformin, and rosuvastatin. Also has No Known Allergies.  Molly Payne  has a past medical history of Diabetes mellitus without complication ([place]). Also  has a past surgical history that includes Appendectomy; Cholecystectomy; and Abdominal hysterectomy.   Objective:   Vitals: BP (!) 142/80 (BP Location: Right Arm, Patient Position: Sitting, Cuff Size: Large)   Pulse 86   Temp 98.9 F (37.2 C) (Oral)   Resp 18   Ht 5\' 7"  (1.702 m)   Wt 275 lb 6.4 oz (124.9 kg)   SpO2 94%   BMI 43.13 kg/m   Physical Exam  Constitutional: She is oriented to person, place, and time. She appears well-developed and well-nourished. No distress.  HENT:  Head: Normocephalic and atraumatic.  Right Ear: Tympanic membrane, external ear and ear canal normal.  Left Ear: Tympanic membrane, external ear and ear canal normal.  Nose: Rhinorrhea present. Right sinus exhibits no maxillary sinus tenderness and no frontal sinus tenderness. Left sinus exhibits no maxillary sinus tenderness and no frontal sinus tenderness.  Mouth/Throat: Uvula is  midline and mucous membranes are normal. Posterior oropharyngeal erythema present.  Eyes: Conjunctivae are normal.  Neck: Normal range of motion.  Cardiovascular: Normal rate, regular rhythm and normal heart sounds.   Pulmonary/Chest: Effort normal and breath sounds normal.  Lymphadenopathy:       Head (right side): No submental, no submandibular, no tonsillar, no preauricular, no posterior auricular and no occipital adenopathy present.       Head (left side): No submental, no submandibular, no tonsillar, no preauricular, no posterior auricular and no occipital adenopathy present.    She has no cervical adenopathy.       Right: No supraclavicular adenopathy present.       Left: No supraclavicular adenopathy present.  Neurological: She is alert and oriented to person, place, and time.  Skin: Skin is warm and dry.  Psychiatric: She has a normal mood and affect.  Vitals reviewed.  Dg Chest 2 View  Result Date: 04/15/2016 CLINICAL DATA:  Productive cough for 5 days, smoker EXAM: CHEST  2 VIEW COMPARISON:  07/04/2015 FINDINGS: Cardiomediastinal silhouette is stable. Degenerative changes mid thoracic spine. No infiltrate or pleural effusion. No pulmonary edema. Mild perihilar bronchitic changes. IMPRESSION: No infiltrate or pulmonary edema. Mild perihilar bronchitic changes. Electronically Signed   By: Lahoma Crocker M.D.   On: 04/15/2016 17:02    No results found for this or any previous visit (from the past 24 hour(s)).  Assessment and Plan :  1. Cough Likely viral etiology, will treat symptomatically. Pt instructed to return to clinic if symptoms worsen, do not improve in 5-7 days, or as needed - DG Chest 2 View; Future - chlorpheniramine-HYDROcodone (  TUSSIONEX PENNKINETIC ER) 10-8 MG/5ML SUER; Take 5 mLs by mouth every 12 (twelve) hours as needed for cough.  Dispense: 100 mL; Refill: 0 - benzonatate (TESSALON) 100 MG capsule; Take 1-2 capsules (100-200 mg total) by mouth 3 (three) times daily as  needed for cough.  Dispense: 40 capsule; Refill: 0  2. Rhinorrhea - ipratropium (ATROVENT) 0.03 % nasal spray; Place 2 sprays into both nostrils 2 (two) times daily.  Dispense: 30 mL; Refill: 0  [name], PA-C  [facility] 04/15/2016 5:20 PM

## 2016-05-10 ENCOUNTER — Ambulatory Visit (INDEPENDENT_AMBULATORY_CARE_PROVIDER_SITE_OTHER): Payer: Managed Care, Other (non HMO) | Admitting: Family Medicine

## 2016-05-10 VITALS — BP 128/80 | HR 94 | Temp 98.5°F | Resp 24 | Ht 67.0 in | Wt 276.1 lb

## 2016-05-10 DIAGNOSIS — F17200 Nicotine dependence, unspecified, uncomplicated: Secondary | ICD-10-CM

## 2016-05-10 DIAGNOSIS — J441 Chronic obstructive pulmonary disease with (acute) exacerbation: Secondary | ICD-10-CM

## 2016-05-10 DIAGNOSIS — E669 Obesity, unspecified: Secondary | ICD-10-CM

## 2016-05-10 DIAGNOSIS — E1169 Type 2 diabetes mellitus with other specified complication: Secondary | ICD-10-CM

## 2016-05-10 DIAGNOSIS — J209 Acute bronchitis, unspecified: Secondary | ICD-10-CM | POA: Diagnosis not present

## 2016-05-10 LAB — POCT CBC
GRANULOCYTE PERCENT: 70 % (ref 37–80)
HCT, POC: 43.4 % (ref 37.7–47.9)
HEMOGLOBIN: 15.5 g/dL (ref 12.2–16.2)
Lymph, poc: 2.1 (ref 0.6–3.4)
MCH, POC: 32.3 pg — AB (ref 27–31.2)
MCHC: 35.8 g/dL — AB (ref 31.8–35.4)
MCV: 90.1 fL (ref 80–97)
MID (cbc): 0.3 (ref 0–0.9)
MPV: 8.4 fL (ref 0–99.8)
PLATELET COUNT, POC: 103 10*3/uL — AB (ref 142–424)
POC Granulocyte: 5.5 (ref 2–6.9)
POC LYMPH %: 26.6 % (ref 10–50)
POC MID %: 3.4 %M (ref 0–12)
RBC: 4.81 M/uL (ref 4.04–5.48)
RDW, POC: 13 %
WBC: 7.8 10*3/uL (ref 4.6–10.2)

## 2016-05-10 LAB — GLUCOSE, POCT (MANUAL RESULT ENTRY): POC GLUCOSE: 246 mg/dL — AB (ref 70–99)

## 2016-05-10 MED ORDER — AZITHROMYCIN 250 MG PO TABS: ORAL_TABLET | ORAL | 0 refills | Status: DC

## 2016-05-10 MED ORDER — METHYLPREDNISOLONE ACETATE 80 MG/ML IJ SUSP
40.0000 mg | Freq: Once | INTRAMUSCULAR | Status: AC
  Administered 2016-05-10: 40 mg via INTRAMUSCULAR

## 2016-05-10 MED ORDER — IPRATROPIUM BROMIDE 0.02 % IN SOLN
0.5000 mg | Freq: Once | RESPIRATORY_TRACT | Status: AC
  Administered 2016-05-10: 0.5 mg via RESPIRATORY_TRACT

## 2016-05-10 MED ORDER — BLOOD GLUCOSE MONITOR KIT: PACK | 0 refills | Status: DC

## 2016-05-10 MED ORDER — IPRATROPIUM-ALBUTEROL 20-100 MCG/ACT IN AERS: 1.0000 | INHALATION_SPRAY | Freq: Four times a day (QID) | RESPIRATORY_TRACT | 0 refills | Status: DC

## 2016-05-10 MED ORDER — PROMETHAZINE-CODEINE 6.25-10 MG/5ML PO SYRP: 5.0000 mL | ORAL_SOLUTION | Freq: Four times a day (QID) | ORAL | 0 refills | Status: DC | PRN

## 2016-05-10 MED ORDER — GLIPIZIDE 5 MG PO TABS: 5.0000 mg | ORAL_TABLET | Freq: Two times a day (BID) | ORAL | 0 refills | Status: DC

## 2016-05-10 MED ORDER — ALBUTEROL SULFATE (2.5 MG/3ML) 0.083% IN NEBU
2.5000 mg | INHALATION_SOLUTION | Freq: Once | RESPIRATORY_TRACT | Status: AC
  Administered 2016-05-10: 2.5 mg via RESPIRATORY_TRACT

## 2016-05-10 NOTE — Progress Notes (Signed)
By signing my name below, I, Mesha Guinyard, attest that this documentation has been prepared under the direction and in the presence of Delman Cheadle, MD.  Electronically Signed: Verlee Monte, Medical Scribe. 05/10/16. 8:33 AM.  Subjective:    Patient ID: Molly Payne, female    DOB: 12-Mar-1961, 56 y.o.   MRN: [id number]  HPI Chief Complaint  Patient presents with  . Cough    feels heavy in chest    HPI Comments: Molly Payne is a 56 y.o. female who presents to the [facility] complaining of wet cough with clear sputum onset 5 days ago. She reports associated sxs of wheeze, SOB and has been sleeping in a chair due to her sxs. Pt has used combivent once, and tessalon for little releif of her sxs. Pt had similar sxs a month ago and she felt better within a week. Pt doesn't check her blood sugar at home and smokes 0.5 ppd. Pt has never had to use albuterol at home. Denies fevers, chills, appetite change, nausea, sleep loss, and vomiting.  Patient Active Problem List   Diagnosis Date Noted  . Agatston coronary artery calcium score less than 100 11/05/2015  . Cough due to ACE inhibitor 11/05/2015  . Dyslipidemia 09/20/2015  . Family history of heart disease 09/20/2015  . Smoker 09/20/2015  . Screening for cardiovascular condition 09/20/2015  . Diabetes mellitus type 2 in obese ([place]) 08/09/2014  . Morbid obesity ([place]) 08/09/2014  . Smoking 12/23/2012  . Diabetes mellitus ([place]) 04/29/2012  . Obesity 12/11/2011   Past Medical History:  Diagnosis Date  . Diabetes mellitus without complication [facility])    Past Surgical History:  Procedure Laterality Date  . ABDOMINAL HYSTERECTOMY    . APPENDECTOMY    . CHOLECYSTECTOMY     No Known Allergies Prior to Admission medications   Medication Sig Start Date End Date Taking? Authorizing Provider  aspirin 81 MG tablet Take 81 mg by mouth daily.   Yes Historical Provider, MD  glipiZIDE (GLUCOTROL) 5 MG tablet Take 1 tablet  (5 mg total) by mouth daily before breakfast. 03/25/16  Yes Zoe A Stallings, MD  Ipratropium-Albuterol (COMBIVENT) 20-100 MCG/ACT AERS respimat Inhale 1 puff into the lungs every 6 (six) hours. Patient taking differently: Inhale 1 puff into the lungs every 6 (six) hours as needed.  07/01/15  Yes Shawnee Knapp, MD  losartan (COZAAR) 25 MG tablet Take 1 tablet (25 mg total) by mouth daily. 11/05/15  Yes Pixie Casino, MD  metFORMIN (GLUCOPHAGE) 1000 MG tablet Take 1,000 mg by mouth 2 (two) times daily. 11/01/15  Yes Historical Provider, MD  rosuvastatin (CRESTOR) 40 MG tablet Take 1 tablet (40 mg total) by mouth daily. 11/27/15  Yes Pixie Casino, MD  benzonatate (TESSALON) 100 MG capsule Take 1-2 capsules (100-200 mg total) by mouth 3 (three) times daily as needed for cough. Patient not taking: Reported on 05/10/2016 04/15/16   Leonie Douglas, PA-C  chlorpheniramine-HYDROcodone [facility] ER) 10-8 MG/5ML SUER Take 5 mLs by mouth every 12 (twelve) hours as needed for cough. Patient not taking: Reported on 05/10/2016 04/15/16   Leonie Douglas, PA-C  ipratropium (ATROVENT) 0.03 % nasal spray Place 2 sprays into both nostrils 2 (two) times daily. Patient not taking: Reported on 05/10/2016 04/15/16   Leonie Douglas, PA-C   Social History   Social History  . Marital status: Married    Spouse name: N/A  . Number of children: 2  .  Years of education: 32   Occupational History  . customer service    Social History Main Topics  . Smoking status: Current Every Day Smoker    Packs/day: 1.00    Years: 32.00    Types: Cigarettes  . Smokeless tobacco: Never Used  . Alcohol use No  . Drug use: No  . Sexual activity: No   Other Topics Concern  . Not on file   Social History Narrative   epworth sleepiness scale = 3 (09/20/15)   Review of Systems  Constitutional: Negative for appetite change, chills and fever.  Respiratory: Positive for cough, shortness of breath and wheezing.     Gastrointestinal: Negative for nausea and vomiting.  Psychiatric/Behavioral: Negative for sleep disturbance.   Objective:  Physical Exam  Constitutional: She appears well-developed and well-nourished. No distress.  HENT:  Head: Normocephalic and atraumatic.  Nose: Nose normal.  Nares are nl Dry oral oropharynx mucosal membrane  Eyes: Conjunctivae are normal.  Neck: Neck supple.  Cardiovascular: Normal rate, regular rhythm and normal heart sounds.  Exam reveals no gallop and no friction rub.   No murmur heard. Pulmonary/Chest: Effort normal. No respiratory distress. She has decreased breath sounds (airmovement, throughout). She has no wheezes.  9:02 AM recheck: Mildly improved air movement, but lungs still clear  Lymphadenopathy:       Head (right side): Tonsillar adenopathy present.       Head (left side): Tonsillar adenopathy present.  Neurological: She is alert.  Skin: Skin is warm and dry.  Psychiatric: She has a normal mood and affect. Her behavior is normal.  Nursing note and vitals reviewed.  BP 128/80   Pulse 94   Temp 98.5 F (36.9 C) (Oral)   Resp (!) 24   Ht _0  (1.702 m)   Wt 276 lb 2 oz (125.2 kg)   SpO2 94%   BMI 43.25 kg/m    Results for orders placed or performed in visit on 05/10/16  POCT glucose (manual entry)  Result Value Ref Range   POC Glucose 246 (A) 70 - 99 mg/dl   [9:01 AM] Pt states she feels better after her breathing treatment.  Assessment & Plan:  Increased glipizide backup to BID. Use scheduled QID combivent. Checked CBG resting and fasting, checked 2 hours postprandial, and recheck in 3 days unless distinctly better.  1. Acute bronchitis, unspecified organism   2. COPD exacerbation ([place])   3. Diabetes mellitus type 2 in obese ([place])   4. Tobacco use disorder     Orders Placed This Encounter  Procedures  . POCT CBC  . POCT glucose (manual entry)    Meds ordered this encounter  Medications  . albuterol (PROVENTIL) (2.5 MG/3ML)  0.083% nebulizer solution 2.5 mg  . ipratropium (ATROVENT) nebulizer solution 0.5 mg  . blood glucose meter kit and supplies KIT    Sig: Dispense based on patient and insurance preference. Use up to four times daily as directed. (FOR ICD-10 E11.69).    Dispense:  1 each    Refill:  0    Order Specific Question:   Number of strips    Answer:   1000    Order Specific Question:   Number of lancets    Answer:   1000  . glipiZIDE (GLUCOTROL) 5 MG tablet    Sig: Take 1 tablet (5 mg total) by mouth 2 (two) times daily before a meal.    Dispense:  180 tablet    Refill:  0  Please consider 90 day supplies to promote better adherence  . methylPREDNISolone acetate (DEPO-MEDROL) injection 40 mg  . promethazine-codeine (PHENERGAN WITH CODEINE) 6.25-10 MG/5ML syrup    Sig: Take 5-10 mLs by mouth every 6 (six) hours as needed for cough.    Dispense:  180 mL    Refill:  0  . azithromycin (ZITHROMAX) 250 MG tablet    Sig: Take 2 tabs PO x 1 dose, then 1 tab PO QD x 4 days    Dispense:  6 tablet    Refill:  0  . Ipratropium-Albuterol (COMBIVENT) 20-100 MCG/ACT AERS respimat    Sig: Inhale 1 puff into the lungs 4 (four) times daily.    Dispense:  4 g    Refill:  0    I personally performed the services described in this documentation, which was scribed in my presence. The recorded information has been reviewed and considered, and addended by me as needed.   Delman Cheadle, M.D.  [facility] at [facility] [address] [place], [place] [address] [phone] phone [phone] fax  05/27/16 11:35 PM

## 2016-05-10 NOTE — Progress Notes (Signed)
     IF you received an x-ray today, you will receive an invoice from [facility]. Please contact [facility] at [phone] with questions or concerns regarding your invoice.   IF you received labwork today, you will receive an invoice from [facility]. Please contact [facility] at [phone] with questions or concerns regarding your invoice.   Our billing staff will not be able to assist you with questions regarding bills from these companies.  You will be contacted with the lab results as soon as they are available. The fastest way to get your results is to activate your My Chart account. Instructions are located on the last page of this paperwork. If you have not heard from us regarding the results in 2 weeks, please contact this office.     

## 2016-05-10 NOTE — Patient Instructions (Addendum)
Chronic Obstructive Pulmonary Disease Exacerbation Chronic obstructive pulmonary disease (COPD) is a common lung condition in which airflow from the lungs is limited. COPD is a general term that can be used to describe many different lung problems that limit airflow, including chronic bronchitis and emphysema. COPD exacerbations are episodes when breathing symptoms become much worse and require extra treatment. Without treatment, COPD exacerbations can be life threatening, and frequent COPD exacerbations can cause further damage to your lungs. What are the causes?  Respiratory infections.  Exposure to smoke.  Exposure to air pollution, chemical fumes, or dust. Sometimes there is no apparent cause or trigger. What increases the risk?  Smoking cigarettes.  Older age.  Frequent prior COPD exacerbations. What are the signs or symptoms?  Increased coughing.  Increased thick spit (sputum) production.  Increased wheezing.  Increased shortness of breath.  Rapid breathing.  Chest tightness. How is this diagnosed? Your medical history, a physical exam, and tests will help your health care provider make a diagnosis. Tests may include:  A chest X-ray.  Basic lab tests.  Sputum testing.  An arterial blood gas test. How is this treated? Depending on the severity of your COPD exacerbation, you may need to be admitted to a hospital for treatment. Some of the treatments commonly used to treat COPD exacerbations are:  Antibiotic medicines.  Bronchodilators. These are drugs that expand the air passages. They may be given with an inhaler or nebulizer. Spacer devices may be needed to help improve drug delivery.  Corticosteroid medicines.  Supplemental oxygen therapy.  Airway clearing techniques, such as noninvasive ventilation (NIV) and positive expiratory pressure (PEP). These provide respiratory support through a mask or other noninvasive device. Follow these instructions at  home:  Do not smoke. Quitting smoking is very important to prevent COPD from getting worse and exacerbations from happening as often.  Avoid exposure to all substances that irritate the airway, especially to tobacco smoke.  If you were prescribed an antibiotic medicine, finish it all even if you start to feel better.  Take all medicines as directed by your health care provider.It is important to use correct technique with inhaled medicines.  Drink enough fluids to keep your urine clear or pale yellow (unless you have a medical condition that requires fluid restriction).  Use a cool mist vaporizer. This makes it easier to clear your chest when you cough.  If you have a home nebulizer and oxygen, continue to use them as directed.  Maintain all necessary vaccinations to prevent infections.  Exercise regularly.  Eat a healthy diet.  Keep all follow-up appointments as directed by your health care provider. Get help right away if:  You have worsening shortness of breath.  You have trouble talking.  You have severe chest pain.  You have blood in your sputum.  You have a fever.  You have weakness, vomit repeatedly, or faint.  You feel confused.  You continue to get worse. This information is not intended to replace advice given to you by your health care provider. Make sure you discuss any questions you have with your health care provider. Document Released: 01/19/2007 Document Revised: 08/30/2015 Document Reviewed: 11/26/2012 Elsevier Interactive Patient Education  2017 [employer].  Blood Glucose Monitoring, Adult Monitoring your blood sugar (glucose) helps you manage your diabetes. It also helps you and your health care provider determine how well your diabetes management plan is working. Blood glucose monitoring involves checking your blood glucose as often as directed, and keeping a record (log)  of your results over time. Why should I monitor my blood glucose? Checking  your blood glucose regularly can:  Help you understand how food, exercise, illnesses, and medicines affect your blood glucose.  Let you know what your blood glucose is at any time. You can quickly tell if you are having low blood glucose (hypoglycemia) or high blood glucose (hyperglycemia).  Help you and your health care provider adjust your medicines as needed. When should I check my blood glucose? Follow instructions from your health care provider about how often to check your blood glucose. This may depend on:  The type of diabetes you have.  How well-controlled your diabetes is.  Medicines you are taking. If you have type 1 diabetes:  Check your blood glucose at least 2 times a day.  Also check your blood glucose:  Before every insulin injection.  Before and after exercise.  Between meals.  2 hours after a meal.  Occasionally between 2:00 a.m. and 3:00 a.m., as directed.  Before potentially dangerous tasks, like driving or using heavy machinery.  At bedtime.  You may need to check your blood glucose more often, up to 6-10 times a day:  If you use an insulin pump.  If you need multiple daily injections (MDI).  If your diabetes is not well-controlled.  If you are ill.  If you have a history of severe hypoglycemia.  If you have a history of not knowing when your blood glucose is getting low (hypoglycemia unawareness). If you have type 2 diabetes:  If you take insulin or other diabetes medicines, check your blood glucose at least 2 times a day.  If you are on intensive insulin therapy, check your blood glucose at least 4 times a day. Occasionally, you may also need to check between 2:00 a.m. and 3:00 a.m., as directed.  Also check your blood glucose:  Before and after exercise.  Before potentially dangerous tasks, like driving or using heavy machinery.  You may need to check your blood glucose more often if:  Your medicine is being adjusted.  Your  diabetes is not well-controlled.  You are ill. What is a blood glucose log?  A blood glucose log is a record of your blood glucose readings. It helps you and your health care provider:  Look for patterns in your blood glucose over time.  Adjust your diabetes management plan as needed.  Every time you check your blood glucose, write down your result and notes about things that may be affecting your blood glucose, such as your diet and exercise for the day.  Most glucose meters store a record of glucose readings in the meter. Some meters allow you to download your records to a computer. How do I check my blood glucose? Follow these steps to get accurate readings of your blood glucose: Supplies needed   Blood glucose meter.  Test strips for your meter. Each meter has its own strips. You must use the strips that come with your meter.  A needle to prick your finger (lancet). Do not use lancets more than once.  A device that holds the lancet (lancing device).  A journal or log book to write down your results. Procedure  Wash your hands with soap and water.  Prick the side of your finger (not the tip) with the lancet. Use a different finger each time.  Gently rub the finger until a small drop of blood appears.  Follow instructions that come with your meter for inserting the test  strip, applying blood to the strip, and using your blood glucose meter.  Write down your result and any notes. Alternative testing sites  Some meters allow you to use areas of your body other than your finger (alternative sites) to test your blood.  If you think you may have hypoglycemia, or if you have hypoglycemia unawareness, do not use alternative sites. Use your finger instead.  Alternative sites may not be as accurate as the fingers, because blood flow is slower in these areas. This means that the result you get may be delayed, and it may be different from the result that you would get from your  finger.  The most common alternative sites are:  Forearm.  Thigh.  Palm of the hand. Additional tips  Always keep your supplies with you.  If you have questions or need help, all blood glucose meters have a 24-hour "hotline" number that you can call. You may also contact your health care provider.  After you use a few boxes of test strips, adjust (calibrate) your blood glucose meter by following instructions that came with your meter. This information is not intended to replace advice given to you by your health care provider. Make sure you discuss any questions you have with your health care provider. Document Released: 03/27/2003 Document Revised: 10/12/2015 Document Reviewed: 09/03/2015 Elsevier Interactive Patient Education  2017 [employer].  Diabetes Mellitus and Sick Day Management Blood sugar (glucose) can be difficult to control when you are sick. Common illnesses that can cause problems for people with diabetes (diabetes mellitus) include colds, fever, flu (influenza), nausea, vomiting, and diarrhea. These illnesses can cause stress and loss of body fluids (dehydration), and those issues can cause blood glucose levels to increase. Because of this, it is very important to take your insulin and diabetes medicines and eat some form of carbohydrate when you are sick. You should make a plan for days when you are sick (sick day plan) as part of your diabetes management plan. You and your health care provider should make this plan in advance. The following guidelines are intended to help you manage an illness that lasts for about 24 hours or less. Your health care provider may also give you more specific instructions. What do I need to do to manage my blood glucose?  Check your blood glucose every 2-4 hours, or as often as told by your health care provider.  Know your sick day treatment goals. Your target blood glucose levels may be different when you are sick.  If you use insulin,  take your usual dose.  If your blood glucose continues to be too high, you may need to take an additional insulin dose as told by your health care provider.  If you use oral diabetes medicine, you may need to stop taking it if you are not able to eat or drink normally. Ask your health care provider about whether you need to stop taking these medicines while you are sick.  If you use injectable hormone medicines other than insulin to control your diabetes, ask your health care provider about whether you need to stop taking these medicines while you are sick. What else can I do to manage my diabetes when I am sick? Check your ketones  If you have type 1 diabetes, check your urine ketones every 4 hours.  If you have type 2 diabetes, check your urine ketones as often as told by your health care provider. Drink fluids  Drink enough fluid to keep  your urine clear or pale yellow. This is especially important if you have a fever, vomiting, or diarrhea. Those symptoms can lead to dehydration.  Follow any instructions from your health care provider about beverages to avoid.  Do not drink alcohol, caffeine, or drinks that contain a lot of sugar. Take medicines as directed  Take-over-the-counter and prescription medicines only as told by your health care provider.  Check medicine labels for added sugars. Some medicines may contain sugar or types of sugars that can raise your blood glucose level. What foods can I eat when I am sick? You need to eat some form of carbohydrates when you are sick. You should eat 45-50 grams (45-50 g) of carbohydrates every 3-4 hours until you feel better. All of the food choices below contain about 15 g of carbohydrates. Plan ahead and keep some of these foods around so you have them if you get sick.  4-6 oz (120-177 mL) carbonated beverage that contains sugar, such as regular (not diet) soda. You may be able to drink carbonated beverages more easily if you open the  beverage and let it sit at room temperature for a few minutes before drinking.   of a twin frozen ice pop.  4 oz (120 g) regular gelatin.  4 oz (120 mL) fruit juice.  4 oz (120 g) ice cream or frozen yogurt.  2 oz (60 g) sherbet.  8 oz (240 mL) clear broth or soup.  4 oz (120 g) regular custard.  4 oz (120 g) regular pudding.  8 oz (240 g) plain yogurt.  1 slice bread or toast.  6 saltine crackers.  5 vanilla wafers. Questions to ask your health care provider Consider asking the following questions so you know what to do on days when you are sick:  Should I adjust my diabetes medicines?  How often do I need to check my blood glucose?  What supplies do I need to manage my diabetes at home when I am sick?  What number can I call if I have questions?  What foods and drinks should I avoid? Contact a health care provider if:  You develop symptoms of diabetic ketoacidosis, such as:  Fatigue.  Weight loss.  Excessive thirst.  Light-headedness.  Fruity or sweet-smelling breath.  Excessive urination.  Vision changes.  Confusion or irritability.  Nausea.  Vomiting.  Rapid breathing.  Pain in the abdomen.  Feeling flushed.  You are unable to drink fluids without vomiting.  You have any of the following for more than 6 hours:  Nausea.  Vomiting.  Diarrhea.  Your blood glucose is at or above 240 mg/dL (13.3 mmol/L), even after you take an additional insulin dose.  You have a change in how you think, feel, or act (mental status).  You develop another serious illness.  You have been sick or have had a fever for 2 days or longer and you are not getting better. Get help right away if:  Your blood glucose is lower than 54 mg/dL (3.0 mmol/L).  You have difficulty breathing.  You have moderate or high ketone levels in your urine.  You used emergency glucagon to treat low blood glucose. Summary  Blood sugar (glucose) can be difficult to  control when you are sick. Common illnesses that can cause problems for people with diabetes (diabetes mellitus) include colds, fever, flu (influenza), nausea, vomiting, and diarrhea.  Illnesses can cause stress and loss of body fluids (dehydration), and those issues can cause blood glucose levels  to increase.  Make a plan for days when you are sick (sick day plan) as part of your diabetes management plan. You and your health care provider should make this plan in advance.  It is very important to take your insulin and diabetes medicines and to eat some form of carbohydrate when you are sick.  Contact your health care provider if have problems managing your blood glucose levels when you are sick, or if you have been sick or had a fever for 2 days or longer and are not getting better. This information is not intended to replace advice given to you by your health care provider. Make sure you discuss any questions you have with your health care provider. Document Released: 03/27/2003 Document Revised: 12/21/2015 Document Reviewed: 12/21/2015 Elsevier Interactive Patient Education  2017 [employer].

## 2016-08-19 ENCOUNTER — Other Ambulatory Visit: Payer: Self-pay | Admitting: Internal Medicine

## 2016-08-19 NOTE — Telephone Encounter (Signed)
refill 

## 2016-12-01 ENCOUNTER — Encounter: Payer: Self-pay | Admitting: Physician Assistant

## 2016-12-01 ENCOUNTER — Ambulatory Visit (INDEPENDENT_AMBULATORY_CARE_PROVIDER_SITE_OTHER): Payer: Managed Care, Other (non HMO) | Admitting: Physician Assistant

## 2016-12-01 ENCOUNTER — Other Ambulatory Visit: Payer: Self-pay | Admitting: Internal Medicine

## 2016-12-01 ENCOUNTER — Other Ambulatory Visit: Payer: Self-pay | Admitting: Urgent Care

## 2016-12-01 ENCOUNTER — Other Ambulatory Visit: Payer: Self-pay

## 2016-12-01 VITALS — BP 109/72 | HR 95 | Temp 99.1°F | Resp 18 | Ht 66.46 in | Wt 276.8 lb

## 2016-12-01 DIAGNOSIS — Z1231 Encounter for screening mammogram for malignant neoplasm of breast: Secondary | ICD-10-CM | POA: Diagnosis not present

## 2016-12-01 DIAGNOSIS — Z1159 Encounter for screening for other viral diseases: Secondary | ICD-10-CM | POA: Diagnosis not present

## 2016-12-01 DIAGNOSIS — Z114 Encounter for screening for human immunodeficiency virus [HIV]: Secondary | ICD-10-CM

## 2016-12-01 DIAGNOSIS — Z1211 Encounter for screening for malignant neoplasm of colon: Secondary | ICD-10-CM | POA: Diagnosis not present

## 2016-12-01 DIAGNOSIS — Z720 Tobacco use: Secondary | ICD-10-CM

## 2016-12-01 DIAGNOSIS — J309 Allergic rhinitis, unspecified: Secondary | ICD-10-CM | POA: Diagnosis not present

## 2016-12-01 DIAGNOSIS — Z23 Encounter for immunization: Secondary | ICD-10-CM | POA: Diagnosis not present

## 2016-12-01 DIAGNOSIS — E119 Type 2 diabetes mellitus without complications: Secondary | ICD-10-CM

## 2016-12-01 DIAGNOSIS — Z122 Encounter for screening for malignant neoplasm of respiratory organs: Secondary | ICD-10-CM

## 2016-12-01 DIAGNOSIS — Z1239 Encounter for other screening for malignant neoplasm of breast: Secondary | ICD-10-CM

## 2016-12-01 LAB — POCT GLYCOSYLATED HEMOGLOBIN (HGB A1C): HEMOGLOBIN A1C: 9

## 2016-12-01 MED ORDER — SITAGLIPTIN PHOSPHATE 100 MG PO TABS: 100.0000 mg | ORAL_TABLET | Freq: Every day | ORAL | 0 refills | Status: DC

## 2016-12-01 MED ORDER — FLUTICASONE PROPIONATE 50 MCG/ACT NA SUSP: 2.0000 | Freq: Every day | NASAL | 0 refills | Status: DC

## 2016-12-01 MED ORDER — METFORMIN HCL 1000 MG PO TABS: 1000.0000 mg | ORAL_TABLET | Freq: Two times a day (BID) | ORAL | 0 refills | Status: DC

## 2016-12-01 MED ORDER — ROSUVASTATIN CALCIUM 40 MG PO TABS: 40.0000 mg | ORAL_TABLET | Freq: Every day | ORAL | 0 refills | Status: DC

## 2016-12-01 NOTE — Progress Notes (Signed)
MRN: [id number]  Subjective:   Molly Payne is a 56 y.o. female who presents for follow up of Type 2 diabetes mellitus.   Diagnosis was made about 3 years ago. Patient is currently managed with Continued metformin 1000 mg BID which has been effective. Admits good compliance. Denies adverse effects including metallic taste, hypoglycemia, nausea, vomiting.   Patient is not currently checking home blood sugars. Current symptoms include none. Patient denies foot ulcerations, increased appetite, nausea, paresthesia of the feet, polydipsia, polyuria, visual disturbances, vomiting and weight loss. Patient is not checking their feet daily. No foot concerns. Last diabetic eye exam eye exam: 2017, it was normal.   Diet includes a little bit of everything. Will eat a variety of foot. Likes red meat, eats some veggies, loves fruits. Eats out on weekends. Drinks mostly water. Will occassionally drink sweet tea. Not currently exercising. Smokes 1 ppd x 33 years.   Known diabetic complications: none  Immunizations: Flu vaccine: 01/2016, pneumococal vaccine: never  Takes losartan 25 mg for kidney protection. Has hx of hyperlipidemia, controlled on Crestor 67m, pt is not fasting today.   Other concerns:  Has been having sneezing and itchy watery eyes over the past few days.Denies sinus pain, sore throat, ear pain, cough, fever, chills, and headache. Has no prior hx of allergies or asthma. Has not tried anything for relief.    Objective:   PHYSICAL EXAM BP 109/72 (BP Location: Left Arm, Patient Position: Sitting, Cuff Size: Large)   Pulse 95   Temp 99.1 F (37.3 C) (Oral)   Resp 18   Ht 5' 6.46" (1.688 m)   Wt 276 lb 12.8 oz (125.6 kg)   SpO2 96%   BMI 44.06 kg/m   Physical Exam  Constitutional: She is oriented to person, place, and time. She appears well-developed and well-nourished.  HENT:  Head: Normocephalic and atraumatic.  Right Ear: Tympanic membrane, external ear and ear canal  normal.  Left Ear: Tympanic membrane, external ear and ear canal normal.  Nose: Mucosal edema (mild ) and rhinorrhea present. Right sinus exhibits no maxillary sinus tenderness and no frontal sinus tenderness. Left sinus exhibits no maxillary sinus tenderness and no frontal sinus tenderness.  Mouth/Throat: Uvula is midline, oropharynx is clear and moist and mucous membranes are normal. No tonsillar exudate.  Eyes: Conjunctivae are normal.  Neck: Normal range of motion.  Cardiovascular: Normal rate, regular rhythm, normal heart sounds and intact distal pulses.   Pulmonary/Chest: Effort normal and breath sounds normal.  Abdominal: Soft. Bowel sounds are normal. There is no tenderness.  Lymphadenopathy:       Head (right side): No submental, no submandibular, no tonsillar, no preauricular, no posterior auricular and no occipital adenopathy present.       Head (left side): No submental, no submandibular, no tonsillar, no preauricular, no posterior auricular and no occipital adenopathy present.    She has no cervical adenopathy.       Right: No supraclavicular adenopathy present.       Left: No supraclavicular adenopathy present.  Neurological: She is alert and oriented to person, place, and time.  Skin: Skin is warm and dry.  Varicose veins and telangectasia noted on lower extremities bilaterally.   Psychiatric: She has a normal mood and affect.  Vitals reviewed.   Diabetic Foot Exam - Simple   Simple Foot Form Visual Inspection No deformities, no ulcerations, no other skin breakdown bilaterally:  Yes Sensation Testing Intact to touch and monofilament testing bilaterally:  Yes  Pulse Check Posterior Tibialis and Dorsalis pulse intact bilaterally:  Yes Comments     Results for orders placed or performed in visit on 12/01/16 (from the past 24 hour(s))  POCT glycosylated hemoglobin (Hb A1C)     Status: None   Collection Time: 12/01/16  6:15 PM  Result Value Ref Range   Hemoglobin A1C  9.0     Assessment and Plan :  1. Type 2 diabetes mellitus without complication, without long-term current use of insulin (HCC) Uncontrolled at this time. A1C has increased from 6.4 nine months ago to 9.0. Pt educated on lifestyle modifications that can aid in lowering blood glucose. Referral to diabetic education placed. Will add januvia at this time. Pt instructed to return in 3 months for diabetes follow up.  - HM Diabetes Foot Exam - Ambulatory referral to Ophthalmology - CBC with Differential/Platelet - CMP14+EGFR - Microalbumin/Creatinine Ratio, Urine - POCT glycosylated hemoglobin (Hb A1C) - Pneumococcal polysaccharide vaccine 23-valent greater than or equal to 2yo subcutaneous/IM - Ambulatory referral to diabetic education - sitaGLIPtin (JANUVIA) 100 MG tablet; Take 1 tablet (100 mg total) by mouth daily.  Dispense: 90 tablet; Refill: 0 - metFORMIN (GLUCOPHAGE) 1000 MG tablet; Take 1 tablet (1,000 mg total) by mouth 2 (two) times daily.  Dispense: 180 tablet; Refill: 0  2. Tobacco use - CT CHEST LUNG CA SCREEN LOW DOSE W/O CM; Future  3. Screening for HIV (human immunodeficiency virus) - HIV antibody  4. Need for hepatitis C screening test - Hepatitis C antibody  5. Encounter for screening for lung cancer - CT CHEST LUNG CA SCREEN LOW DOSE W/O CM; Futu  6. Screen for colon cancer - Ambulatory referral to Gastroenterology  7. Screening breast examination Given contact info to schedule mammogram.   8. Allergic rhinitis, unspecified seasonality, unspecified trigger PE findings consistent with allergic rhinitis. Return if sx worsen or persist.  - fluticasone (FLONASE) 50 MCG/ACT nasal spray; Place 2 sprays into both nostrils daily.  Dispense: 16 g; Refill: 0  [name], PA-C  [facility] 12/01/2016 10:12 PM

## 2016-12-01 NOTE — Telephone Encounter (Signed)
Rx(s) sent to pharmacy electronically.  

## 2016-12-01 NOTE — Patient Instructions (Addendum)
For diabetes, continue taking metformin 1000mg  twice daily. I would like you to also start [place] 100mg  daily. If this is too expensive when you get to your pharmacy please call our clinic and let us know. I would like you to check your fasting blood sugar at least 3 times a week. Your goal is 70-100. Before our next visit, please try to decrease the amount of carbs and sweets in your diet. Start engaging in exercise at least a few days a week, even walking for at least 10 minutes at a time would be great!   The referrals I placed today should all contact you within the next 1-2 weeks. Before your next appointment, my expectation is that you have gone to get a diabetic eye exam, seen diabetic nutritionist, gotten colonoscopy, mammogram, and gotten the CT of your lungs.   For allergies, start daily claratin and flonase.   Follow up with me in 3 months. Your A1C goal for that visit is a 7.0, which means your glucose average will be about 150. Please be fasting at this visit so we can check your lipids at that visit.  Thank you for letting me participate in your health and well being.    IF you received an x-ray today, you will receive an invoice from [facility]. Please contact [facility] at [phone] with questions or concerns regarding your invoice.   IF you received labwork today, you will receive an invoice from [facility]. Please contact [facility] at [phone] with questions or concerns regarding your invoice.   Our billing staff will not be able to assist you with questions regarding bills from these companies.  You will be contacted with the lab results as soon as they are available. The fastest way to get your results is to activate your My Chart account. Instructions are located on the last page of this paperwork. If you have not heard from [place] regarding the results in 2 weeks, please contact this office.    We recommend that you schedule a mammogram for breast  cancer screening. Typically, you do not need a referral to do this. Please contact a local imaging center to schedule your mammogram.  [facility] - [phone]  *ask for the Radiology Department [facility] ([place]) - [phone] or [phone]  [facility] - [phone] [place] [phone] [facility] - [phone]  *ask for the [facility] - [phone]  *ask for the Radiology Department [facility] - [phone]  *ask for the [place] - [phone]

## 2016-12-02 LAB — CBC WITH DIFFERENTIAL/PLATELET
BASOS ABS: 0 10*3/uL (ref 0.0–0.2)
BASOS: 0 %
EOS (ABSOLUTE): 0.1 10*3/uL (ref 0.0–0.4)
EOS: 1 %
HEMOGLOBIN: 15.3 g/dL (ref 11.1–15.9)
Hematocrit: 43.7 % (ref 34.0–46.6)
IMMATURE GRANS (ABS): 0 10*3/uL (ref 0.0–0.1)
Immature Granulocytes: 0 %
LYMPHS ABS: 2.4 10*3/uL (ref 0.7–3.1)
LYMPHS: 22 %
MCH: 30.8 pg (ref 26.6–33.0)
MCHC: 35 g/dL (ref 31.5–35.7)
MCV: 88 fL (ref 79–97)
Monocytes Absolute: 1 10*3/uL — ABNORMAL HIGH (ref 0.1–0.9)
Monocytes: 9 %
NEUTROS ABS: 7.4 10*3/uL — AB (ref 1.4–7.0)
NEUTROS PCT: 68 %
Platelets: 134 10*3/uL — ABNORMAL LOW (ref 150–379)
RBC: 4.97 x10E6/uL (ref 3.77–5.28)
RDW: 13.7 % (ref 12.3–15.4)
WBC: 11.1 10*3/uL — ABNORMAL HIGH (ref 3.4–10.8)

## 2016-12-02 LAB — MICROALBUMIN / CREATININE URINE RATIO
CREATININE, UR: 59.1 mg/dL
Microalb/Creat Ratio: <5.1 mg/g creat (ref 0.0–30.0)
Microalbumin, Urine: <3 ug/mL

## 2016-12-02 LAB — CMP14+EGFR
A/G RATIO: 1.6 (ref 1.2–2.2)
ALBUMIN: 4.3 g/dL (ref 3.5–5.5)
ALK PHOS: 128 IU/L — AB (ref 39–117)
ALT: 30 IU/L (ref 0–32)
AST: 41 IU/L — ABNORMAL HIGH (ref 0–40)
BILIRUBIN TOTAL: 0.4 mg/dL (ref 0.0–1.2)
BUN / CREAT RATIO: 16 (ref 9–23)
BUN: 13 mg/dL (ref 6–24)
CHLORIDE: 101 mmol/L (ref 96–106)
CO2: 23 mmol/L (ref 20–29)
Calcium: 9 mg/dL (ref 8.7–10.2)
Creatinine, Ser: 0.79 mg/dL (ref 0.57–1.00)
GFR calc non Af Amer: 85 mL/min/{1.73_m2} (ref >59)
GFR, EST AFRICAN AMERICAN: 97 mL/min/{1.73_m2} (ref >59)
GLUCOSE: 214 mg/dL — AB (ref 65–99)
Globulin, Total: 2.7 g/dL (ref 1.5–4.5)
POTASSIUM: 4.4 mmol/L (ref 3.5–5.2)
SODIUM: 139 mmol/L (ref 134–144)
TOTAL PROTEIN: 7 g/dL (ref 6.0–8.5)

## 2016-12-02 LAB — HEPATITIS C ANTIBODY

## 2016-12-02 LAB — HIV ANTIBODY (ROUTINE TESTING W REFLEX): HIV SCREEN 4TH GENERATION: NONREACTIVE

## 2016-12-03 ENCOUNTER — Ambulatory Visit (INDEPENDENT_AMBULATORY_CARE_PROVIDER_SITE_OTHER): Payer: Managed Care, Other (non HMO) | Admitting: Physician Assistant

## 2016-12-03 ENCOUNTER — Encounter: Payer: Self-pay | Admitting: Physician Assistant

## 2016-12-03 VITALS — BP 128/83 | HR 83 | Temp 98.3°F | Resp 16 | Ht 66.46 in | Wt 273.0 lb

## 2016-12-03 DIAGNOSIS — J029 Acute pharyngitis, unspecified: Secondary | ICD-10-CM | POA: Diagnosis not present

## 2016-12-03 DIAGNOSIS — J309 Allergic rhinitis, unspecified: Secondary | ICD-10-CM

## 2016-12-03 LAB — POCT RAPID STREP A (OFFICE): RAPID STREP A SCREEN: NEGATIVE

## 2016-12-03 MED ORDER — CETIRIZINE HCL 10 MG PO TABS: 10.0000 mg | ORAL_TABLET | Freq: Every day | ORAL | 11 refills | Status: DC

## 2016-12-03 MED ORDER — AMOXICILLIN 500 MG PO CAPS: 500.0000 mg | ORAL_CAPSULE | Freq: Three times a day (TID) | ORAL | 0 refills | Status: DC

## 2016-12-03 NOTE — Progress Notes (Signed)
MRN: [id number] DOB: 1961/01/23  Subjective:   Molly Payne is a 55 y.o. female presenting for chief complaint of Sore Throat (x 2 days) .  Reports one week history of sinus congestion, rhinorrhea, ear fullness and and scratchy throat.  Having associatd sneezing, itchy watery eyes, and postnasal drip with intermittent dry cough. Was seen in clinic 2 days ago for T2DM reevaluation and these URI sx were addressed. She was given Rx for flonase and instructed to use that and oral antihistamine. She has tried both. However, 2 days ago her throat started hurting worse and she developed a fever. Today, she denies sinus congestion, sinus pain, ear pain, wheezing, shortness of breath and chest pain, night sweats, decreased appetite, nausea, vomiting, abdominal pain and diarrhea. No history of seasonal allergies,no history of asthma of COPD. Smokes 1ppd daily. Denies alcohol use. Denies any other aggravating or relieving factors, no other questions or concerns.  Molly Payne has a current medication list which includes the following prescription(s): aspirin, blood glucose meter kit and supplies, fluticasone, ipratropium-albuterol, losartan, metformin, rosuvastatin, sitagliptin, amoxicillin, and cetirizine. Also has No Known Allergies.  Molly Payne  has a past medical history of Diabetes mellitus without complication ([place]). Also  has a past surgical history that includes Appendectomy; Cholecystectomy; and Abdominal hysterectomy.   Objective:   Vitals: BP 128/83 (BP Location: Right Arm, Patient Position: Sitting, Cuff Size: Large)   Pulse 83   Temp 98.3 F (36.8 C) (Oral)   Resp 16   Ht 5' 6.46" (1.688 m)   Wt 273 lb (123.8 kg)   SpO2 94%   BMI 43.46 kg/m   Physical Exam  Constitutional: She is oriented to person, place, and time. She appears well-developed and well-nourished. No distress.  HENT:  Head: Normocephalic and atraumatic.  Right Ear: External ear and ear canal normal. Tympanic membrane is not  erythematous and not bulging. A middle ear effusion is present.  Left Ear: External ear and ear canal normal. Tympanic membrane is not erythematous and not bulging. A middle ear effusion is present.  Nose: Mucosal edema (mild bilaterally) and rhinorrhea (mild bilaterally) present. Right sinus exhibits no maxillary sinus tenderness and no frontal sinus tenderness. Left sinus exhibits no maxillary sinus tenderness and no frontal sinus tenderness.  Mouth/Throat: Mucous membranes are normal. No trismus in the jaw. Abnormal dentition. No uvula swelling. Posterior oropharyngeal erythema (moderate) present. Tonsils are 2+ on the right. Tonsils are 2+ on the left. No tonsillar exudate.    Eyes: Conjunctivae are normal.  Neck: Normal range of motion.  Cardiovascular: Normal rate, regular rhythm and normal heart sounds.   Pulmonary/Chest: Effort normal and breath sounds normal. She has no wheezes. She has no rales.  Lymphadenopathy:       Head (right side): No submental, no submandibular, no tonsillar, no preauricular, no posterior auricular and no occipital adenopathy present.       Head (left side): No submental, no submandibular, no tonsillar, no preauricular, no posterior auricular and no occipital adenopathy present.    She has cervical adenopathy.       Right cervical: Posterior cervical adenopathy present. No superficial cervical and no deep cervical adenopathy present.      Left cervical: Posterior cervical adenopathy present. No superficial cervical and no deep cervical adenopathy present.       Right: No supraclavicular adenopathy present.       Left: No supraclavicular adenopathy present.  Neurological: She is alert and oriented to person, place, and time.  Skin:  Skin is warm and dry.  Psychiatric: She has a normal mood and affect.  Vitals reviewed.   Results for orders placed or performed in visit on 12/03/16 (from the past 24 hour(s))  POCT rapid strep A     Status: None   Collection  Time: 12/03/16  8:42 AM  Result Value Ref Range   Rapid Strep A Screen Negative Negative    Assessment and Plan :  1. Sore throat Due to hx and PE findings, will treat empirically for underlying bacterial etiology at this time. Pt encouraged to continue to use flonase and zyrtec daily as she is expressing underlying allergy symptoms as well. Culture pending. Return to clinic if symptoms worsen, do not improve in 10 days, or as needed - POCT rapid strep A - Culture, Group A Strep - amoxicillin (AMOXIL) 500 MG capsule; Take 1 capsule (500 mg total) by mouth 3 (three) times daily.  Dispense: 10 capsule; Refill: 0  2. Allergic rhinitis, unspecified seasonality, unspecified trigger - cetirizine (ZYRTEC) 10 MG tablet; Take 1 tablet (10 mg total) by mouth daily.  Dispense: 30 tablet; Refill: [place], PA-C  [facility] 12/03/2016 10:37 AM

## 2016-12-03 NOTE — Patient Instructions (Addendum)
I recommend using daily zyrtec and flonase. I have given you a Rx for amoxicillin to cover for any underlying bacterial etiology. We should have your culture results back in 3-5 days and will contact you with your results. If any of your symptoms worsen or no improvement in 10 days, please return to clinic. Thank you for letting me participate in your health and well being.    Pharyngitis Pharyngitis is a sore throat (pharynx). There is redness, pain, and swelling of your throat. Follow these instructions at home:  Drink enough fluids to keep your pee (urine) clear or pale yellow.  Only take medicine as told by your doctor. ? You may get sick again if you do not take medicine as told. Finish your medicines, even if you start to feel better. ? Do not take aspirin.  Rest.  Rinse your mouth (gargle) with salt water ( tsp of salt per 1 qt of water) every 1-2 hours. This will help the pain.  If you are not at risk for choking, you can suck on hard candy or sore throat lozenges. Contact a doctor if:  You have large, tender lumps on your neck.  You have a rash.  You cough up green, yellow-brown, or bloody spit. Get help right away if:  You have a stiff neck.  You drool or cannot swallow liquids.  You throw up (vomit) or are not able to keep medicine or liquids down.  You have very bad pain that does not go away with medicine.  You have problems breathing (not from a stuffy nose). This information is not intended to replace advice given to you by your health care provider. Make sure you discuss any questions you have with your health care provider. Document Released: 09/10/2007 Document Revised: 08/30/2015 Document Reviewed: 11/29/2012 Elsevier Interactive Patient Education  2017 [employer].     IF you received an x-ray today, you will receive an invoice from [facility]. Please contact [facility] at [phone] with questions or concerns regarding your  invoice.   IF you received labwork today, you will receive an invoice from [facility]. Please contact [facility] at [phone] with questions or concerns regarding your invoice.   Our billing staff will not be able to assist you with questions regarding bills from these companies.  You will be contacted with the lab results as soon as they are available. The fastest way to get your results is to activate your My Chart account. Instructions are located on the last page of this paperwork. If you have not heard from [place] regarding the results in 2 weeks, please contact this office.

## 2016-12-03 NOTE — Progress Notes (Deleted)
Subjective:     Molly Payne is a 56 y.o. female who presents for evaluation of sore throat. Associated symptoms include {symptoms; uri:5001::"nasal blockage","post nasal drip","sinus and nasal congestion","sore throat"}. Onset of symptoms was {1-10:13787} {time; units:18646} ago, and have been {clinical course:17::"unchanged"} since that time. She {hydration history:15378}. She {has/ has not:18111} had a recent close exposure to someone with proven streptococcal pharyngitis.  {Common ambulatory SmartLinks:19316}  Review of Systems {ros; complete:30496}    Objective:    {exam; complete:17964}  Laboratory Strep test {done:10129::"done"}. Results:{neg/pos:13464::"negative"}.    Assessment:    Acute pharyngitis, likely  {diagnosis; pharyngitis:11434}.    Plan:    {pharyngitis plan:10131}

## 2016-12-05 IMAGING — US US ABDOMEN COMPLETE
1 series · 13 of 25 positions shown · non-contrast
Comparison: CT images from cardiac CT scan dated September 27, 2015

CLINICAL DATA: Contour irregularities of the liver worrisome for
cirrhosis demonstrated on cardiac CT of September 27, 2015

EXAM:
ABDOMEN ULTRASOUND COMPLETE

[Series 1: us abdomen complete · 0.22mm/px · 13 of 72 slices shown]
[im 1/72]
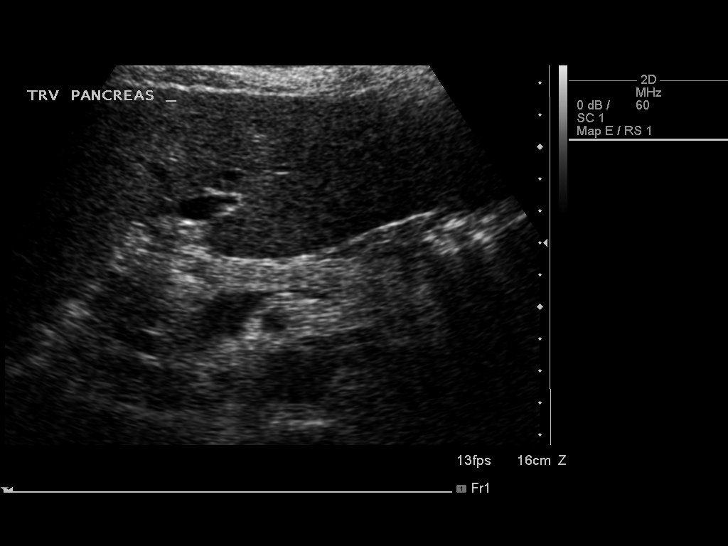
[im 6/72]
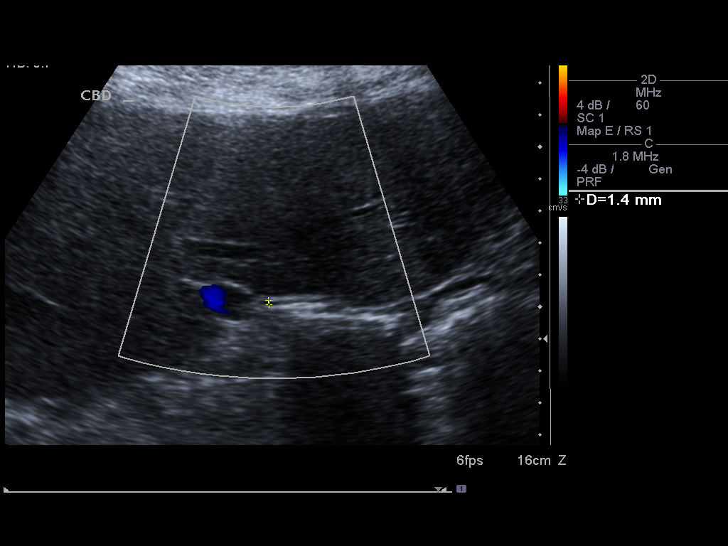
[im 12/72]
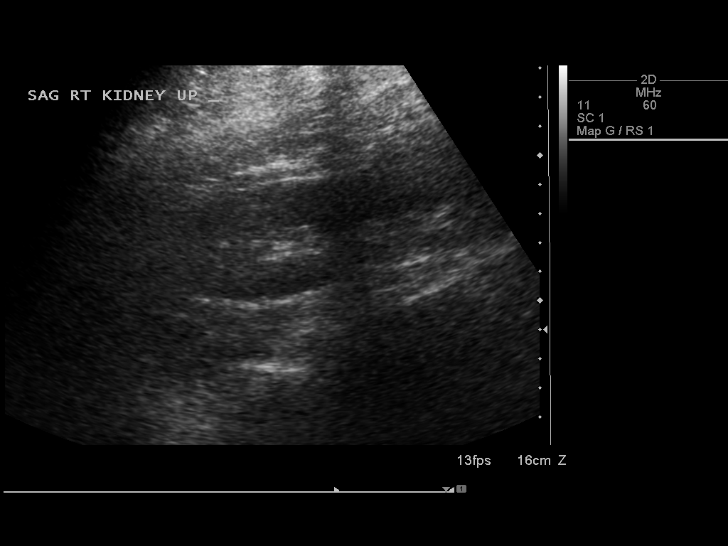
[im 18/72]
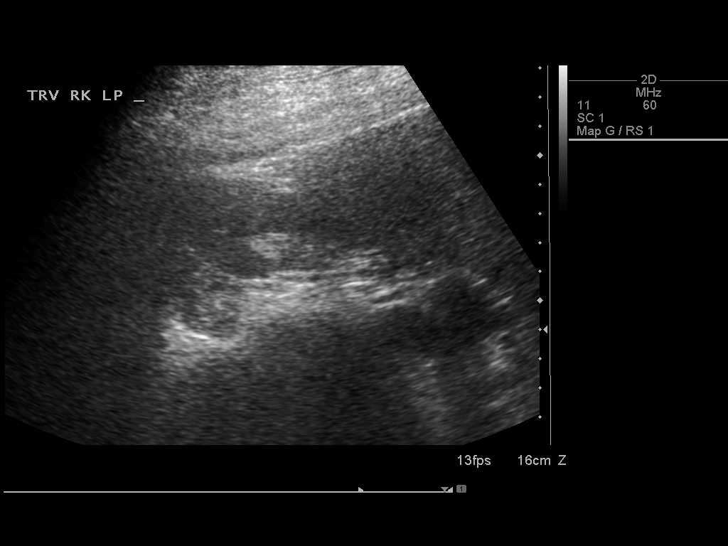
[im 24/72]
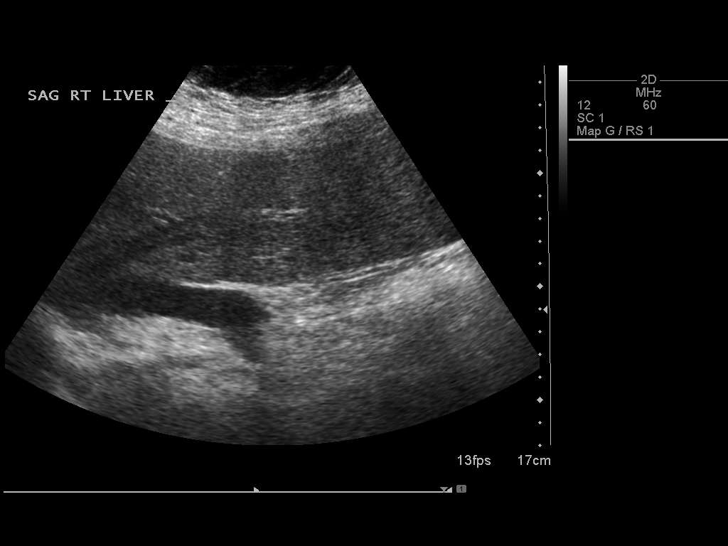
[im 30/72]
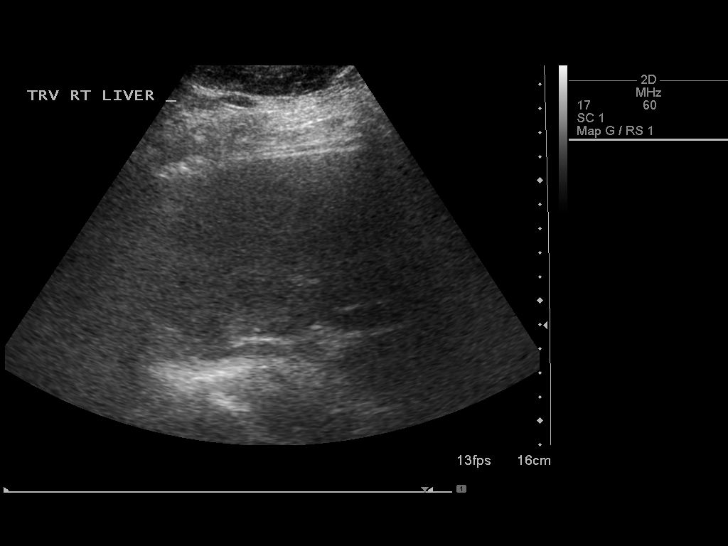
[im 36/72]
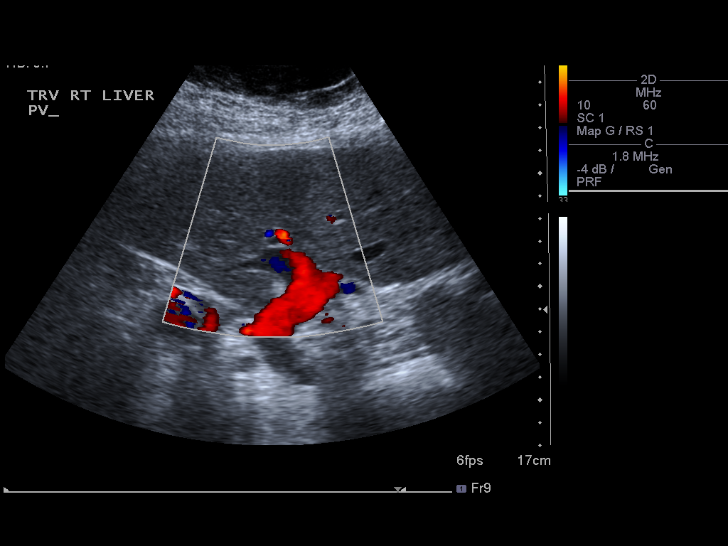
[im 42/72]
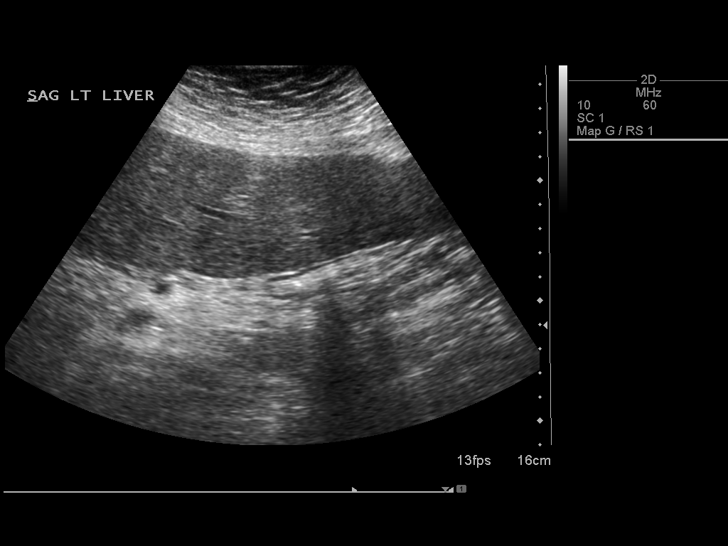
[im 48/72]
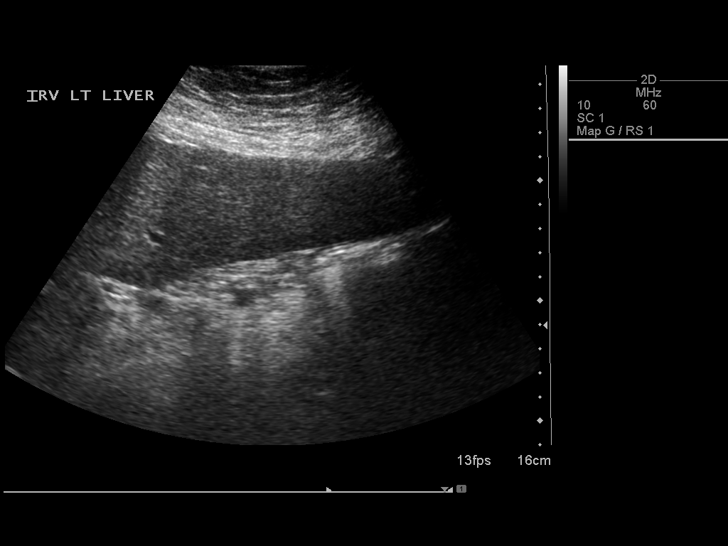
[im 54/72]
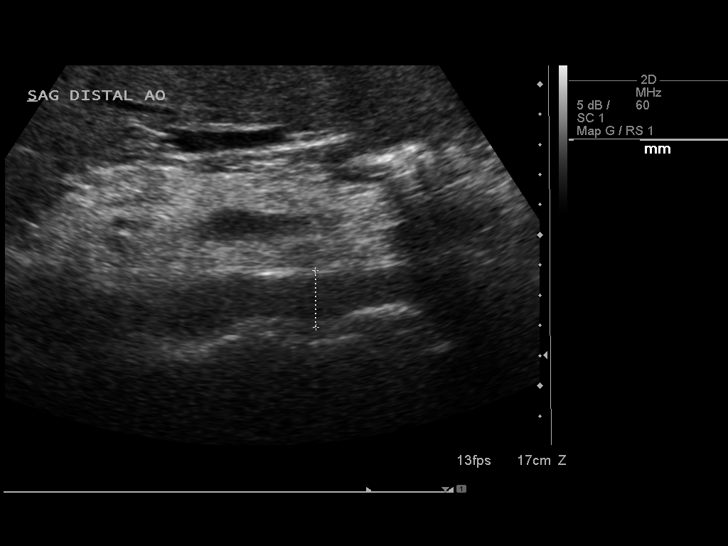
[im 60/72]
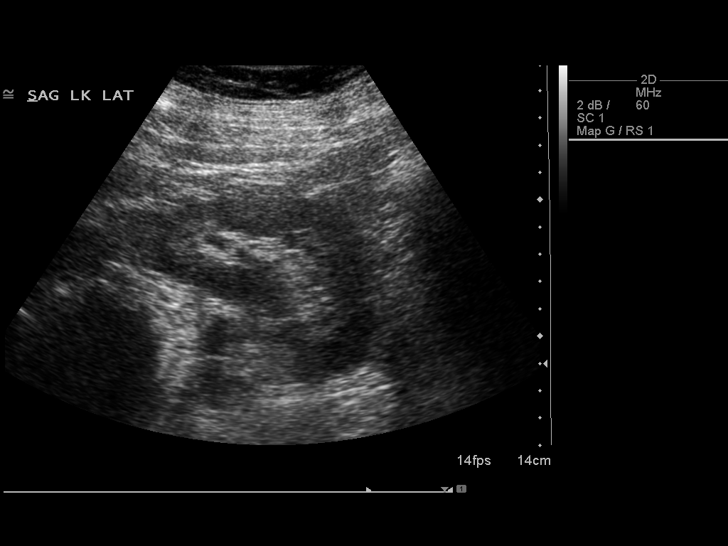
[im 66/72]
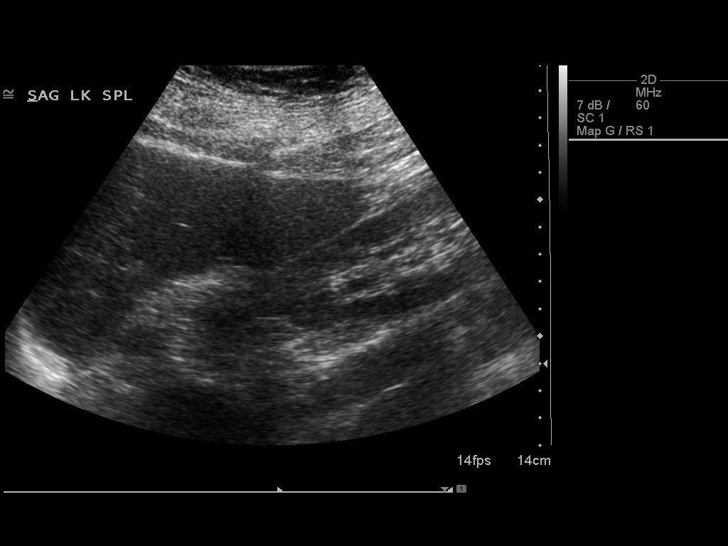
[im 72/72]
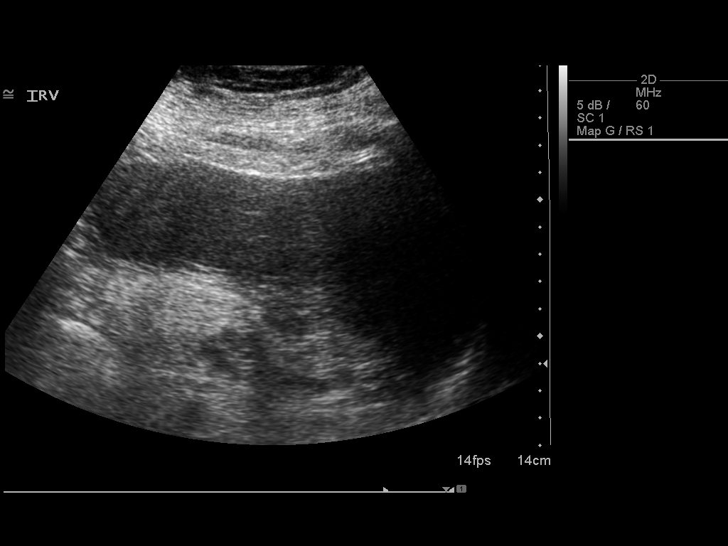

[13 of 25 positions shown; findings below may reference images not displayed]

FINDINGS: The patient's body habitus and bowel gas limit today's study.

Gallbladder: The gallbladder is surgically absent.

Common bile duct: Diameter: 1.4 mm

Liver: The liver is mildly prominent in size. The caudate lobe is
prominent. The echotexture is heterogeneous. The surface contour of
the liver is fairly smooth. There is no discrete mass nor
intrahepatic ductal dilation.

IVC: Visualized portion unremarkable.

Pancreas: Limited visualization of the pancreatic head and tail due
to bowel gas

Spleen: There is splenomegaly with calculated volume of 518 cc. The
splenic dimensions are 14.6 x 14.8 x 4.8 cm.

Right Kidney: Length: 12.9 cm. Visualization of the kidney is
limited due to the patient's body habitus. No discrete mass or
hydronephrosis is observed.

Left Kidney: Length: 11.6 cm. Echogenicity within normal limits. No
mass or hydronephrosis visualized.

Abdominal aorta: No abdominal aortic aneurysm is observed. The iliac
arteries are obscured by bowel gas.

Other findings: No ascites is demonstrated.
IMPRESSION: 1. Mild hepatomegaly and heterogeneous hepatic echotexture may
reflect medical renal disease. No abnormal masses or surface contour
nodularity is observed. Hepatic protocol MRI is recommended.
2. Mild splenomegaly.
3. The gallbladder is surgically absent. Limited visualization of
the pancreas and right kidney. No ascites.

## 2016-12-06 LAB — CULTURE, GROUP A STREP

## 2016-12-08 ENCOUNTER — Other Ambulatory Visit: Payer: Self-pay | Admitting: Physician Assistant

## 2016-12-08 MED ORDER — AMOXICILLIN 500 MG PO CAPS: 500.0000 mg | ORAL_CAPSULE | Freq: Two times a day (BID) | ORAL | 0 refills | Status: AC

## 2016-12-08 NOTE — Progress Notes (Signed)
Meds ordered this encounter  Medications  . amoxicillin (AMOXIL) 500 MG capsule    Sig: Take 1 capsule (500 mg total) by mouth 2 (two) times daily.    Dispense:  14 capsule    Refill:  0    Order Specific Question:   Supervising Provider    Answer:   Wardell Honour [[id number]]

## 2016-12-11 ENCOUNTER — Encounter: Payer: Self-pay | Admitting: Gastroenterology

## 2016-12-17 ENCOUNTER — Encounter: Payer: Self-pay | Admitting: Physician Assistant

## 2016-12-17 ENCOUNTER — Ambulatory Visit (INDEPENDENT_AMBULATORY_CARE_PROVIDER_SITE_OTHER): Payer: 59 | Admitting: Physician Assistant

## 2016-12-17 VITALS — BP 126/74 | HR 99 | Temp 98.4°F | Resp 17 | Ht 65.0 in | Wt 275.0 lb

## 2016-12-17 DIAGNOSIS — J029 Acute pharyngitis, unspecified: Secondary | ICD-10-CM | POA: Diagnosis not present

## 2016-12-17 LAB — POCT RAPID STREP A (OFFICE): Rapid Strep A Screen: NEGATIVE

## 2016-12-17 MED ORDER — PENICILLIN G BENZATHINE 1200000 UNIT/2ML IM SUSP
1.2000 10*6.[IU] | Freq: Once | INTRAMUSCULAR | Status: AC
  Administered 2016-12-17: 1.2 10*6.[IU] via INTRAMUSCULAR

## 2016-12-17 MED ORDER — LIDOCAINE VISCOUS 2 % MT SOLN: 5.0000 mL | OROMUCOSAL | 0 refills | Status: DC | PRN

## 2016-12-17 NOTE — Patient Instructions (Signed)
     IF you received an x-ray today, you will receive an invoice from [facility]. Please contact [facility] at [phone] with questions or concerns regarding your invoice.   IF you received labwork today, you will receive an invoice from [facility]. Please contact [facility] at [phone] with questions or concerns regarding your invoice.   Our billing staff will not be able to assist you with questions regarding bills from these companies.  You will be contacted with the lab results as soon as they are available. The fastest way to get your results is to activate your My Chart account. Instructions are located on the last page of this paperwork. If you have not heard from us regarding the results in 2 weeks, please contact this office.     

## 2016-12-17 NOTE — Progress Notes (Signed)
12/17/2016 5:04 PM   DOB: April 29, 1960 / MRN: [id number]  SUBJECTIVE:  Molly Payne is a 56 y.o. female presenting for sore throat.  Hx of uncontrolled diabetes. Was treated with amox by PA Molly Payne with amox 500 tid ten tabs.   Culture had later grown strep. Tells me that PA Molly Payne continued her course of amox and she took all of that and felt better but after stopping the amox her throat pain came back.  Her husband is also being treated for the illness.   She has No Known Allergies.   She  has a past medical history of Diabetes mellitus without complication ([place]).    She  reports that she has been smoking Cigarettes.  She has a 32.00 pack-year smoking history. She has never used smokeless tobacco. She reports that she does not drink alcohol or use drugs. She  reports that she does not engage in sexual activity. The patient  has a past surgical history that includes Appendectomy; Cholecystectomy; and Abdominal hysterectomy.  Her family history includes Heart attack in her sister; Heart disease in her mother, sister, and sister.  Review of Systems  Constitutional: Negative for chills, diaphoresis and fever.  Respiratory: Negative for shortness of breath.   Cardiovascular: Negative for chest pain, orthopnea and leg swelling.  Gastrointestinal: Negative for nausea.  Skin: Negative for rash.  Neurological: Negative for dizziness.    The problem list and medications were reviewed and updated by myself where necessary and exist elsewhere in the encounter.   OBJECTIVE:  BP 126/74   Pulse 99   Temp 98.4 F (36.9 C) (Oral)   Resp 17   Ht 5\' 5"  (1.651 m)   Wt 275 lb (124.7 kg)   SpO2 98%   BMI 45.76 kg/m   Pulse Readings from Last 3 Encounters:  12/17/16 99  12/03/16 83  12/01/16 95     Physical Exam  Constitutional: She is active.  Non-toxic appearance.  HENT:  Right Ear: Hearing, tympanic membrane, external ear and ear canal normal.  Left Ear: Hearing, tympanic membrane,  external ear and ear canal normal.  Nose: Nose normal. Right sinus exhibits no maxillary sinus tenderness and no frontal sinus tenderness. Left sinus exhibits no maxillary sinus tenderness and no frontal sinus tenderness.  Mouth/Throat: Uvula is midline and mucous membranes are normal. Mucous membranes are not dry. Posterior oropharyngeal erythema present. No oropharyngeal exudate, posterior oropharyngeal edema or tonsillar abscesses.  Cardiovascular: Normal rate.   Pulmonary/Chest: Effort normal. No tachypnea.  Lymphadenopathy:       Head (right side): No submandibular and no tonsillar adenopathy present.       Head (left side): Tonsillar adenopathy present. No submandibular adenopathy present.    She has no cervical adenopathy.    She has no axillary adenopathy.  Neurological: She is alert.  Skin: Skin is warm and dry. She is not diaphoretic. No pallor.    Results for orders placed or performed in visit on 12/17/16 (from the past 72 hour(s))  POCT rapid strep A     Status: None   Collection Time: 12/17/16  4:02 PM  Result Value Ref Range   Rapid Strep A Screen Negative Negative   Lab Results  Component Value Date   CREATININE 0.79 12/01/2016   BUN 13 12/01/2016   NA 139 12/01/2016   K 4.4 12/01/2016   CL 101 12/01/2016   CO2 23 12/01/2016     No results found.  ASSESSMENT AND PLAN:  Molly Payne  was seen today for sore throat.  Diagnoses and all orders for this visit:  Sore throat: Will treat here with bicillin. Culture is out.  If this fails then it must be another etiology.   -     POCT rapid strep A -     Culture, Group A Strep    The patient is advised to call or return to clinic if she does not see an improvement in symptoms, or to seek the care of the closest emergency department if she worsens with the above plan.   Molly Payne, MHS, PA-C [facility] 12/17/2016 5:04 PM

## 2016-12-18 ENCOUNTER — Ambulatory Visit: Payer: Managed Care, Other (non HMO) | Admitting: Physician Assistant

## 2016-12-20 LAB — CULTURE, GROUP A STREP: STREP A CULTURE: NEGATIVE

## 2016-12-29 ENCOUNTER — Ambulatory Visit
Admission: RE | Admit: 2016-12-29 | Discharge: 2016-12-29 | Disposition: A | Discharge status: Home / Self Care | Payer: Managed Care, Other (non HMO) | Source: Ambulatory Visit | Attending: Physician Assistant | Admitting: Physician Assistant

## 2016-12-29 DIAGNOSIS — Z720 Tobacco use: Secondary | ICD-10-CM

## 2016-12-29 DIAGNOSIS — Z122 Encounter for screening for malignant neoplasm of respiratory organs: Secondary | ICD-10-CM

## 2017-01-06 ENCOUNTER — Telehealth: Payer: Self-pay | Admitting: Physician Assistant

## 2017-01-06 NOTE — Telephone Encounter (Signed)
Called pt to discuss low dose CT results. No answer. Voice mailbox is full.

## 2017-01-17 ENCOUNTER — Telehealth: Payer: Self-pay | Admitting: Physician Assistant

## 2017-01-17 ENCOUNTER — Encounter: Payer: Self-pay | Admitting: Radiology

## 2017-01-17 DIAGNOSIS — R748 Abnormal levels of other serum enzymes: Secondary | ICD-10-CM

## 2017-01-17 DIAGNOSIS — R9389 Abnormal findings on diagnostic imaging of other specified body structures: Secondary | ICD-10-CM

## 2017-01-17 DIAGNOSIS — R160 Hepatomegaly, not elsewhere classified: Secondary | ICD-10-CM

## 2017-01-17 NOTE — Telephone Encounter (Signed)
Called pt to discuss low dose CT results. No answer. Voice mailbox is full.   Orders Placed This Encounter  Procedures  . CT Abdomen Pelvis Wo Contrast    Standing Status:   Future    Standing Expiration Date:   02/17/2017    Order Specific Question:   Is patient pregnant?    Answer:   Yes    Order Specific Question:   Preferred imaging location?    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  . Ambulatory referral to Gastroenterology    Referral Priority:   Routine    Referral Type:   Consultation    Referral Reason:   Specialty Services Required    Number of Visits Requested:   1

## 2017-01-20 ENCOUNTER — Ambulatory Visit: Payer: Managed Care, Other (non HMO) | Admitting: Dietician

## 2017-01-26 ENCOUNTER — Encounter: Payer: Self-pay | Admitting: Gastroenterology

## 2017-01-26 ENCOUNTER — Ambulatory Visit (AMBULATORY_SURGERY_CENTER): Payer: Self-pay | Admitting: *Deleted

## 2017-01-26 VITALS — Ht 67.0 in | Wt 272.0 lb

## 2017-01-26 DIAGNOSIS — Z1211 Encounter for screening for malignant neoplasm of colon: Secondary | ICD-10-CM

## 2017-01-26 MED ORDER — NA SULFATE-K SULFATE-MG SULF 17.5-3.13-1.6 GM/177ML PO SOLN: 1.0000 | Freq: Once | ORAL | 0 refills | Status: AC

## 2017-01-26 NOTE — Progress Notes (Signed)
No egg or soy allergy known to patient  No issues with past sedation with any surgeries  or procedures, no intubation problems  No diet pills per patient No home 02 use per patient  No blood thinners per patient  Pt denies issues with constipation  No A fib or A flutter  EMMI video sent to pt's e mail -pt declined  $15 coupon to pt for suprep in [place] today

## 2017-02-02 ENCOUNTER — Other Ambulatory Visit: Payer: Self-pay | Admitting: Internal Medicine

## 2017-02-04 ENCOUNTER — Telehealth: Payer: Self-pay | Admitting: Physician Assistant

## 2017-02-04 NOTE — Telephone Encounter (Signed)
Case #[id number]

## 2017-02-04 NOTE — Telephone Encounter (Signed)
[employer] contacted our office to let us know pt's CT Abdomen and Pelvis w/o contrast has been denied. They stated,  "Based on [employer] Abdomen Imaging Guidelines, we are unable to approve the requested procedure. Guidelines support an adrenal washout protocol CT scan (CT abdomen without and with contrast or CT abdomen with contrast if a CT abdomen without contrast has already been performed) for the evaluation of adrenal cortical lesions. A CT abdomen with adrenal washout protocol can be approved as an alternative. [employer] will contact your office regarding this recommendation. "    They did say that a CT Abdomen with (CPT 74160) or a CT Abdomen with and without contrast (CPT 74170) could be approved if desired and we can call them to let them know without starting a new prior auth. Please advise. [employer] can be reached at [phone]. Thanks!

## 2017-02-09 ENCOUNTER — Ambulatory Visit: Payer: Managed Care, Other (non HMO) | Admitting: Dietician

## 2017-02-09 ENCOUNTER — Ambulatory Visit (AMBULATORY_SURGERY_CENTER): Payer: 59 | Admitting: Gastroenterology

## 2017-02-09 ENCOUNTER — Encounter: Payer: Self-pay | Admitting: Gastroenterology

## 2017-02-09 VITALS — BP 121/96 | HR 71 | Temp 97.8°F | Resp 14 | Ht 67.0 in | Wt 272.0 lb

## 2017-02-09 DIAGNOSIS — Z1211 Encounter for screening for malignant neoplasm of colon: Secondary | ICD-10-CM

## 2017-02-09 DIAGNOSIS — D127 Benign neoplasm of rectosigmoid junction: Secondary | ICD-10-CM | POA: Diagnosis not present

## 2017-02-09 DIAGNOSIS — Z1212 Encounter for screening for malignant neoplasm of rectum: Secondary | ICD-10-CM | POA: Diagnosis not present

## 2017-02-09 DIAGNOSIS — D125 Benign neoplasm of sigmoid colon: Secondary | ICD-10-CM

## 2017-02-09 DIAGNOSIS — D123 Benign neoplasm of transverse colon: Secondary | ICD-10-CM

## 2017-02-09 MED ORDER — SODIUM CHLORIDE 0.9 % IV SOLN: 500.0000 mL | INTRAVENOUS | Status: DC

## 2017-02-09 NOTE — Patient Instructions (Signed)
Impression/Recomomendations:  Polyp handout given to patient.  Resume previous diet. Continue present medications.  Repeat colonoscopy recommended for surveillance.  Date to be determined after pathology results reviewed.  No Ibuprofen, Naproxen, or other NSAID drugs for 2 weeks.  Tylenol only until 11/19 2018.  YOU HAD AN ENDOSCOPIC PROCEDURE TODAY AT [facility]:   Refer to the procedure report that was given to you for any specific questions about what was found during the examination.  If the procedure report does not answer your questions, please call your gastroenterologist to clarify.  If you requested that your care partner not be given the details of your procedure findings, then the procedure report has been included in a sealed envelope for you to review at your convenience later.  YOU SHOULD EXPECT: Some feelings of bloating in the abdomen. Passage of more gas than usual.  Walking can help get rid of the air that was put into your GI tract during the procedure and reduce the bloating. If you had a lower endoscopy (such as a colonoscopy or flexible sigmoidoscopy) you may notice spotting of blood in your stool or on the toilet paper. If you underwent a bowel prep for your procedure, you may not have a normal bowel movement for a few days.  Please Note:  You might notice some irritation and congestion in your nose or some drainage.  This is from the oxygen used during your procedure.  There is no need for concern and it should clear up in a day or so.  SYMPTOMS TO REPORT IMMEDIATELY:   Following lower endoscopy (colonoscopy or flexible sigmoidoscopy):  Excessive amounts of blood in the stool  Significant tenderness or worsening of abdominal pains  Swelling of the abdomen that is new, acute  Fever of 100F or higher  For urgent or emergent issues, a gastroenterologist can be reached at any hour by calling [phone].   DIET:  We do recommend a small meal at  first, but then you may proceed to your regular diet.  Drink plenty of fluids but you should avoid alcoholic beverages for 24 hours.  ACTIVITY:  You should plan to take it easy for the rest of today and you should NOT DRIVE or use heavy machinery until tomorrow (because of the sedation medicines used during the test).    FOLLOW UP: Our staff will call the number listed on your records the next business day following your procedure to check on you and address any questions or concerns that you may have regarding the information given to you following your procedure. If we do not reach you, we will leave a message.  However, if you are feeling well and you are not experiencing any problems, there is no need to return our call.  We will assume that you have returned to your regular daily activities without incident.  If any biopsies were taken you will be contacted by phone or by letter within the next 1-3 weeks.  Please call us at [phone] if you have not heard about the biopsies in 3 weeks.    SIGNATURES/CONFIDENTIALITY: You and/or your care partner have signed paperwork which will be entered into your electronic medical record.  These signatures attest to the fact that that the information above on your After Visit Summary has been reviewed and is understood.  Full responsibility of the confidentiality of this discharge information lies with you and/or your care-partner.

## 2017-02-09 NOTE — Progress Notes (Signed)
Pt's states no medical or surgical changes since previsit or office visit. 

## 2017-02-09 NOTE — Op Note (Signed)
[place] Patient Name: Molly Payne Procedure Date: 02/09/2017 8:31 AM MRN: [id number] Endoscopist: Remo Lipps P. Baine Decesare MD, MD Age: 56 Referring MD:  Date of Birth: May 04, 1960 Gender: Female Account #: [id number] Procedure:                Colonoscopy Indications:              Screening for colorectal malignant neoplasm, This                            is the patient's first colonoscopy Medicines:                Monitored Anesthesia Care Procedure:                Pre-Anesthesia Assessment:                           - Prior to the procedure, a History and Physical                            was performed, and patient medications and                            allergies were reviewed. The patient's tolerance of                            previous anesthesia was also reviewed. The risks                            and benefits of the procedure and the sedation                            options and risks were discussed with the patient.                            All questions were answered, and informed consent                            was obtained. Prior Anticoagulants: The patient has                            taken no previous anticoagulant or antiplatelet                            agents. ASA Grade Assessment: II - A patient with                            mild systemic disease. After reviewing the risks                            and benefits, the patient was deemed in                            satisfactory condition to undergo the procedure.  After obtaining informed consent, the colonoscope                            was passed under direct vision. Throughout the                            procedure, the patient's blood pressure, pulse, and                            oxygen saturations were monitored continuously. The                            Colonoscope was introduced through the anus and                            advanced to the the  cecum, identified by                            appendiceal orifice and ileocecal valve. The                            colonoscopy was performed without difficulty. The                            patient tolerated the procedure well. The quality                            of the bowel preparation was good. The ileocecal                            valve, appendiceal orifice, and rectum were                            photographed. Scope In: 8:32:48 AM Scope Out: 8:50:46 AM Scope Withdrawal Time: 0 hours 12 minutes 24 seconds  Total Procedure Duration: 0 hours 17 minutes 58 seconds  Findings:                 The perianal and digital rectal examinations were                            normal.                           A 4 mm polyp was found in the transverse colon. The                            polyp was sessile. The polyp was removed with a                            cold snare. Resection and retrieval were complete.                           Two sessile polyps were found in the sigmoid colon.  The polyps were 3 to 4 mm in size. These polyps                            were removed with a cold snare. Resection and                            retrieval were complete.                           Two sessile polyps were found in the recto-sigmoid                            colon. The polyps were 3 to 4 mm in size. These                            polyps were removed with a cold snare. Resection                            and retrieval were complete.                           The exam was otherwise without abnormality on                            direct and retroflexion views. Complications:            No immediate complications. Estimated blood loss:                            Minimal. Estimated Blood Loss:     Estimated blood loss was minimal. Impression:               - One 4 mm polyp in the transverse colon, removed                            with a cold snare.  Resected and retrieved.                           - Two 3 to 4 mm polyps in the sigmoid colon,                            removed with a cold snare. Resected and retrieved.                           - Two 3 to 4 mm polyps at the recto-sigmoid colon,                            removed with a cold snare. Resected and retrieved.                           - The examination was otherwise normal on direct  and retroflexion views. Recommendation:           - Patient has a contact number available for                            emergencies. The signs and symptoms of potential                            delayed complications were discussed with the                            patient. Return to normal activities tomorrow.                            Written discharge instructions were provided to the                            patient.                           - Resume previous diet.                           - Continue present medications.                           - Await pathology results.                           - Repeat colonoscopy is recommended for                            surveillance. The colonoscopy date will be                            determined after pathology results from today's                            exam become available for review.                           - No ibuprofen, naproxen, or other non-steroidal                            anti-inflammatory drugs for 2 weeks after polyp                            removal. Remo Lipps P. Myleen Brailsford MD, MD 02/09/2017 8:55:13 AM This report has been signed electronically.

## 2017-02-09 NOTE — Progress Notes (Signed)
Called to room to assist during endoscopic procedure.  Patient ID and intended procedure confirmed with present staff. Received instructions for my participation in the procedure from the performing physician.  

## 2017-02-09 NOTE — Progress Notes (Signed)
Report given.

## 2017-02-10 ENCOUNTER — Telehealth: Payer: Self-pay

## 2017-02-10 NOTE — Telephone Encounter (Signed)
  Follow up Call-  Call back number 02/09/2017  Post procedure Call Back phone  # [phone]  Permission to leave phone message Yes  comments IF VOICEMAIL IS NOT FULL  Some recent data might be hidden     Patient questions:  Do you have a fever, pain , or abdominal swelling? No. Pain Score  0 *  Have you tolerated food without any problems? Yes.    Have you been able to return to your normal activities? Yes.    Do you have any questions about your discharge instructions: Diet   No. Medications  No. Follow up visit  No.  Do you have questions or concerns about your Care? No.  Actions: * If pain score is 4 or above: No action needed, pain <4.

## 2017-02-12 ENCOUNTER — Other Ambulatory Visit: Payer: Self-pay

## 2017-02-12 ENCOUNTER — Ambulatory Visit (INDEPENDENT_AMBULATORY_CARE_PROVIDER_SITE_OTHER): Payer: 59 | Admitting: Family Medicine

## 2017-02-12 ENCOUNTER — Encounter: Payer: Self-pay | Admitting: Family Medicine

## 2017-02-12 VITALS — BP 122/68 | HR 96 | Temp 98.6°F | Resp 18 | Ht 67.17 in | Wt 271.6 lb

## 2017-02-12 DIAGNOSIS — R350 Frequency of micturition: Secondary | ICD-10-CM | POA: Diagnosis not present

## 2017-02-12 DIAGNOSIS — N3001 Acute cystitis with hematuria: Secondary | ICD-10-CM

## 2017-02-12 LAB — POCT URINALYSIS DIP (MANUAL ENTRY)
Bilirubin, UA: NEGATIVE
Glucose, UA: NEGATIVE mg/dL
Ketones, POC UA: NEGATIVE mg/dL
Nitrite, UA: NEGATIVE
Protein Ur, POC: NEGATIVE mg/dL
Spec Grav, UA: 1.015 (ref 1.010–1.025)
Urobilinogen, UA: 0.2 E.U./dL
pH, UA: 6 (ref 5.0–8.0)

## 2017-02-12 MED ORDER — NITROFURANTOIN MONOHYD MACRO 100 MG PO CAPS: 100.0000 mg | ORAL_CAPSULE | Freq: Two times a day (BID) | ORAL | 0 refills | Status: DC

## 2017-02-12 MED ORDER — PHENAZOPYRIDINE HCL 100 MG PO TABS: 100.0000 mg | ORAL_TABLET | Freq: Three times a day (TID) | ORAL | 0 refills | Status: DC | PRN

## 2017-02-12 NOTE — Progress Notes (Signed)
11/8/20185:25 PM  Molly Payne [birth year], 56 y.o. female [id number]  Chief Complaint  Patient presents with  . Dysuria    X 3 days    HPI:   Patient is a 56 y.o. female who presents today for 3 days of urinary frequency and burning. Denies any blood, nausea, vomiting, flank pain, fever or chills. Denies h/o recurrent UTIs.  Depression screen [facility] 2/9 02/12/2017 12/17/2016 12/03/2016  Decreased Interest 0 0 0  Down, Depressed, Hopeless 0 0 0  PHQ - 2 Score 0 0 0    No Known Allergies  Prior to Admission medications   Medication Sig Start Date End Date Taking? Authorizing Provider  aspirin 81 MG tablet Take 81 mg by mouth daily.   Yes [provider]  blood glucose meter kit and supplies KIT Dispense based on patient and insurance preference. Use up to four times daily as directed. (FOR ICD-10 E11.69). 05/10/16  Yes Shawnee Knapp, MD  cetirizine (ZYRTEC) 10 MG tablet Take 1 tablet (10 mg total) by mouth daily. 12/03/16  Yes Timmothy Euler, [place] D, PA-C  fluticasone (FLONASE) 50 MCG/ACT nasal spray Place 2 sprays into both nostrils daily. 12/01/16  Yes Timmothy Euler, [place] D, PA-C  Ipratropium-Albuterol (COMBIVENT) 20-100 MCG/ACT AERS respimat Inhale 1 puff into the lungs 4 (four) times daily. 05/10/16  Yes Shawnee Knapp, MD  lidocaine (XYLOCAINE) 2 % solution Use as directed 5 mLs in the mouth or throat as needed for mouth pain. 12/17/16  Yes Tereasa Coop, PA-C  losartan (COZAAR) 25 MG tablet TAKE ONE TABLET BY MOUTH ONCE DAILY 08/19/16  Yes Hilty, Nadean Corwin, MD  metFORMIN (GLUCOPHAGE) 1000 MG tablet Take 1 tablet (1,000 mg total) by mouth 2 (two) times daily. 12/01/16  Yes Timmothy Euler, [place] D, PA-C  Omega-3 Fatty Acids (FISH OIL) 1000 MG CAPS Take 2 capsules by mouth daily.   Yes [provider]  rosuvastatin (CRESTOR) 40 MG tablet TAKE 1 TABLET BY MOUTH ONCE DAILY 02/02/17  Yes Hilty, Nadean Corwin, MD  sitaGLIPtin (JANUVIA) 100 MG tablet Take 1 tablet (100 mg total) by mouth  daily. 12/01/16  Yes Leonie Douglas, PA-C    Past Medical History:  Diagnosis Date  . Allergy   . Diabetes mellitus without complication ([place])   . Hyperlipidemia     Past Surgical History:  Procedure Laterality Date  . ABDOMINAL HYSTERECTOMY    . APPENDECTOMY    . CHOLECYSTECTOMY      Social History   Tobacco Use  . Smoking status: Current Every Day Smoker    Packs/day: 1.00    Years: 32.00    Pack years: 32.00    Types: Cigarettes  . Smokeless tobacco: Never Used  Substance Use Topics  . Alcohol use: No    Alcohol/week: 0.0 oz    Family History  Problem Relation Age of Onset  . Heart disease Mother   . Heart disease Sister   . Heart disease Sister   . Heart attack Sister   . Colon cancer Neg Hx   . Colon polyps Neg Hx   . Esophageal cancer Neg Hx   . Rectal cancer Neg Hx   . Stomach cancer Neg Hx     ROS Per hpi  OBJECTIVE:  Blood pressure 122/68, pulse 96, temperature 98.6 F (37 C), temperature source Oral, resp. rate 18, height 5' 7.17" (1.706 m), weight 271 lb 9.6 oz (123.2 kg), SpO2 98 %.  Physical Exam  Constitutional: She is oriented to person,  place, and time and well-developed, well-nourished, and in no distress.  HENT:  Head: Normocephalic and atraumatic.  Mouth/Throat: Oropharynx is clear and moist. No oropharyngeal exudate.  Eyes: EOM are normal. Pupils are equal, round, and reactive to light. No scleral icterus.  Neck: Neck supple.  Cardiovascular: Normal rate, regular rhythm and normal heart sounds. Exam reveals no gallop and no friction rub.  No murmur heard. Pulmonary/Chest: Effort normal and breath sounds normal. She has no wheezes. She has no rales.  Abdominal: There is no CVA tenderness.  Musculoskeletal: She exhibits no edema.  Neurological: She is alert and oriented to person, place, and time. Gait normal.  Skin: Skin is warm and dry.    Results for orders placed or performed in visit on 02/12/17 (from the past 24 hour(s))    POCT urinalysis dipstick     Status: Abnormal   Collection Time: 02/12/17  4:54 PM  Result Value Ref Range   Color, UA orange (A) yellow   Clarity, UA cloudy (A) clear   Glucose, UA negative negative mg/dL   Bilirubin, UA negative negative   Ketones, POC UA negative negative mg/dL   Spec Grav, UA 1.015 1.010 - 1.025   Blood, UA small (A) negative   pH, UA 6.0 5.0 - 8.0   Protein Ur, POC negative negative mg/dL   Urobilinogen, UA 0.2 0.2 or 1.0 E.U./dL   Nitrite, UA Negative Negative   Leukocytes, UA Small (1+) (A) Negative    No results found.   ASSESSMENT and PLAN 1. Urinary frequency - POCT urinalysis dipstick - Urine Culture  2. Acute cystitis with hematuria Discussed supportive measures, new meds r/se/b and RTC precautions. Patient educational handout given.  Other orders - nitrofurantoin, macrocrystal-monohydrate, (MACROBID) 100 MG capsule; Take 1 capsule (100 mg total) 2 (two) times daily by mouth. - phenazopyridine (PYRIDIUM) 100 MG tablet; Take 1 tablet (100 mg total) 3 (three) times daily as needed by mouth for pain.  Return if symptoms worsen or fail to improve.    Rutherford Guys, MD [facility] at [place], [place] [address] Ph.  [phone] Fax [phone]

## 2017-02-12 NOTE — Patient Instructions (Addendum)
     IF you received an x-ray today, you will receive an invoice from [facility]. Please contact [facility] at [phone] with questions or concerns regarding your invoice.   IF you received labwork today, you will receive an invoice from [facility]. Please contact [facility] at [phone] with questions or concerns regarding your invoice.   Our billing staff will not be able to assist you with questions regarding bills from these companies.  You will be contacted with the lab results as soon as they are available. The fastest way to get your results is to activate your My Chart account. Instructions are located on the last page of this paperwork. If you have not heard from us regarding the results in 2 weeks, please contact this office.     Urinary Tract Infection, Adult A urinary tract infection (UTI) is an infection of any part of the urinary tract. The urinary tract includes the:  Kidneys.  Ureters.  Bladder.  Urethra.  These organs make, store, and get rid of pee (urine) in the body. Follow these instructions at home:  Take over-the-counter and prescription medicines only as told by your doctor.  If you were prescribed an antibiotic medicine, take it as told by your doctor. Do not stop taking the antibiotic even if you start to feel better.  Avoid the following drinks: ? Alcohol. ? Caffeine. ? Tea. ? Carbonated drinks.  Drink enough fluid to keep your pee clear or pale yellow.  Keep all follow-up visits as told by your doctor. This is important.  Make sure to: ? Empty your bladder often and completely. Do not to hold pee for long periods of time. ? Empty your bladder before and after sex. ? Wipe from front to back after a bowel movement if you are female. Use each tissue one time when you wipe. Contact a doctor if:  You have back pain.  You have a fever.  You feel sick to your stomach (nauseous).  You throw up (vomit).  Your symptoms do  not get better after 3 days.  Your symptoms go away and then come back. Get help right away if:  You have very bad back pain.  You have very bad lower belly (abdominal) pain.  You are throwing up and cannot keep down any medicines or water. This information is not intended to replace advice given to you by your health care provider. Make sure you discuss any questions you have with your health care provider. Document Released: 09/10/2007 Document Revised: 08/30/2015 Document Reviewed: 02/12/2015 Elsevier Interactive Patient Education  2018 [employer]  

## 2017-02-13 ENCOUNTER — Encounter: Payer: Self-pay | Admitting: Gastroenterology

## 2017-02-14 LAB — URINE CULTURE

## 2017-02-16 NOTE — Progress Notes (Signed)
On appropriate abx, no further action needed

## 2017-02-17 NOTE — Telephone Encounter (Signed)
Could we let them know that a CT abdomen with and without contrast is fine to order. Do I need to place the order? Please let me know. Thank you!

## 2017-02-23 ENCOUNTER — Other Ambulatory Visit: Payer: Self-pay | Admitting: Physician Assistant

## 2017-02-23 DIAGNOSIS — R9389 Abnormal findings on diagnostic imaging of other specified body structures: Secondary | ICD-10-CM

## 2017-02-23 NOTE — Progress Notes (Signed)
Orders Placed This Encounter  Procedures  . CT ABDOMEN W WO CONTRAST    Standing Status:   Future    Standing Expiration Date:   05/26/2018    Order Specific Question:   If indicated for the ordered procedure, I authorize the administration of contrast media per Radiology protocol    Answer:   Yes    Order Specific Question:   Is patient pregnant?    Answer:   No    Order Specific Question:   Preferred imaging location?    Answer:   External    Order Specific Question:   Radiology Contrast Protocol - do NOT remove file path    Answer:   [email or web address]    Order Specific Question:   Reason for Exam additional comments    Answer:   CT chest lung ca screening image showed Left adrenal enlargement is favored to represent hyperplasia.; Hepatomegaly

## 2017-03-03 ENCOUNTER — Other Ambulatory Visit: Payer: Self-pay | Admitting: Physician Assistant

## 2017-03-03 ENCOUNTER — Other Ambulatory Visit: Payer: Self-pay | Admitting: Internal Medicine

## 2017-03-03 DIAGNOSIS — E119 Type 2 diabetes mellitus without complications: Secondary | ICD-10-CM

## 2017-03-04 ENCOUNTER — Encounter: Payer: Self-pay | Admitting: Physician Assistant

## 2017-03-04 ENCOUNTER — Other Ambulatory Visit: Payer: Self-pay

## 2017-03-04 ENCOUNTER — Ambulatory Visit (INDEPENDENT_AMBULATORY_CARE_PROVIDER_SITE_OTHER): Payer: 59 | Admitting: Physician Assistant

## 2017-03-04 VITALS — BP 134/84 | HR 90 | Temp 98.1°F | Resp 18 | Ht 67.72 in | Wt 268.2 lb

## 2017-03-04 DIAGNOSIS — E785 Hyperlipidemia, unspecified: Secondary | ICD-10-CM | POA: Insufficient documentation

## 2017-03-04 DIAGNOSIS — R160 Hepatomegaly, not elsewhere classified: Secondary | ICD-10-CM

## 2017-03-04 DIAGNOSIS — R748 Abnormal levels of other serum enzymes: Secondary | ICD-10-CM | POA: Diagnosis not present

## 2017-03-04 DIAGNOSIS — E119 Type 2 diabetes mellitus without complications: Secondary | ICD-10-CM | POA: Diagnosis not present

## 2017-03-04 DIAGNOSIS — Z1231 Encounter for screening mammogram for malignant neoplasm of breast: Secondary | ICD-10-CM | POA: Diagnosis not present

## 2017-03-04 DIAGNOSIS — Z1239 Encounter for other screening for malignant neoplasm of breast: Secondary | ICD-10-CM

## 2017-03-04 LAB — POCT GLYCOSYLATED HEMOGLOBIN (HGB A1C): Hemoglobin A1C: 7.1

## 2017-03-04 MED ORDER — ROSUVASTATIN CALCIUM 40 MG PO TABS: 40.0000 mg | ORAL_TABLET | Freq: Every day | ORAL | 3 refills | Status: DC

## 2017-03-04 MED ORDER — SITAGLIPTIN PHOSPHATE 100 MG PO TABS: 100.0000 mg | ORAL_TABLET | Freq: Every day | ORAL | 0 refills | Status: DC

## 2017-03-04 MED ORDER — METFORMIN HCL 1000 MG PO TABS: 1000.0000 mg | ORAL_TABLET | Freq: Two times a day (BID) | ORAL | 0 refills | Status: DC

## 2017-03-04 NOTE — Patient Instructions (Addendum)
Congratulations on your hard work!  I am very proud of you for losing weight and for gaining better control of her sugars.  I recommend to continue your medications as prescribed.  I do recommend checking her sugars occasionally.  Your fasting blood sugar should be between 70 and 100.  Your labs are pending and should be resulted within a week.  In the meantime, I am going to check with our referral team to see about the GI referral.  You may also be contacted to have the abdominal CT.  Lastly, the other health maintenance you needs to update as your mammogram.  Below some information on how to schedule that.  We recommend that you schedule a mammogram for breast cancer screening. Typically, you do not need a referral to do this. Please contact a local imaging center to schedule your mammogram.  [facility] - [phone]  *ask for the Radiology Department [facility] ([place]) - [phone] or [phone]  [facility] - [phone] [place] [phone] [facility] - [phone]  *ask for the [facility] - [phone]  *ask for the Radiology Department [facility] - [phone]  *ask for the [place] - [phone]  Please pick up Miralax for moderate to severe constipation. Take this once a day for the next 2-3 days. Please also start docusate stool softener, twice a day for at least 1 week. If stools become loose, cut down to once a day for ~1 week. If stools remain loose, cut back to 1/2 pill for ~1 week. You can stop docusate thereafter and resume as needed for constipation.   Diabetes Mellitus and Food It is important for you to manage your blood sugar (glucose) level. Your blood glucose level can be greatly affected by what you eat. Eating healthier foods in the appropriate amounts throughout the day at about the same time each day  will help you control your blood glucose level. It can also help slow or prevent worsening of your diabetes mellitus. Healthy eating may even help you improve the level of your blood pressure and reach or maintain a healthy weight. General recommendations for healthful eating and cooking habits include:  Eating meals and snacks regularly. Avoid going long periods of time without eating to lose weight.  Eating a diet that consists mainly of plant-based foods, such as fruits, vegetables, nuts, legumes, and whole grains.  Using low-heat cooking methods, such as baking, instead of high-heat cooking methods, such as deep frying.  Work with your dietitian to make sure you understand how to use the Nutrition Facts information on food labels. How can food affect me? Carbohydrates Carbohydrates affect your blood glucose level more than any other type of food. Your dietitian will help you determine how many carbohydrates to eat at each meal and teach you how to count carbohydrates. Counting carbohydrates is important to keep your blood glucose at a healthy level, especially if you are using insulin or taking certain medicines for diabetes mellitus. Alcohol Alcohol can cause sudden decreases in blood glucose (hypoglycemia), especially if you use insulin or take certain medicines for diabetes mellitus. Hypoglycemia can be a life-threatening condition. Symptoms of hypoglycemia (sleepiness, dizziness, and disorientation) are similar to symptoms of having too much alcohol. If your health care provider has given you approval to drink alcohol, do so in moderation and use the following guidelines:  Women should not have  more than one drink per day, and men should not have more than two drinks per day. One drink is equal to: ? 12 oz of beer. ? 5 oz of wine. ? 1 oz of hard liquor.  Do not drink on an empty stomach.  Keep yourself hydrated. Have water, diet soda, or unsweetened iced tea.  Regular soda, juice,  and other mixers might contain a lot of carbohydrates and should be counted.  What foods are not recommended? As you make food choices, it is important to remember that all foods are not the same. Some foods have fewer nutrients per serving than other foods, even though they might have the same number of calories or carbohydrates. It is difficult to get your body what it needs when you eat foods with fewer nutrients. Examples of foods that you should avoid that are high in calories and carbohydrates but low in nutrients include:  Trans fats (most processed foods list trans fats on the Nutrition Facts label).  Regular soda.  Juice.  Candy.  Sweets, such as cake, pie, doughnuts, and cookies.  Fried foods.  What foods can I eat? Eat nutrient-rich foods, which will nourish your body and keep you healthy. The food you should eat also will depend on several factors, including:  The calories you need.  The medicines you take.  Your weight.  Your blood glucose level.  Your blood pressure level.  Your cholesterol level.  You should eat a variety of foods, including:  Protein. ? Lean cuts of meat. ? Proteins low in saturated fats, such as fish, egg whites, and beans. Avoid processed meats.  Fruits and vegetables. ? Fruits and vegetables that may help control blood glucose levels, such as apples, mangoes, and yams.  Dairy products. ? Choose fat-free or low-fat dairy products, such as milk, yogurt, and cheese.  Grains, bread, pasta, and rice. ? Choose whole grain products, such as multigrain bread, whole oats, and brown rice. These foods may help control blood pressure.  Fats. ? Foods containing healthful fats, such as nuts, avocado, olive oil, canola oil, and fish.  Does everyone with diabetes mellitus have the same meal plan? Because every person with diabetes mellitus is different, there is not one meal plan that works for everyone. It is very important that you meet with a  dietitian who will help you create a meal plan that is just right for you. This information is not intended to replace advice given to you by your health care provider. Make sure you discuss any questions you have with your health care provider. Document Released: 12/19/2004 Document Revised: 08/30/2015 Document Reviewed: 02/18/2013 Elsevier Interactive Patient Education  2017 [employer].   IF you received an x-ray today, you will receive an invoice from [facility]. Please contact [facility] at [phone] with questions or concerns regarding your invoice.   IF you received labwork today, you will receive an invoice from [facility]. Please contact [facility] at [phone] with questions or concerns regarding your invoice.   Our billing staff will not be able to assist you with questions regarding bills from these companies.  You will be contacted with the lab results as soon as they are available. The fastest way to get your results is to activate your My Chart account. Instructions are located on the last page of this paperwork. If you have not heard from [place] regarding the results in 2 weeks, please contact this office.

## 2017-03-04 NOTE — Progress Notes (Signed)
MRN: [id number]  Subjective:   Molly Payne is a 56 y.o. female who presents for follow up of Type 2 diabetes mellitus.   Diagnosis was made in 2015. Patient is currently managed with metformin 1000 mg twice and Januvia 100 mg daily.  The Januvia was initiated at last visit in 11/2016.  Admits excellent compliance. Denies adverse effects including metallic taste, hypoglycemia, nausea, vomiting.   Patient is not checking home blood sugars. Current symptoms include none. Patient denies foot ulcerations, nausea, paresthesia of the feet, polydipsia, polyuria, visual disturbances, vomiting and weight loss. Patient is checking their feet daily. No foot concerns. Last diabetic eye exam eye exam 12/2009, no diabetic retinopathy.  Diet has tremendously changed since last visit.  She notes she has cut out all red meat from her diet.  She is substituting this with [place].  She is also not eating out much at all.  Drinks mostly water.  She has lost 6 pounds since her last visit.  She is currently smoking.  Drinks alcohol occasionally.  Known diabetic complications: none  Immunizations: Flu vaccine 01/2016, pneumococal vaccine 11/2016  Other concerns: Patient has not followed up with GI doctor yet.  Notes she has not received a phone call from them.  She is being referred to them for elevated liver enzymes and suspicion of cirrhosis noted on CT chest for lung cancer screening.  Her prior PCP also noticed mild enlargement of the liver on imaging and recommended follow-up with GI doctor; however, patient does not remember ever seeing one.  Denies abdominal pain, nausea, and vomiting.   Objective:   PHYSICAL EXAM BP 134/84 (BP Location: Left Arm, Patient Position: Sitting, Cuff Size: Large)   Pulse 90   Temp 98.1 F (36.7 C) (Oral)   Resp 18   Ht 5' 7.72" (1.72 m)   Wt 268 lb 3.2 oz (121.7 kg)   SpO2 97%   BMI 41.12 kg/m   Physical Exam  Constitutional: She is oriented to person, place, and  time. She appears well-developed and well-nourished. No distress.  HENT:  Head: Normocephalic and atraumatic.  Mouth/Throat: Uvula is midline, oropharynx is clear and moist and mucous membranes are normal. Abnormal dentition.  Eyes: Conjunctivae are normal.  Neck: Normal range of motion.  Cardiovascular: Normal rate, regular rhythm, normal heart sounds and intact distal pulses.  Pulmonary/Chest: Effort normal and breath sounds normal. She has no wheezes. She has no rhonchi. She has no rales.  Abdominal: Soft. Normal appearance. There is no tenderness.  Neurological: She is alert and oriented to person, place, and time.  Skin: Skin is warm and dry.  Psychiatric: She has a normal mood and affect.  Vitals reviewed.   Results for orders placed or performed in visit on 03/04/17 (from the past 24 hour(s))  POCT glycosylated hemoglobin (Hb A1C)     Status: None   Collection Time: 03/04/17  8:37 AM  Result Value Ref Range   Hemoglobin A1C 7.1    Wt Readings from Last 3 Encounters:  03/04/17 268 lb 3.2 oz (121.7 kg)  02/12/17 271 lb 9.6 oz (123.2 kg)  02/09/17 272 lb (123.4 kg)     Assessment and Plan :  1. Type 2 diabetes mellitus without complication, without long-term current use of insulin ([facility]) A1c has decreased from 9.0 to 7.1 over the past 3 months.  Patient congratulated on her lifestyle modifications.  Continue with lifestyle modifications and current medication regimen at this time.  Patient recommended to check her  fasting blood sugars occasionally.  Goal is 70-100.  She was also congratulated on weight loss.  Plan to follow-up in 3 months for reevaluation. - CMP14+EGFR - CBC with Differential/Platelet - POCT glycosylated hemoglobin (Hb A1C) - Lipid panel - metFORMIN (GLUCOPHAGE) 1000 MG tablet; Take 1 tablet (1,000 mg total) by mouth 2 (two) times daily.  Dispense: 180 tablet; Refill: 0 - sitaGLIPtin (JANUVIA) 100 MG tablet; Take 1 tablet (100 mg total) by mouth daily.   Dispense: 90 tablet; Refill: 0 - rosuvastatin (CRESTOR) 40 MG tablet; Take 1 tablet (40 mg total) by mouth daily.  Dispense: 90 tablet; Refill: 3  2. Elevated liver enzymes 3. Hepatomegaly I will reach out to referral team and check on the status of the GI referral.  Patient encouraged to contact our office if she has not heard anything in 1-2 weeks. - CMP14+EGFR  4. Dyslipidemia Labs pending. - Lipid panel - rosuvastatin (CRESTOR) 40 MG tablet; Take 1 tablet (40 mg total) by mouth daily.  Dispense: 90 tablet; Refill: 3  5. Screening for breast cancer Patient given contact info to schedule mammogram.  [name], PA-C  [facility] 03/04/2017 12:46 PM

## 2017-03-05 LAB — CBC WITH DIFFERENTIAL/PLATELET
BASOS: 0 %
Basophils Absolute: 0 10*3/uL (ref 0.0–0.2)
EOS (ABSOLUTE): 0.1 10*3/uL (ref 0.0–0.4)
Eos: 1 %
Hematocrit: 45.4 % (ref 34.0–46.6)
Hemoglobin: 16 g/dL — ABNORMAL HIGH (ref 11.1–15.9)
IMMATURE GRANULOCYTES: 0 %
Immature Grans (Abs): 0 10*3/uL (ref 0.0–0.1)
Lymphocytes Absolute: 2.5 10*3/uL (ref 0.7–3.1)
Lymphs: 25 %
MCH: 30.9 pg (ref 26.6–33.0)
MCHC: 35.2 g/dL (ref 31.5–35.7)
MCV: 88 fL (ref 79–97)
MONOS ABS: 0.8 10*3/uL (ref 0.1–0.9)
Monocytes: 9 %
NEUTROS PCT: 65 %
Neutrophils Absolute: 6.4 10*3/uL (ref 1.4–7.0)
PLATELETS: 131 10*3/uL — AB (ref 150–379)
RBC: 5.18 x10E6/uL (ref 3.77–5.28)
RDW: 13.6 % (ref 12.3–15.4)
WBC: 9.9 10*3/uL (ref 3.4–10.8)

## 2017-03-05 LAB — CMP14+EGFR
A/G RATIO: 1.6 (ref 1.2–2.2)
ALK PHOS: 110 IU/L (ref 39–117)
ALT: 25 IU/L (ref 0–32)
AST: 37 IU/L (ref 0–40)
Albumin: 4.3 g/dL (ref 3.5–5.5)
BUN/Creatinine Ratio: 13 (ref 9–23)
BUN: 10 mg/dL (ref 6–24)
Bilirubin Total: 0.4 mg/dL (ref 0.0–1.2)
CO2: 22 mmol/L (ref 20–29)
Calcium: 9.5 mg/dL (ref 8.7–10.2)
Chloride: 100 mmol/L (ref 96–106)
Creatinine, Ser: 0.8 mg/dL (ref 0.57–1.00)
GFR calc Af Amer: 96 mL/min/{1.73_m2} (ref >59)
GFR calc non Af Amer: 83 mL/min/{1.73_m2} (ref >59)
GLOBULIN, TOTAL: 2.7 g/dL (ref 1.5–4.5)
Glucose: 106 mg/dL — ABNORMAL HIGH (ref 65–99)
POTASSIUM: 4.5 mmol/L (ref 3.5–5.2)
SODIUM: 139 mmol/L (ref 134–144)
Total Protein: 7 g/dL (ref 6.0–8.5)

## 2017-03-05 LAB — LIPID PANEL
CHOLESTEROL TOTAL: 122 mg/dL (ref 100–199)
Chol/HDL Ratio: 3.4 ratio (ref 0.0–4.4)
HDL: 36 mg/dL — ABNORMAL LOW (ref >39)
LDL Calculated: 57 mg/dL (ref 0–99)
TRIGLYCERIDES: 144 mg/dL (ref 0–149)
VLDL Cholesterol Cal: 29 mg/dL (ref 5–40)

## 2017-03-11 ENCOUNTER — Encounter: Payer: Self-pay | Admitting: Radiology

## 2017-04-21 ENCOUNTER — Ambulatory Visit (INDEPENDENT_AMBULATORY_CARE_PROVIDER_SITE_OTHER): Payer: 59 | Admitting: Family Medicine

## 2017-04-21 ENCOUNTER — Other Ambulatory Visit: Payer: Self-pay

## 2017-04-21 ENCOUNTER — Encounter: Payer: Self-pay | Admitting: Family Medicine

## 2017-04-21 VITALS — BP 132/78 | HR 90 | Temp 98.5°F | Resp 18 | Ht 67.72 in | Wt 270.0 lb

## 2017-04-21 DIAGNOSIS — J0100 Acute maxillary sinusitis, unspecified: Secondary | ICD-10-CM | POA: Diagnosis not present

## 2017-04-21 DIAGNOSIS — E119 Type 2 diabetes mellitus without complications: Secondary | ICD-10-CM | POA: Diagnosis not present

## 2017-04-21 DIAGNOSIS — Z720 Tobacco use: Secondary | ICD-10-CM | POA: Diagnosis not present

## 2017-04-21 MED ORDER — AMOXICILLIN-POT CLAVULANATE 875-125 MG PO TABS: 1.0000 | ORAL_TABLET | Freq: Two times a day (BID) | ORAL | 0 refills | Status: DC

## 2017-04-21 MED ORDER — FLUTICASONE PROPIONATE 50 MCG/ACT NA SUSP: NASAL | 1 refills | Status: DC

## 2017-04-21 NOTE — Progress Notes (Signed)
Patient ID: Molly Payne, female    DOB: December 31, 1960  Age: 57 y.o. MRN: [id number]  Chief Complaint  Patient presents with  . Fever    pt states she had a fever sunday night   . Facial Pain    pt states face is puffy   . Dental Pain    Subjective:  Low grade for 1-2 weeks, then got sick with fever Sunday. Fever finally broke late that night.  Still sick since. Purulent drainage. Chills.  History of sinus infections.  Facial pain to teeth bilaterally. Smokes 1ppd.  DM, fair control probably but doesn't check sugars.  Current allergies, medications, problem list, past/family and social histories reviewed.  Objective:  BP 132/78   Pulse 90   Temp 98.5 F (36.9 C) (Oral)   Resp 18   Ht 5' 7.72" (1.72 m)   Wt 270 lb (122.5 kg)   SpO2 94%   BMI 41.39 kg/m  Overweight.  Tender maxillary areas.  Throat clear.  Chest clear.  No nodes.  Assessment & Plan:   Assessment: 1. Acute non-recurrent maxillary sinusitis   2. Continuous tobacco abuse   3. Type 2 diabetes mellitus without complication, without long-term current use of insulin ([place])       Plan: rx for sinusitis, talked about stopping smoking  No orders of the defined types were placed in this encounter.   No orders of the defined types were placed in this encounter.        Patient Instructions   Drink plenty of fluids  Stay off work until Thursday  Take Augmentin 875 twice daily for infection  Take over the counter Mucinex to help thin the secretions  Use fluticasone nose spray twice daily for 4 days, then once daily  STOP SMOKING!  Return if not improving    IF you received an x-ray today, you will receive an invoice from [facility]. Please contact [facility] at [phone] with questions or concerns regarding your invoice.   IF you received labwork today, you will receive an invoice from [facility]. Please contact [facility] at [phone] with questions or concerns regarding your  invoice.   Our billing staff will not be able to assist you with questions regarding bills from these companies.  You will be contacted with the lab results as soon as they are available. The fastest way to get your results is to activate your My Chart account. Instructions are located on the last page of this paperwork. If you have not heard from [place] regarding the results in 2 weeks, please contact this office.        No Follow-up on file.   Nai Dasch, MD 04/21/2017

## 2017-04-21 NOTE — Patient Instructions (Addendum)
Drink plenty of fluids  Stay off work until Thursday  Take Augmentin 875 twice daily for infection  Take over the counter Mucinex to help thin the secretions  Use fluticasone nose spray twice daily for 4 days, then once daily  STOP SMOKING!  Return if not improving    IF you received an x-ray today, you will receive an invoice from [facility]. Please contact [facility] at [phone] with questions or concerns regarding your invoice.   IF you received labwork today, you will receive an invoice from [facility]. Please contact [facility] at [phone] with questions or concerns regarding your invoice.   Our billing staff will not be able to assist you with questions regarding bills from these companies.  You will be contacted with the lab results as soon as they are available. The fastest way to get your results is to activate your My Chart account. Instructions are located on the last page of this paperwork. If you have not heard from [place] regarding the results in 2 weeks, please contact this office.

## 2017-04-24 ENCOUNTER — Telehealth: Payer: Self-pay | Admitting: Physician Assistant

## 2017-04-24 NOTE — Telephone Encounter (Signed)
See attached.  Augmentin upsetting her stomach.  Pt of Dr. Timmothy Euler.

## 2017-04-24 NOTE — Telephone Encounter (Signed)
Copied from [place] [id number]. Topic: Quick Communication - See Telephone Encounter >> Apr 24, 2017 11:02 AM Boyd Kerbs wrote: CRM for notification. See Telephone encounter for:   Patient was in on Tuesday and doctor prescribed amoxicillin-clavulanate (AUGMENTIN) 875-125 MG tablet, she has been taking it but is upsetting stomach..  Pharmacist told her to eat with it and she has been, but feeling nausea.  Wanting to see if can get a different prescription?   [facility] [id number] [place], [place] - [place] [address] Phone: [phone] Fax: [phone]     04/24/17.

## 2017-04-27 ENCOUNTER — Ambulatory Visit: Payer: 59 | Admitting: Gastroenterology

## 2017-04-27 MED ORDER — DOXYCYCLINE HYCLATE 100 MG PO CAPS: 100.0000 mg | ORAL_CAPSULE | Freq: Two times a day (BID) | ORAL | 0 refills | Status: DC

## 2017-04-27 NOTE — Addendum Note (Signed)
Addended by: [name] D on: 04/27/2017 08:35 AM   Modules accepted: Orders

## 2017-04-27 NOTE — Telephone Encounter (Signed)
Meds ordered this encounter  Medications  . doxycycline (VIBRAMYCIN) 100 MG capsule    Sig: Take 1 capsule (100 mg total) by mouth 2 (two) times daily.    Dispense:  14 capsule    Refill:  0    Order Specific Question:   Supervising Provider    Answer:   Wardell Honour [[id number]]

## 2017-06-19 ENCOUNTER — Other Ambulatory Visit: Payer: Self-pay

## 2017-06-19 ENCOUNTER — Ambulatory Visit (INDEPENDENT_AMBULATORY_CARE_PROVIDER_SITE_OTHER): Payer: 59 | Admitting: Physician Assistant

## 2017-06-19 ENCOUNTER — Encounter: Payer: Self-pay | Admitting: Physician Assistant

## 2017-06-19 VITALS — BP 132/76 | HR 101 | Temp 98.5°F | Ht 67.5 in | Wt 271.0 lb

## 2017-06-19 DIAGNOSIS — M545 Low back pain, unspecified: Secondary | ICD-10-CM

## 2017-06-19 DIAGNOSIS — M62838 Other muscle spasm: Secondary | ICD-10-CM | POA: Diagnosis not present

## 2017-06-19 LAB — POCT URINALYSIS DIP (MANUAL ENTRY)
BILIRUBIN UA: NEGATIVE
Blood, UA: NEGATIVE
GLUCOSE UA: NEGATIVE mg/dL
Ketones, POC UA: NEGATIVE mg/dL
LEUKOCYTES UA: NEGATIVE
NITRITE UA: NEGATIVE
Protein Ur, POC: NEGATIVE mg/dL
Spec Grav, UA: 1.02 (ref 1.010–1.025)
UROBILINOGEN UA: 0.2 U/dL
pH, UA: 6 (ref 5.0–8.0)

## 2017-06-19 MED ORDER — MELOXICAM 7.5 MG PO TABS: 7.5000 mg | ORAL_TABLET | Freq: Every day | ORAL | 0 refills | Status: DC | PRN

## 2017-06-19 MED ORDER — CYCLOBENZAPRINE HCL 5 MG PO TABS: 5.0000 mg | ORAL_TABLET | Freq: Three times a day (TID) | ORAL | 0 refills | Status: DC | PRN

## 2017-06-19 NOTE — Patient Instructions (Addendum)
I recommend resting today. However, tomorrow I would begin walking and moving around as much as tolerated. Begin stretching in a couple of days. The worse thing you can do for low back pain is lie in bed all day or sit down all day. Use medications as needed.   Just to know, flexeril can cause side effects that may impair your thinking or reactions. Be careful if you drive or do anything that requires you to be awake and alert. void drinking alcohol, which can increase some of the side effects of Flexeril.  NSAIDs like meloxicam have common side effects of heartburn, stomach pain, indigestion, and headache. Could lead to renal insufficiency, stroke, or GI bleed if taken excess amounts outside of what is recommended on label long term.    You should avoid heavy lifting or strenuous repetitive activity to prevent recurrence of event. Experiment with both ice and heat and choose whichever feels best for you.  Use heat pad or ice pack, do not apply directly to skin, use barrier such as towel over the skin. Leave on for 15-20 minutes, 3-4 times a day.  Please perform exercises below. Stretches are to be performed for 2 sets, holding 10-15 seconds each. Recommended to perform this rehab twice daily within pain tolerance for 2 weeks.   FLEXION RANGE OF MOTION AND STRETCHING EXERCISES: STRETCH - Flexion, Single Knee to Chest   Lie on a firm bed or floor with both legs extended in front of you.  Keeping one leg in contact with the floor, bring your opposite knee to your chest. Hold your leg in place by either grabbing behind your thigh or at your knee.  Pull until you feel a gentle stretch in your lower back.   Slowly release your grasp and repeat the exercise with the opposite side.  STRETCH - Flexion, Double Knee to Chest   Lie on a firm bed or floor with both legs extended in front of you.  Keeping one leg in contact with the floor, bring your opposite knee to your chest.  Tense your stomach  muscles to support your back and then lift your other knee to your chest. Hold your legs in place by either grabbing behind your thighs or at your knees.  Pull both knees toward your chest until you feel a gentle stretch in your lower back.   Tense your stomach muscles and slowly return one leg at a time to the floor.  STRETCH - Low Trunk Rotation  Lie on a firm bed or floor. Keeping your legs in front of you, bend your knees so they are both pointed toward the ceiling and your feet are flat on the floor.  Extend your arms out to the side. This will stabilize your upper body by keeping your shoulders in contact with the floor.  Gently and slowly drop both knees together to one side until you feel a gentle stretch in your lower back.   Tense your stomach muscles to support your lower back as you bring your knees back to the starting position. Repeat the exercise to the other side.   EXTENSION RANGE OF MOTION AND FLEXIBILITY EXERCISES: STRETCH - Extension, Prone on Elbows   Lie on your stomach on the floor, a bed will be too soft. Place your palms about shoulder width apart and at the height of your head.  Place your elbows under your shoulders. If this is too painful, stack pillows under your chest.  Allow your body to relax  so that your hips drop lower and make contact more completely with the floor.  Slowly return to lying flat on the floor.  RANGE OF MOTION - Extension, Prone Press Ups  Lie on your stomach on the floor, a bed will be too soft. Place your palms about shoulder width apart and at the height of your head.  Keeping your back as relaxed as possible, slowly straighten your elbows while keeping your hips on the floor. You may adjust the placement of your hands to maximize your comfort. As you gain motion, your hands will come more underneath your shoulders.  Slowly return to lying flat on the floor.  RANGE OF MOTION- Quadruped, Neutral Spine   Assume a hands and knees  position on a firm surface. Keep your hands under your shoulders and your knees under your hips. You may place padding under your knees for comfort.  Drop your head and point your tail bone toward the ground below you. This will round out your lower back like an angry cat.    Slowly lift your head and release your tail bone so that your back sags into a large arch, like an old horse.  Repeat this until you feel limber in your lower back.  Now, find your "sweet spot." This will be the most comfortable position somewhere between the two previous positions. This is your neutral spine. Once you have found this position, tense your stomach muscles to support your lower back.  STRENGTHENING EXERCISES - Low Back Strain These exercises may help you when beginning to rehabilitate your injury. These exercises should be done near your "sweet spot." This is the neutral, low-back arch, somewhere between fully rounded and fully arched, that is your least painful position. When performed in this safe range of motion, these exercises can be used for people who have either a flexion or extension based injury. These exercises may resolve your symptoms with or without further involvement from your physician, physical therapist or athletic trainer. While completing these exercises, remember:   Muscles can gain both the endurance and the strength needed for everyday activities through controlled exercises.  Complete these exercises as instructed by your physician, physical therapist or athletic trainer. Increase the resistance and repetitions only as guided.  You may experience muscle soreness or fatigue, but the pain or discomfort you are trying to eliminate should never worsen during these exercises. If this pain does worsen, stop and make certain you are following the directions exactly. If the pain is still present after adjustments, discontinue the exercise until you can discuss the trouble with your  caregiver.  STRENGTHENING - Deep Abdominals, Pelvic Tilt  Lie on a firm bed or floor. Keeping your legs in front of you, bend your knees so they are both pointed toward the ceiling and your feet are flat on the floor.  Tense your lower abdominal muscles to press your lower back into the floor. This motion will rotate your pelvis so that your tail bone is scooping upwards rather than pointing at your feet or into the floor.  STRENGTHENING - Abdominals, Crunches   Lie on a firm bed or floor. Keeping your legs in front of you, bend your knees so they are both pointed toward the ceiling and your feet are flat on the floor. Cross your arms over your chest.  Slightly tip your chin down without bending your neck.  Tense your abdominals and slowly lift your trunk high enough to just clear your shoulder blades. Lifting  higher can put excessive stress on the lower back and does not further strengthen your abdominal muscles.  Control your return to the starting position.  STRENGTHENING - Quadruped, Opposite UE/LE Lift   Assume a hands and knees position on a firm surface. Keep your hands under your shoulders and your knees under your hips. You may place padding under your knees for comfort.  Find your neutral spine and gently tense your abdominal muscles so that you can maintain this position. Your shoulders and hips should form a rectangle that is parallel with the floor and is not twisted.  Keeping your trunk steady, lift your right hand no higher than your shoulder and then your left leg no higher than your hip. Make sure you are not holding your breath.   Continuing to keep your abdominal muscles tense and your back steady, slowly return to your starting position. Repeat with the opposite arm and leg.  STRENGTHENING - Lower Abdominals, Double Knee Lift  Lie on a firm bed or floor. Keeping your legs in front of you, bend your knees so they are both pointed toward the ceiling and your feet are  flat on the floor.  Tense your abdominal muscles to brace your lower back and slowly lift both of your knees until they come over your hips. Be certain not to hold your breath.  POSTURE AND BODY MECHANICS CONSIDERATIONS - Low Back Strain Keeping correct posture when sitting, standing or completing your activities will reduce the stress put on different body tissues, allowing injured tissues a chance to heal and limiting painful experiences. The following are general guidelines for improved posture. Your physician or physical therapist will provide you with any instructions specific to your needs. While reading these guidelines, remember:  The exercises prescribed by your provider will help you have the flexibility and strength to maintain correct postures.  The correct posture provides the best environment for your joints to work. All of your joints have less wear and tear when properly supported by a spine with good posture. This means you will experience a healthier, less painful body.  Correct posture must be practiced with all of your activities, especially prolonged sitting and standing. Correct posture is as important when doing repetitive low-stress activities (typing) as it is when doing a single heavy-load activity (lifting). RESTING POSITIONS Consider which positions are most painful for you when choosing a resting position. If you have pain with flexion-based activities (sitting, bending, stooping, squatting), choose a position that allows you to rest in a less flexed posture. You would want to avoid curling into a fetal position on your side. If your pain worsens with extension-based activities (prolonged standing, working overhead), avoid resting in an extended position such as sleeping on your stomach. Most people will find more comfort when they rest with their spine in a more neutral position, neither too rounded nor too arched. Lying on a non-sagging bed on your side with a pillow  between your knees, or on your back with a pillow under your knees will often provide some relief. Keep in mind, being in any one position for a prolonged period of time, no matter how correct your posture, can still lead to stiffness. PROPER SITTING POSTURE In order to minimize stress and discomfort on your spine, you must sit with correct posture. Sitting with good posture should be effortless for a healthy body. Returning to good posture is a gradual process. Many people can work toward this most comfortably by using various supports  until they have the flexibility and strength to maintain this posture on their own. When sitting with proper posture, your ears will fall over your shoulders and your shoulders will fall over your hips. You should use the back of the chair to support your upper back. Your lower back will be in a neutral position, just slightly arched. You may place a small pillow or folded towel at the base of your lower back for support.  When working at a desk, create an environment that supports good, upright posture. Without extra support, muscles tire, which leads to excessive strain on joints and other tissues. Keep these recommendations in mind: CHAIR:  A chair should be able to slide under your desk when your back makes contact with the back of the chair. This allows you to work closely.  The chair's height should allow your eyes to be level with the upper part of your monitor and your hands to be slightly lower than your elbows. BODY POSITION  Your feet should make contact with the floor. If this is not possible, use a foot rest.  Keep your ears over your shoulders. This will reduce stress on your neck and lower back. INCORRECT SITTING POSTURES  If you are feeling tired and unable to assume a healthy sitting posture, do not slouch or slump. This puts excessive strain on your back tissues, causing more damage and pain. Healthier options include:  Using more support, like a  lumbar pillow.  Switching tasks to something that requires you to be upright or walking.  Talking a brief walk.  Lying down to rest in a neutral-spine position. PROLONGED STANDING WHILE SLIGHTLY LEANING FORWARD  When completing a task that requires you to lean forward while standing in one place for a long time, place either foot up on a stationary 2-4 inch high object to help maintain the best posture. When both feet are on the ground, the lower back tends to lose its slight inward curve. If this curve flattens (or becomes too large), then the back and your other joints will experience too much stress, tire more quickly, and can cause pain. CORRECT STANDING POSTURES Proper standing posture should be assumed with all daily activities, even if they only take a few moments, like when brushing your teeth. As in sitting, your ears should fall over your shoulders and your shoulders should fall over your hips. You should keep a slight tension in your abdominal muscles to brace your spine. Your tailbone should point down to the ground, not behind your body, resulting in an over-extended swayback posture.  INCORRECT STANDING POSTURES  Common incorrect standing postures include a forward head, locked knees and/or an excessive swayback. WALKING Walk with an upright posture. Your ears, shoulders and hips should all line-up. PROLONGED ACTIVITY IN A FLEXED POSITION When completing a task that requires you to bend forward at your waist or lean over a low surface, try to find a way to stabilize 3 out of 4 of your limbs. You can place a hand or elbow on your thigh or rest a knee on the surface you are reaching across. This will provide you more stability so that your muscles do not fatigue as quickly. By keeping your knees relaxed, or slightly bent, you will also reduce stress across your lower back. CORRECT LIFTING TECHNIQUES DO :   Assume a wide stance. This will provide you more stability and the opportunity  to get as close as possible to the object which you are  lifting.  Tense your abdominals to brace your spine. Bend at the knees and hips. Keeping your back locked in a neutral-spine position, lift using your leg muscles. Lift with your legs, keeping your back straight.  Test the weight of unknown objects before attempting to lift them.  Try to keep your elbows locked down at your sides in order get the best strength from your shoulders when carrying an object.  Always ask for help when lifting heavy or awkward objects. INCORRECT LIFTING TECHNIQUES DO NOT:   Lock your knees when lifting, even if it is a small object.  Bend and twist. Pivot at your feet or move your feet when needing to change directions.  Assume that you can safely pick up even a paper clip without proper posture.       IF you received an x-ray today, you will receive an invoice from [facility]. Please contact [facility] at [phone] with questions or concerns regarding your invoice.   IF you received labwork today, you will receive an invoice from [facility]. Please contact [facility] at [phone] with questions or concerns regarding your invoice.   Our billing staff will not be able to assist you with questions regarding bills from these companies.  You will be contacted with the lab results as soon as they are available. The fastest way to get your results is to activate your My Chart account. Instructions are located on the last page of this paperwork. If you have not heard from [place] regarding the results in 2 weeks, please contact this office.

## 2017-06-19 NOTE — Progress Notes (Signed)
SHANETRA BLUMENSTOCK  MRN: [id number] DOB: 1960/12/21  Subjective:   Molly Payne is a 57 y.o. female who presents for evaluation of low back pain. The patient has had recurrent self limited episodes of low back pain in the past. Symptoms have been present for 4 days and are unchanged.  Onset was related to / precipitated by no known injury. The pain is located in the right lumbar area and does not radiate. The pain is described as burning and occurs all day. She rates her pain as a 4 on a scale of 0-10. Symptoms are exacerbated by lifting, sitting and twisting.  Has not tried anything for relief.  . She has also tried nothing which provided no symptom relief. She has weakness in the right leg, weakness in the left leg, tingling in the right leg, tingling in the left leg, burning pain in the right leg, burning pain in the left leg, urinary hesitancy, urinary incontinence, urinary retention, bowel incontinence, impotence and groin/perineal numbness associated with the back pain. The patient has no "red flag" history indicative of complicated back pain.   Review of Systems  Constitutional: Negative for chills, diaphoresis and fever.  Genitourinary: Negative for flank pain and hematuria.    Patient Active Problem List   Diagnosis Date Noted  . Agatston coronary artery calcium score less than 100 11/05/2015  . Cough due to ACE inhibitor 11/05/2015  . Dyslipidemia 09/20/2015  . Family history of heart disease 09/20/2015  . Smoker 09/20/2015  . Screening for cardiovascular condition 09/20/2015  . Diabetes mellitus type 2 in obese ([place]) 08/09/2014  . Morbid obesity ([place]) 08/09/2014  . Smoking 12/23/2012  . Diabetes mellitus ([place]) 04/29/2012  . Obesity 12/11/2011    Current Outpatient Medications on File Prior to Visit  Medication Sig Dispense Refill  . aspirin 81 MG tablet Take 81 mg by mouth daily.    . blood glucose meter kit and supplies KIT Dispense based on patient and insurance  preference. Use up to four times daily as directed. (FOR ICD-10 E11.69). 1 each 0  . cetirizine (ZYRTEC) 10 MG tablet Take 1 tablet (10 mg total) by mouth daily. 30 tablet 11  . lidocaine (XYLOCAINE) 2 % solution Use as directed 5 mLs in the mouth or throat as needed for mouth pain. 100 mL 0  . losartan (COZAAR) 25 MG tablet TAKE ONE TABLET BY MOUTH ONCE DAILY 90 tablet 2  . metFORMIN (GLUCOPHAGE) 1000 MG tablet Take 1 tablet (1,000 mg total) by mouth 2 (two) times daily. 180 tablet 0  . nitrofurantoin, macrocrystal-monohydrate, (MACROBID) 100 MG capsule Take 1 capsule (100 mg total) 2 (two) times daily by mouth. 14 capsule 0  . Omega-3 Fatty Acids (FISH OIL) 1000 MG CAPS Take 2 capsules by mouth daily.    . phenazopyridine (PYRIDIUM) 100 MG tablet Take 1 tablet (100 mg total) 3 (three) times daily as needed by mouth for pain. 6 tablet 0  . rosuvastatin (CRESTOR) 40 MG tablet Take 1 tablet (40 mg total) by mouth daily. 90 tablet 3  . sitaGLIPtin (JANUVIA) 100 MG tablet Take 1 tablet (100 mg total) by mouth daily. 90 tablet 0  . amoxicillin-clavulanate (AUGMENTIN) 875-125 MG tablet Take 1 tablet by mouth 2 (two) times daily. (Patient not taking: Reported on 06/19/2017) 20 tablet 0  . doxycycline (VIBRAMYCIN) 100 MG capsule Take 1 capsule (100 mg total) by mouth 2 (two) times daily. (Patient not taking: Reported on 06/19/2017) 14 capsule 0  . fluticasone (  FLONASE) 50 MCG/ACT nasal spray Use 2 sprays each nostril twice daily for 5 days, then decrease to once daily (Patient not taking: Reported on 06/19/2017) 16 g 1  . Ipratropium-Albuterol (COMBIVENT) 20-100 MCG/ACT AERS respimat Inhale 1 puff into the lungs 4 (four) times daily. (Patient not taking: Reported on 06/19/2017) 4 g 0   No current facility-administered medications on file prior to visit.     No Known Allergies   Objective:  BP 132/76 (BP Location: Left Arm, Patient Position: Sitting, Cuff Size: Large)   Pulse (!) 101   Temp 98.5 F (36.9  C) (Oral)   Ht 5' 7.5" (1.715 m)   Wt 271 lb (122.9 kg)   SpO2 95%   BMI 41.82 kg/m   Physical Exam  Constitutional: She is oriented to person, place, and time and well-developed, well-nourished, and in no distress.  HENT:  Head: Normocephalic and atraumatic.  Eyes: Conjunctivae are normal.  Neck: Normal range of motion.  Pulmonary/Chest: Effort normal.  Abdominal: Soft. Normal appearance. There is no tenderness. There is no CVA tenderness.  Musculoskeletal:       Cervical back: Normal.       Thoracic back: She exhibits tenderness (with palpation of spasm in right sided musculature). She exhibits normal range of motion and no bony tenderness.       Lumbar back: Normal. She exhibits normal range of motion, no tenderness and no bony tenderness.       Arms: Neurological: She is alert and oriented to person, place, and time. Gait normal.  Reflex Scores:      Patellar reflexes are 2+ on the right side and 2+ on the left side.      Achilles reflexes are 2+ on the right side and 2+ on the left side. Muscular strength of bilateral lower extremities 5/5.  Sensation of bilateral lower extremities intact.   Skin: Skin is warm and dry.  Psychiatric: Affect normal.  Vitals reviewed.    Results for orders placed or performed in visit on 06/19/17 (from the past 24 hour(s))  POCT urinalysis dipstick     Status: Abnormal   Collection Time: 06/19/17 11:35 AM  Result Value Ref Range   Color, UA straw (A) yellow   Clarity, UA clear clear   Glucose, UA negative negative mg/dL   Bilirubin, UA negative negative   Ketones, POC UA negative negative mg/dL   Spec Grav, UA 1.020 1.010 - 1.025   Blood, UA negative negative   pH, UA 6.0 5.0 - 8.0   Protein Ur, POC negative negative mg/dL   Urobilinogen, UA 0.2 0.2 or 1.0 E.U./dL   Nitrite, UA Negative Negative   Leukocytes, UA Negative Negative    Wt Readings from Last 3 Encounters:  06/19/17 271 lb (122.9 kg)  04/21/17 270 lb (122.5 kg)    03/04/17 268 lb 3.2 oz (121.7 kg)    Assessment and Plan :  1. Acute right-sided low back pain without sciatica Hx and PE findings consistent with muscle spasm. UA normal. Neuro exam normal. No midline tenderness. Recommended stretching, oral hydration, heating pad, and NSAIDS ans muscle relaxant as needed. Advised to return to clinic if symptoms worsen, do not improve, or as needed.  - POCT urinalysis dipstick - meloxicam (MOBIC) 7.5 MG tablet; Take 1-2 tablets (7.5-15 mg total) by mouth daily as needed for pain.  Dispense: 30 tablet; Refill: 0  2. Muscle spasm - cyclobenzaprine (FLEXERIL) 5 MG tablet; Take 1 tablet (5 mg total) by mouth 3 (  three) times daily as needed for muscle spasms.  Dispense: 60 tablet; Refill: 0  [name] PA-C  [facility] at [facility] 06/19/2017 11:39 AM

## 2017-06-23 ENCOUNTER — Other Ambulatory Visit: Payer: Self-pay | Admitting: Internal Medicine

## 2017-07-12 ENCOUNTER — Other Ambulatory Visit: Payer: Self-pay | Admitting: Physician Assistant

## 2017-07-12 DIAGNOSIS — E119 Type 2 diabetes mellitus without complications: Secondary | ICD-10-CM

## 2017-07-27 ENCOUNTER — Ambulatory Visit (INDEPENDENT_AMBULATORY_CARE_PROVIDER_SITE_OTHER): Payer: 59 | Admitting: Physician Assistant

## 2017-07-27 ENCOUNTER — Ambulatory Visit: Payer: 59 | Admitting: Physician Assistant

## 2017-07-27 ENCOUNTER — Other Ambulatory Visit: Payer: Self-pay

## 2017-07-27 VITALS — BP 128/84 | HR 89 | Temp 97.8°F | Resp 16 | Ht 67.0 in | Wt 271.0 lb

## 2017-07-27 DIAGNOSIS — E119 Type 2 diabetes mellitus without complications: Secondary | ICD-10-CM | POA: Diagnosis not present

## 2017-07-27 DIAGNOSIS — R110 Nausea: Secondary | ICD-10-CM | POA: Diagnosis not present

## 2017-07-27 LAB — POCT URINALYSIS DIP (MANUAL ENTRY)
BILIRUBIN UA: NEGATIVE
Blood, UA: NEGATIVE
GLUCOSE UA: NEGATIVE mg/dL
Ketones, POC UA: NEGATIVE mg/dL
Leukocytes, UA: NEGATIVE
NITRITE UA: NEGATIVE
Protein Ur, POC: NEGATIVE mg/dL
SPEC GRAV UA: 1.01 (ref 1.010–1.025)
Urobilinogen, UA: 0.2 E.U./dL
pH, UA: 6 (ref 5.0–8.0)

## 2017-07-27 LAB — GLUCOSE, POCT (MANUAL RESULT ENTRY): POC Glucose: 218 mg/dl — AB (ref 70–99)

## 2017-07-27 MED ORDER — ONDANSETRON 8 MG PO TBDP: 8.0000 mg | ORAL_TABLET | Freq: Three times a day (TID) | ORAL | 0 refills | Status: DC | PRN

## 2017-07-27 NOTE — Patient Instructions (Addendum)
Start with half tablet at night of the [place].  Then increase after 3 days.    Nausea, Adult Feeling sick to your stomach (nausea) means that your stomach is upset or you feel like you have to throw up (vomit). Feeling sick to your stomach is usually not serious, but it may be an early sign of a more serious medical problem. As you feel sicker to your stomach, it can lead to throwing up (vomiting). If you throw up, or if you are not able to drink enough fluids, there is a risk of dehydration. Dehydration can make you feel tired and thirsty, have a dry mouth, and pee (urinate) less often. Older adults and people who have other diseases or a weak defense (immune) system have a higher risk of dehydration. The main goal of treating this condition is to:  Limit how often you feel sick to your stomach.  Prevent throwing up and dehydration.  Follow these instructions at home: Follow instructions from your doctor about how to care for yourself at home. Eating and drinking Follow these recommendations as told by your doctor:  Take an oral rehydration solution (ORS). This is a drink that is sold at pharmacies and stores.  Drink clear fluids in small amounts as you are able, such as: ? Water. ? Ice chips. ? Fruit juice that has water added (diluted fruit juice). ? Low-calorie sports drinks.  Eat bland, easy to digest foods in small amounts as you are able, such as: ? Bananas. ? Applesauce. ? Rice. ? Lean meats. ? Toast. ? Crackers.  Avoid drinking fluids that contain a lot of sugar or caffeine.  Avoid alcohol.  Avoid spicy or fatty foods.  General instructions  Drink enough fluid to keep your pee (urine) clear or pale yellow.  Wash your hands often. If you cannot use soap and water, use hand sanitizer.  Make sure that all people in your household wash their hands well and often.  Rest at home while you get better.  Take over-the-counter and prescription medicines only as told by  your doctor.  Breathe slowly and deeply when you feel sick to your stomach.  Watch your condition for any changes.  Keep all follow-up visits as told by your doctor. This is important. Contact a doctor if:  You have a headache.  You have new symptoms.  You feel sicker to your stomach.  You have a fever.  You feel light-headed or dizzy.  You throw up.  You are not able to keep fluids down. Get help right away if:  You have pain in your chest, neck, arm, or jaw.  You feel very weak or you pass out (faint).  You have throw up that is bright red or looks like coffee grounds.  You have bloody or black poop (stools), or poop that looks like tar.  You have a very bad headache, a stiff neck, or both.  You have very bad pain, cramping, or bloating in your belly.  You have a rash.  You have trouble breathing or you are breathing very quickly.  Your heart is beating very quickly.  Your skin feels cold and clammy.  You feel confused.  You have pain while peeing.  You have signs of dehydration, such as: ? Dark pee, or very little or no pee. ? Cracked lips. ? Dry mouth. ? Sunken eyes. ? Sleepiness. ? Weakness. These symptoms may be an emergency. Do not wait to see if the symptoms will go away. Get  medical help right away. Call your local emergency services (911 in the [place] Do not drive yourself to the hospital. This information is not intended to replace advice given to you by your health care provider. Make sure you discuss any questions you have with your health care provider. Document Released: 03/13/2011 Document Revised: 08/30/2015 Document Reviewed: 11/28/2014 Elsevier Interactive Patient Education  [employer].

## 2017-07-27 NOTE — Progress Notes (Deleted)
PRIMARY CARE AT [facility] [address], [place] [address] [phone]  Date:  07/27/2017   Name:  Molly Payne   DOB:  12-28-1960   MRN:  [id number]  PCP:  Leonie Douglas, PA-C    History of Present Illness:  Molly Payne is a 57 y.o. female patient who presents to PCP with  Chief Complaint  Patient presents with  . Nausea    pt states the feeling comes and goes, at times she gets dizzy. x 1 wk. pt starting back taking [place] after some time and thinks thats why she may feel nausea       Patient Active Problem List   Diagnosis Date Noted  . Agatston coronary artery calcium score less than 100 11/05/2015  . Cough due to ACE inhibitor 11/05/2015  . Dyslipidemia 09/20/2015  . Family history of heart disease 09/20/2015  . Smoker 09/20/2015  . Screening for cardiovascular condition 09/20/2015  . Diabetes mellitus type 2 in obese ([place]) 08/09/2014  . Morbid obesity ([place]) 08/09/2014  . Smoking 12/23/2012  . Diabetes mellitus ([place]) 04/29/2012  . Obesity 12/11/2011    Past Medical History:  Diagnosis Date  . Allergy   . Diabetes mellitus without complication ([place])   . Hyperlipidemia     Past Surgical History:  Procedure Laterality Date  . ABDOMINAL HYSTERECTOMY    . APPENDECTOMY    . CHOLECYSTECTOMY      Social History   Tobacco Use  . Smoking status: Current Every Day Smoker    Packs/day: 1.00    Years: 32.00    Pack years: 32.00    Types: Cigarettes  . Smokeless tobacco: Never Used  Substance Use Topics  . Alcohol use: No    Alcohol/week: 0.0 oz  . Drug use: No    Family History  Problem Relation Age of Onset  . Heart disease Mother   . Heart disease Sister   . Heart disease Sister   . Heart attack Sister   . Colon cancer Neg Hx   . Colon polyps Neg Hx   . Esophageal cancer Neg Hx   . Rectal cancer Neg Hx   . Stomach cancer Neg Hx     No Known Allergies  Medication list has been reviewed and updated.  Current Outpatient Medications  on File Prior to Visit  Medication Sig Dispense Refill  . aspirin 81 MG tablet Take 81 mg by mouth daily.    . blood glucose meter kit and supplies KIT Dispense based on patient and insurance preference. Use up to four times daily as directed. (FOR ICD-10 E11.69). 1 each 0  . cetirizine (ZYRTEC) 10 MG tablet Take 1 tablet (10 mg total) by mouth daily. 30 tablet 11  . cyclobenzaprine (FLEXERIL) 5 MG tablet Take 1 tablet (5 mg total) by mouth 3 (three) times daily as needed for muscle spasms. 60 tablet 0  . fluticasone (FLONASE) 50 MCG/ACT nasal spray Use 2 sprays each nostril twice daily for 5 days, then decrease to once daily 16 g 1  . Ipratropium-Albuterol (COMBIVENT) 20-100 MCG/ACT AERS respimat Inhale 1 puff into the lungs 4 (four) times daily. 4 g 0  . lidocaine (XYLOCAINE) 2 % solution Use as directed 5 mLs in the mouth or throat as needed for mouth pain. 100 mL 0  . losartan (COZAAR) 25 MG tablet TAKE 1 TABLET BY MOUTH ONCE DAILY 90 tablet 0  . meloxicam (MOBIC) 7.5 MG tablet Take 1-2 tablets (7.5-15 mg total) by  mouth daily as needed for pain. 30 tablet 0  . metFORMIN (GLUCOPHAGE) 1000 MG tablet Take 1 tablet (1,000 mg total) by mouth 2 (two) times daily. 180 tablet 0  . nitrofurantoin, macrocrystal-monohydrate, (MACROBID) 100 MG capsule Take 1 capsule (100 mg total) 2 (two) times daily by mouth. 14 capsule 0  . Omega-3 Fatty Acids (FISH OIL) 1000 MG CAPS Take 2 capsules by mouth daily.    . phenazopyridine (PYRIDIUM) 100 MG tablet Take 1 tablet (100 mg total) 3 (three) times daily as needed by mouth for pain. 6 tablet 0  . rosuvastatin (CRESTOR) 40 MG tablet Take 1 tablet (40 mg total) by mouth daily. 90 tablet 3  . sitaGLIPtin (JANUVIA) 100 MG tablet Take 1 tablet (100 mg total) by mouth daily. Needs office visit for additional refills 30 tablet 0   No current facility-administered medications on file prior to visit.     ROS ROS otherwise unremarkable unless listed above.  Physical  Examination: BP 128/84   Pulse 89   Temp 97.8 F (36.6 C) (Oral)   Resp 16   Ht '5\' 7"'  (1.702 m)   Wt 271 lb (122.9 kg)   SpO2 98%   BMI 42.44 kg/m  Ideal Body Weight: Weight in (lb) to have BMI = 25: 159.3  Physical Exam   Assessment and Plan: Molly Payne is a 57 y.o. female who is here today  There are no diagnoses linked to this encounter.  Ivar Drape, PA-C [facility] 07/27/2017 12:01 PM

## 2017-07-27 NOTE — Progress Notes (Signed)
PRIMARY CARE AT [facility] [address], [place] [address] [phone]  Date:  07/27/2017   Name:  Molly Payne   DOB:  07-24-1960   MRN:  [id number]  PCP:  Leonie Douglas, PA-C    History of Present Illness:  Molly Payne is a 57 y.o. female patient who presents to PCP with  Chief Complaint  Patient presents with  . Nausea    pt states the feeling comes and goes, at times she gets dizzy. x 1 wk. pt starting back taking [place] after some time and thinks thats why she may feel nausea     She started to take the [place] 1 week ago,  She notes that 1 week ago with dizziness.  She does not feel that the room is spinning, but feels that she is "kind of off She was not taking the [place] for about 2 weeks.   She stopped taking the [place] for 2 days.   No abdominal pain No pain with urination, hematuria, or frequency She has reaction to the pollen with coughing, watery eyes.   No fever.   She denies pre-syncope.   Patient Active Problem List   Diagnosis Date Noted  . Agatston coronary artery calcium score less than 100 11/05/2015  . Cough due to ACE inhibitor 11/05/2015  . Dyslipidemia 09/20/2015  . Family history of heart disease 09/20/2015  . Smoker 09/20/2015  . Screening for cardiovascular condition 09/20/2015  . Diabetes mellitus type 2 in obese ([place]) 08/09/2014  . Morbid obesity ([place]) 08/09/2014  . Smoking 12/23/2012  . Diabetes mellitus ([place]) 04/29/2012  . Obesity 12/11/2011    Past Medical History:  Diagnosis Date  . Allergy   . Diabetes mellitus without complication ([place])   . Hyperlipidemia     Past Surgical History:  Procedure Laterality Date  . ABDOMINAL HYSTERECTOMY    . APPENDECTOMY    . CHOLECYSTECTOMY      Social History   Tobacco Use  . Smoking status: Current Every Day Smoker    Packs/day: 1.00    Years: 32.00    Pack years: 32.00    Types: Cigarettes  . Smokeless tobacco: Never Used  Substance Use Topics  . Alcohol use: No   Alcohol/week: 0.0 oz  . Drug use: No    Family History  Problem Relation Age of Onset  . Heart disease Mother   . Heart disease Sister   . Heart disease Sister   . Heart attack Sister   . Colon cancer Neg Hx   . Colon polyps Neg Hx   . Esophageal cancer Neg Hx   . Rectal cancer Neg Hx   . Stomach cancer Neg Hx     No Known Allergies  Medication list has been reviewed and updated.  Current Outpatient Medications on File Prior to Visit  Medication Sig Dispense Refill  . aspirin 81 MG tablet Take 81 mg by mouth daily.    . blood glucose meter kit and supplies KIT Dispense based on patient and insurance preference. Use up to four times daily as directed. (FOR ICD-10 E11.69). 1 each 0  . cetirizine (ZYRTEC) 10 MG tablet Take 1 tablet (10 mg total) by mouth daily. 30 tablet 11  . cyclobenzaprine (FLEXERIL) 5 MG tablet Take 1 tablet (5 mg total) by mouth 3 (three) times daily as needed for muscle spasms. 60 tablet 0  . fluticasone (FLONASE) 50 MCG/ACT nasal spray Use 2 sprays each nostril twice daily for 5 days,  then decrease to once daily 16 g 1  . Ipratropium-Albuterol (COMBIVENT) 20-100 MCG/ACT AERS respimat Inhale 1 puff into the lungs 4 (four) times daily. 4 g 0  . lidocaine (XYLOCAINE) 2 % solution Use as directed 5 mLs in the mouth or throat as needed for mouth pain. 100 mL 0  . losartan (COZAAR) 25 MG tablet TAKE 1 TABLET BY MOUTH ONCE DAILY 90 tablet 0  . meloxicam (MOBIC) 7.5 MG tablet Take 1-2 tablets (7.5-15 mg total) by mouth daily as needed for pain. 30 tablet 0  . metFORMIN (GLUCOPHAGE) 1000 MG tablet Take 1 tablet (1,000 mg total) by mouth 2 (two) times daily. 180 tablet 0  . nitrofurantoin, macrocrystal-monohydrate, (MACROBID) 100 MG capsule Take 1 capsule (100 mg total) 2 (two) times daily by mouth. 14 capsule 0  . Omega-3 Fatty Acids (FISH OIL) 1000 MG CAPS Take 2 capsules by mouth daily.    . phenazopyridine (PYRIDIUM) 100 MG tablet Take 1 tablet (100 mg total) 3  (three) times daily as needed by mouth for pain. 6 tablet 0  . rosuvastatin (CRESTOR) 40 MG tablet Take 1 tablet (40 mg total) by mouth daily. 90 tablet 3  . sitaGLIPtin (JANUVIA) 100 MG tablet Take 1 tablet (100 mg total) by mouth daily. Needs office visit for additional refills 30 tablet 0   No current facility-administered medications on file prior to visit.     ROS ROS otherwise unremarkable unless listed above.  Physical Examination: BP 128/84   Pulse 89   Temp 97.8 F (36.6 C) (Oral)   Resp 16   Ht '5\' 7"'  (1.702 m)   Wt 271 lb (122.9 kg)   SpO2 98%   BMI 42.44 kg/m  Ideal Body Weight: Weight in (lb) to have BMI = 25: 159.3  Physical Exam  Constitutional: She is oriented to person, place, and time. She appears well-developed and well-nourished. No distress.  HENT:  Head: Normocephalic and atraumatic.  Right Ear: External ear normal.  Left Ear: External ear normal.  Eyes: Pupils are equal, round, and reactive to light. Conjunctivae and EOM are normal.  Cardiovascular: Normal rate and regular rhythm. Exam reveals no friction rub.  No murmur heard. Pulmonary/Chest: Effort normal. No respiratory distress.  Abdominal: Soft. Normal appearance and bowel sounds are normal. There is no tenderness.  Neurological: She is alert and oriented to person, place, and time.  Skin: She is not diaphoretic.  Psychiatric: She has a normal mood and affect. Her behavior is normal.     Assessment and Plan: Molly Payne is a 57 y.o. female who is here today for cc of  Chief Complaint  Patient presents with  . Nausea    pt states the feeling comes and goes, at times she gets dizzy. x 1 wk. pt starting back taking [place] after some time and thinks thats why she may feel nausea  possible gastroenteritis, drug side effect, pancreatitis (however unlikely) Labs pending. Advised to start taking at half tablet of the januvia, then increase to full tablet.  zofran as needed. Nausea without  vomiting - Plan: CMP14+EGFR, POCT glucose (manual entry), POCT urinalysis dipstick, ondansetron (ZOFRAN-ODT) 8 MG disintegrating tablet, Lipase, HgB A1c  Ivar Drape, PA-C [facility] 4/24/20198:02 AM

## 2017-07-28 LAB — CMP14+EGFR
ALBUMIN: 4.2 g/dL (ref 3.5–5.5)
ALK PHOS: 109 IU/L (ref 39–117)
ALT: 31 IU/L (ref 0–32)
AST: 49 IU/L — ABNORMAL HIGH (ref 0–40)
Albumin/Globulin Ratio: 1.6 (ref 1.2–2.2)
BILIRUBIN TOTAL: 0.3 mg/dL (ref 0.0–1.2)
BUN / CREAT RATIO: 11 (ref 9–23)
BUN: 7 mg/dL (ref 6–24)
CHLORIDE: 100 mmol/L (ref 96–106)
CO2: 22 mmol/L (ref 20–29)
Calcium: 9.7 mg/dL (ref 8.7–10.2)
Creatinine, Ser: 0.62 mg/dL (ref 0.57–1.00)
GFR calc non Af Amer: 102 mL/min/{1.73_m2} (ref >59)
GFR, EST AFRICAN AMERICAN: 117 mL/min/{1.73_m2} (ref >59)
GLOBULIN, TOTAL: 2.6 g/dL (ref 1.5–4.5)
GLUCOSE: 220 mg/dL — AB (ref 65–99)
Potassium: 4.2 mmol/L (ref 3.5–5.2)
SODIUM: 135 mmol/L (ref 134–144)
TOTAL PROTEIN: 6.8 g/dL (ref 6.0–8.5)

## 2017-07-28 LAB — LIPASE: Lipase: 33 U/L (ref 14–72)

## 2017-07-29 ENCOUNTER — Telehealth: Payer: Self-pay | Admitting: Physician Assistant

## 2017-07-29 ENCOUNTER — Encounter: Payer: Self-pay | Admitting: Physician Assistant

## 2017-07-29 LAB — HEMOGLOBIN A1C
Est. average glucose Bld gHb Est-mCnc: 166 mg/dL
HEMOGLOBIN A1C: 7.4 % — AB (ref 4.8–5.6)

## 2017-07-29 NOTE — Telephone Encounter (Signed)
Thank you :)

## 2017-07-29 NOTE — Telephone Encounter (Signed)
Labs are added. JM

## 2017-07-29 NOTE — Telephone Encounter (Signed)
I ordered a hemoglobin a1c at her visit.  This was not collected apparently.  Cancelling and would like you to add on hemoglobin a1c. Dx: dm2 in obese

## 2017-07-29 NOTE — Addendum Note (Signed)
Addended by: Benson Setting L on: 07/29/2017 08:25 AM   Modules accepted: Orders

## 2017-08-03 ENCOUNTER — Ambulatory Visit (INDEPENDENT_AMBULATORY_CARE_PROVIDER_SITE_OTHER): Payer: 59 | Admitting: Physician Assistant

## 2017-08-03 ENCOUNTER — Encounter: Payer: Self-pay | Admitting: Physician Assistant

## 2017-08-03 ENCOUNTER — Other Ambulatory Visit: Payer: Self-pay

## 2017-08-03 VITALS — BP 134/74 | HR 96 | Resp 20 | Ht 66.85 in | Wt 273.4 lb

## 2017-08-03 DIAGNOSIS — Z1231 Encounter for screening mammogram for malignant neoplasm of breast: Secondary | ICD-10-CM | POA: Diagnosis not present

## 2017-08-03 DIAGNOSIS — F17200 Nicotine dependence, unspecified, uncomplicated: Secondary | ICD-10-CM

## 2017-08-03 DIAGNOSIS — J439 Emphysema, unspecified: Secondary | ICD-10-CM | POA: Diagnosis not present

## 2017-08-03 DIAGNOSIS — E119 Type 2 diabetes mellitus without complications: Secondary | ICD-10-CM

## 2017-08-03 DIAGNOSIS — Z1239 Encounter for other screening for malignant neoplasm of breast: Secondary | ICD-10-CM

## 2017-08-03 DIAGNOSIS — E785 Hyperlipidemia, unspecified: Secondary | ICD-10-CM | POA: Diagnosis not present

## 2017-08-03 MED ORDER — METFORMIN HCL 1000 MG PO TABS: 1000.0000 mg | ORAL_TABLET | Freq: Two times a day (BID) | ORAL | 0 refills | Status: DC

## 2017-08-03 MED ORDER — ROSUVASTATIN CALCIUM 40 MG PO TABS: 40.0000 mg | ORAL_TABLET | Freq: Every day | ORAL | 0 refills | Status: DC

## 2017-08-03 MED ORDER — LOSARTAN POTASSIUM 25 MG PO TABS: 25.0000 mg | ORAL_TABLET | Freq: Every day | ORAL | 0 refills | Status: DC

## 2017-08-03 MED ORDER — SITAGLIPTIN PHOSPHATE 100 MG PO TABS: 100.0000 mg | ORAL_TABLET | Freq: Every day | ORAL | 0 refills | Status: DC

## 2017-08-03 NOTE — Patient Instructions (Addendum)
I have given you refills for all of your medications.   pPease check them before you leave the pharmacy.  I recommend working on diet and exercise before your next visit.  I have also placed a referral for diabetic nutrition.  They should contact you within 2 weeks.  Follow-up in  to 3 months for reevaluation.  Diabetes Superfoods There are many foods rich in vitamins, minerals and fiber that you can make part of your daily eating plan that are good for your health. Some of these include non-starchy vegetables, fruits, legumes, nuts, seeds, and whole grains. "Superfood" is a term used by [employer] and beverage companies as a way to promote a food thought to have health benefits; however, there is no official definition of the word by the Food and Drug Administration (FDA). The FDA regulates the health claims allowed on food labels to ensure there is scientific research to support the claims. The list of foods below are rich in vitamins, minerals, antioxidants and fiber that are good for overall health and may also help prevent disease.  Beans Kidney, pinto, navy, or black beans are packed with vitamins and minerals such as magnesium and potassium. They are very high in fiber too. Beans do contain carbohydrates, but  cup also provides as much protein as an ounce of meat without the saturated fat. To save time you can use canned beans, but be sure to drain and rinse them to get rid of as much added salt as possible. Dark Green Leafy Vegetables Spinach, collards, and kale are dark green leafy vegetables packed with vitamins and minerals such as vitamins A, C, E, and K, iron, calcium and potassium. These powerhouse foods are low in calories and carbohydrates too. Try adding dark leafy vegetables to salads, soups and stews.  Citrus Fruit Grapefruits, oranges, lemons and limes or pick your favorites to get part of your daily dose of fiber, vitamin C, folate and potassium. Sweet Potatoes A starchy  vegetable packed full of vitamin A and fiber. They are also a good source of vitamin C and potassium.  Craving something sweet? Try a sweet potato in place of a regular potato and sprinkle cinnamon on top. Berries Which are your favorites: blueberries, strawberries or another variety? Regardless, they are all packed with antioxidants, vitamins and fiber. Berries can be a great option to satisfy your sweet tooth and they provide an added benefit of vitamin C, vitamin K, manganese, potassium and fiber.  Tomatoes The good news is that no matter how you like your tomatoes, pureed, raw, or in a sauce, you're eating vital nutrients like vitamin C, vitamin E and potassium. Fish High in Omega-3 Fatty Acids Omega-3 fats may help to reduce the risk of heart disease and inflammation. Fish high in these healthy fats are sometimes referred to as "fatty fish." Molly Payne is well known in this group. Other fish high in omega-3 are herring, sardines, mackerel, trout, and albacore tuna. Choose fish that is broiled, baked or grilled to avoid the carbohydrate and extra calories that would be in fish that is breaded and fried. The American Diabetes Association Standards of Medical Care in Diabetes 2017 recommends eating fish (mainly fatty fish) twice per week for people with diabetes.  Nuts An ounce of nuts can go a long way in getting key healthy fats along with helping to manage hunger. In addition, they offer magnesium and fiber. Some nuts and seeds, such as walnuts and flax seeds, are a good source of omega-3  fatty acids. Whole Grains It's the whole grain you're after. The first ingredient on the label should have the word "whole" in it. Whole grains are rich in vitamins and minerals like magnesium, B vitamins, chromium, iron and folate. They are a great source of fiber too. Some examples of whole grains are whole oats, quinoa, whole grain barley and farro.  Milk and Yogurt You may have heard that milk and yogurt can help  build strong bones and teeth. In addition to calcium, many milk and yogurt products are a fortified to make them a good source of vitamin D. More research is emerging on the connection between vitamin D and good health. Milk and yogurt do contain carbohydrate that will be a factor in meal planning when you have diabetes. Look for yogurt products that are lower in fat and added sugar. Tips for Eating on a Budget Some of the items above can be tough on the budget depending on the season and where you live. Look for lower cost options such as fruit and vegetables in season or frozen or canned fish. Foods that are easier on the budget year 'round are beans and whole grains that you cook from scratch. Learn more about how to include these and other healthful foods into your meals. The American Diabetes Association's book What Do I Eat Now? provides a step-by-step guide to eating right.    Fruits Wondering if you can eat fruit? Yes! Fruits are loaded with vitamins, minerals and fiber just like vegetables. Fruit contains carbohydrate so you need to count it as part of your meal plan. Having a piece of fresh fruit or fruit salad for dessert is a great way to satisfy your sweet tooth and get the extra nutrition you're looking for. What are the Best Choices? The best choices of fruit are any that are fresh, frozen or canned without added sugars. Choose canned fruits in juice or light syrup  Dried fruit and 100% fruit juice are also nutritious choices, but the portion sizes are small so they may not be as filling as other choices. Tips For Carbohydrate Counters A small piece of whole fruit or about  cup of frozen or canned fruit has about 15 grams of carbohydrate. Servings for most fresh berries and melons are from  - 1 cup. Fruit juice can range from 1/3 -1/2 cup for 15 grams of carbohydrate. Only 2 tablespoons of dried fruit like raisins or dried cherries contains 15 grams of carbohydrate so be cautious  with your portion sizes! Fruit can be eaten in exchange for other sources of carbohydrate in your meal plan such as starches, grains, or dairy. For Plate Method If using the plate method, having a small piece of whole fruit or a  cup of fruit salad for dessert is a great complement to the non-starchy vegetables, small portion of starch and protein foods that are on your plate. For Using the Glycemic Index Most fruits have a low glycemic index (GI) because of their fructose and fiber content. Melons and pineapple have medium GI values as do some dried fruits such as dates, raisins, and sweetened cranberries. Overall, fruit is encouraged when using the glycemic index to guide food choices-so enjoy. Common Fruits The following is a list of common fruits: Apples  Applesauce  Apricots  Avocado  Banana  Blackberries  Blueberries  Cantaloupe  Cherries  Dried fruit such as:  Cherries  Cranberries  Dates  Figs  Prunes  Raisins Fruit cocktail  Grapefruit  Grapes  Honeydew melon  Kiwi  Mango  Nectarine  Orange  Papaya  Peaches  Pears  Pineapple  Plums  Raspberries  Strawberries  Tangerines  Watermelon    Diabetes Mellitus and Nutrition When you have diabetes (diabetes mellitus), it is very important to have healthy eating habits because your blood sugar (glucose) levels are greatly affected by what you eat and drink. Eating healthy foods in the appropriate amounts, at about the same times every day, can help you:  Control your blood glucose.  Lower your risk of heart disease.  Improve your blood pressure.  Reach or maintain a healthy weight.  Every person with diabetes is different, and each person has different needs for a meal plan. Your health care provider may recommend that you work with a diet and nutrition specialist (dietitian) to make a meal plan that is best for you. Your meal plan may vary depending on factors such as:  The calories you need.  The  medicines you take.  Your weight.  Your blood glucose, blood pressure, and cholesterol levels.  Your activity level.  Other health conditions you have, such as heart or kidney disease.  How do carbohydrates affect me? Carbohydrates affect your blood glucose level more than any other type of food. Eating carbohydrates naturally increases the amount of glucose in your blood. Carbohydrate counting is a method for keeping track of how many carbohydrates you eat. Counting carbohydrates is important to keep your blood glucose at a healthy level, especially if you use insulin or take certain oral diabetes medicines. It is important to know how many carbohydrates you can safely have in each meal. This is different for every person. Your dietitian can help you calculate how many carbohydrates you should have at each meal and for snack. Foods that contain carbohydrates include:  Bread, cereal, rice, pasta, and crackers.  Potatoes and corn.  Peas, beans, and lentils.  Milk and yogurt.  Fruit and juice.  Desserts, such as cakes, cookies, ice cream, and candy.  How does alcohol affect me? Alcohol can cause a sudden decrease in blood glucose (hypoglycemia), especially if you use insulin or take certain oral diabetes medicines. Hypoglycemia can be a life-threatening condition. Symptoms of hypoglycemia (sleepiness, dizziness, and confusion) are similar to symptoms of having too much alcohol. If your health care provider says that alcohol is safe for you, follow these guidelines:  Limit alcohol intake to no more than 1 drink per day for nonpregnant women and 2 drinks per day for men. One drink equals 12 oz of beer, 5 oz of wine, or 1 oz of hard liquor.  Do not drink on an empty stomach.  Keep yourself hydrated with water, diet soda, or unsweetened iced tea.  Keep in mind that regular soda, juice, and other mixers may contain a lot of sugar and must be counted as carbohydrates.  What are tips  for following this plan? Reading food labels  Start by checking the serving size on the label. The amount of calories, carbohydrates, fats, and other nutrients listed on the label are based on one serving of the food. Many foods contain more than one serving per package.  Check the total grams (g) of carbohydrates in one serving. You can calculate the number of servings of carbohydrates in one serving by dividing the total carbohydrates by 15. For example, if a food has 30 g of total carbohydrates, it would be equal to 2 servings of carbohydrates.  Check the number of grams (  g) of saturated and trans fats in one serving. Choose foods that have low or no amount of these fats.  Check the number of milligrams (mg) of sodium in one serving. Most people should limit total sodium intake to less than 2,300 mg per day.  Always check the nutrition information of foods labeled as "low-fat" or "nonfat". These foods may be higher in added sugar or refined carbohydrates and should be avoided.  Talk to your dietitian to identify your daily goals for nutrients listed on the label. Shopping  Avoid buying canned, premade, or processed foods. These foods tend to be high in fat, sodium, and added sugar.  Shop around the outside edge of the grocery store. This includes fresh fruits and vegetables, bulk grains, fresh meats, and fresh dairy. Cooking  Use low-heat cooking methods, such as baking, instead of high-heat cooking methods like deep frying.  Cook using healthy oils, such as olive, canola, or sunflower oil.  Avoid cooking with butter, cream, or high-fat meats. Meal planning  Eat meals and snacks regularly, preferably at the same times every day. Avoid going long periods of time without eating.  Eat foods high in fiber, such as fresh fruits, vegetables, beans, and whole grains. Talk to your dietitian about how many servings of carbohydrates you can eat at each meal.  Eat 4-6 ounces of lean protein  each day, such as lean meat, chicken, fish, eggs, or tofu. 1 ounce is equal to 1 ounce of meat, chicken, or fish, 1 egg, or 1/4 cup of tofu.  Eat some foods each day that contain healthy fats, such as avocado, nuts, seeds, and fish. Lifestyle   Check your blood glucose regularly.  Exercise at least 30 minutes 5 or more days each week, or as told by your health care provider.  Take medicines as told by your health care provider.  Do not use any products that contain nicotine or tobacco, such as cigarettes and e-cigarettes. If you need help quitting, ask your health care provider.  Work with a Social worker or diabetes educator to identify strategies to manage stress and any emotional and social challenges. What are some questions to ask my health care provider?  Do I need to meet with a diabetes educator?  Do I need to meet with a dietitian?  What number can I call if I have questions?  When are the best times to check my blood glucose? Where to find more information:  American Diabetes Association: diabetes.org/food-and-fitness/food  Academy of Nutrition and Dietetics: PokerClues.dk  [employer] of Diabetes and Digestive and Kidney Diseases (NIH): ContactWire.be Summary  A healthy meal plan will help you control your blood glucose and maintain a healthy lifestyle.  Working with a diet and nutrition specialist (dietitian) can help you make a meal plan that is best for you.  Keep in mind that carbohydrates and alcohol have immediate effects on your blood glucose levels. It is important to count carbohydrates and to use alcohol carefully. This information is not intended to replace advice given to you by your health care provider. Make sure you discuss any questions you have with your health care provider. Document Released: 12/19/2004 Document Revised:  04/28/2016 Document Reviewed: 04/28/2016 Elsevier Interactive Patient Education  2018 [employer].   IF you received an x-ray today, you will receive an invoice from [facility]. Please contact [facility] at [phone] with questions or concerns regarding your invoice.   IF you received labwork today, you will receive an invoice from [employer] [facility].  Please contact [facility] at [phone] with questions or concerns regarding your invoice.   Our billing staff will not be able to assist you with questions regarding bills from these companies.  You will be contacted with the lab results as soon as they are available. The fastest way to get your results is to activate your My Chart account. Instructions are located on the last page of this paperwork. If you have not heard from [place] regarding the results in 2 weeks, please contact this office.    We recommend that you schedule a mammogram for breast cancer screening. Typically, you do not need a referral to do this. Please contact a local imaging center to schedule your mammogram.  [facility] - [phone]  *ask for the Radiology Department [facility] [place] ([place]) - [phone] or [phone]  [facility] - [phone] [place] [phone] [facility] - [phone]  *ask for the [facility] - [phone]  *ask for the Radiology Department [facility] - [phone]  *ask for the [place] - [phone]

## 2017-08-03 NOTE — Progress Notes (Signed)
MRN: [id number]  Subjective:   Molly Payne is a 57 y.o. female who presents for follow up of Type 2 diabetes mellitus. A1C drawn at 07/27/17 visit with PA English. Lab elevated at 7.4, which is up from 7.1 value 5 months ago. T2DM diagnosis was made in 2015. Patient is currently managed with metformin 1000 mg twice and Januvia 100 mg daily.  Admits good compliance. Denies adverse effects including metallic taste, hypoglycemia, nausea, vomiting.   Patient is not checking home blood sugars. Current symptoms include none. Patient denies foot ulcerations, nausea, paresthesia of the feet, polydipsia, polyuria, visual disturbances, vomiting and weight loss. Patient is checking their feet daily. No foot concerns. Last diabetic eye exam eye exam 12/2016, no diabetic retinopathy.  Diet has worsened since last follow up in 02/2017. Notes she likes food too much and is eating more. will have oatmeal for breakfast, fast food or sandwich for lunch, and something quick for dinner like [place], [place], likes to eat out about twice a week. The other other times she is cooking at home.. Drinks only water. No structured exercise. .  She is currently smoking 1 ppd. Has emphysema but cannot remember if she has ever had PFTs. Does not take any inhaler daily.  Has chronic cough. Denies SOB, wheezing, chest tightness, and DOE. Drinks alcohol occasionally.  ROS Per HPI     Social History   Socioeconomic History  . Marital status: Married    Spouse name: Not on file  . Number of children: 2  . Years of education: 9  . Highest education level: Not on file  Occupational History  . Occupation: Therapist, art  Social Needs  . Financial resource strain: Not on file  . Food insecurity:    Worry: Not on file    Inability: Not on file  . Transportation needs:    Medical: Not on file    Non-medical: Not on file  Tobacco Use  . Smoking status: Current Every Day Smoker    Packs/day: 1.00    Years: 32.00     Pack years: 32.00    Types: Cigarettes  . Smokeless tobacco: Never Used  Substance and Sexual Activity  . Alcohol use: No    Alcohol/week: 0.0 oz  . Drug use: No  . Sexual activity: Never  Lifestyle  . Physical activity:    Days per week: Not on file    Minutes per session: Not on file  . Stress: Not on file  Relationships  . Social connections:    Talks on phone: Not on file    Gets together: Not on file    Attends religious service: Not on file    Active member of club or organization: Not on file    Attends meetings of clubs or organizations: Not on file    Relationship status: Not on file  . Intimate partner violence:    Fear of current or ex partner: Not on file    Emotionally abused: Not on file    Physically abused: Not on file    Forced sexual activity: Not on file  Other Topics Concern  . Not on file  Social History Narrative   epworth sleepiness scale = 3 (09/20/15)   Past Surgical History:  Procedure Laterality Date  . ABDOMINAL HYSTERECTOMY    . APPENDECTOMY    . CHOLECYSTECTOMY      Objective:   PHYSICAL EXAM BP 134/74 (BP Location: Left Arm, Patient Position: Sitting, Cuff Size: Large)  Pulse 96   Resp 20   Ht 5' 6.85" (1.698 m)   Wt 273 lb 6.4 oz (124 kg)   SpO2 96%   BMI 43.01 kg/m   Physical Exam  Constitutional: She is oriented to person, place, and time. She appears well-developed and well-nourished. No distress.  HENT:  Head: Normocephalic and atraumatic.  Mouth/Throat: Uvula is midline, oropharynx is clear and moist and mucous membranes are normal. Abnormal dentition. No tonsillar exudate.  Eyes: Pupils are equal, round, and reactive to light. Conjunctivae and EOM are normal.  Neck: Normal range of motion.  Cardiovascular: Normal rate, regular rhythm, normal heart sounds and intact distal pulses.  Pulmonary/Chest: Effort normal. She has no wheezes. She has no rhonchi.  Coarse breath sounds bilaterally.  Abdominal: Soft. Normal  appearance and bowel sounds are normal. There is no tenderness.  Neurological: She is alert and oriented to person, place, and time.  Skin: Skin is warm and dry.  Varicose veins and telangectasia noted on lower extremities bilaterally.   Psychiatric: She has a normal mood and affect.  Vitals reviewed.   No results found for this or any previous visit (from the past 24 hour(s)).   Wt Readings from Last 3 Encounters:  08/03/17 273 lb 6.4 oz (124 kg)  07/27/17 271 lb (122.9 kg)  06/19/17 271 lb (122.9 kg)    Assessment and Plan :  1. Type 2 diabetes mellitus without complication, without long-term current use of insulin ([place]) Lab results from previous 4/22 OV reviewed with pt. A1C increasing. Discussed tx options with patient. She is motivated to work on lifestyle modifications over the next 3 months instead of adding a third medication. Had long discussion about diet and exercise. Given educational material on healthy dietary options. Also think she would benefit for diabetic nutrition, she agrees to give this a try. Will continue with current medication regimen. Plan to follow up in 3 months. If A1C still >7.0, will add 3rd medication at that time.  - Ambulatory referral to diabetic education - metFORMIN (GLUCOPHAGE) 1000 MG tablet; Take 1 tablet (1,000 mg total) by mouth 2 (two) times daily.  Dispense: 180 tablet; Refill: 0 - sitaGLIPtin (JANUVIA) 100 MG tablet; Take 1 tablet (100 mg total) by mouth daily.  Dispense: 90 tablet; Refill: 0 - rosuvastatin (CRESTOR) 40 MG tablet; Take 1 tablet (40 mg total) by mouth daily.  Dispense: 90 tablet; Refill: 0  2. Dyslipidemia -Will obtain lipid panel at f/u appointment  - rosuvastatin (CRESTOR) 40 MG tablet; Take 1 tablet (40 mg total) by mouth daily.  Dispense: 90 tablet; Refill: 0  3. Morbid obesity ([place]) Discussed lifestyle modifications.   4. Smoker 5. Pulmonary emphysema, unspecified emphysema type ([place]) Suspect COPD.Recommend PFTs. Pt  DECLINES today but notes she may reconsider this at next visit.   6. Screening for breast cancer Patient given contact information to schedule mammogram.  [name], PA-C  [facility] [place] 08/03/2017 4:38 PM

## 2017-08-07 ENCOUNTER — Encounter: Payer: Self-pay | Admitting: Physician Assistant

## 2017-08-14 ENCOUNTER — Other Ambulatory Visit: Payer: Self-pay

## 2017-08-14 ENCOUNTER — Encounter: Payer: Self-pay | Admitting: Physician Assistant

## 2017-08-14 ENCOUNTER — Ambulatory Visit (INDEPENDENT_AMBULATORY_CARE_PROVIDER_SITE_OTHER): Payer: 59 | Admitting: Physician Assistant

## 2017-08-14 VITALS — BP 128/70 | HR 101 | Temp 98.7°F | Resp 16 | Ht 66.85 in | Wt 272.8 lb

## 2017-08-14 DIAGNOSIS — R350 Frequency of micturition: Secondary | ICD-10-CM

## 2017-08-14 DIAGNOSIS — R8290 Unspecified abnormal findings in urine: Secondary | ICD-10-CM | POA: Diagnosis not present

## 2017-08-14 LAB — POCT URINALYSIS DIP (MANUAL ENTRY)
Bilirubin, UA: NEGATIVE
Glucose, UA: NEGATIVE mg/dL
Nitrite, UA: NEGATIVE
PH UA: 7 (ref 5.0–8.0)
Spec Grav, UA: 1.02 (ref 1.010–1.025)
UROBILINOGEN UA: 4 U/dL — AB

## 2017-08-14 MED ORDER — NITROFURANTOIN MONOHYD MACRO 100 MG PO CAPS: 100.0000 mg | ORAL_CAPSULE | Freq: Two times a day (BID) | ORAL | 0 refills | Status: AC

## 2017-08-14 NOTE — Patient Instructions (Addendum)
     IF you received an x-ray today, you will receive an invoice from [facility]. Please contact [facility] at [phone] with questions or concerns regarding your invoice.   IF you received labwork today, you will receive an invoice from [facility]. Please contact [facility] at [phone] with questions or concerns regarding your invoice.   Our billing staff will not be able to assist you with questions regarding bills from these companies.  You will be contacted with the lab results as soon as they are available. The fastest way to get your results is to activate your My Chart account. Instructions are located on the last page of this paperwork. If you have not heard from us regarding the results in 2 weeks, please contact this office.     Urinary Tract Infection, Adult A urinary tract infection (UTI) is an infection of any part of the urinary tract. The urinary tract includes the:  Kidneys.  Ureters.  Bladder.  Urethra.  These organs make, store, and get rid of pee (urine) in the body. Follow these instructions at home:  Take over-the-counter and prescription medicines only as told by your doctor.  If you were prescribed an antibiotic medicine, take it as told by your doctor. Do not stop taking the antibiotic even if you start to feel better.  Avoid the following drinks: ? Alcohol. ? Caffeine. ? Tea. ? Carbonated drinks.  Drink enough fluid to keep your pee clear or pale yellow.  Keep all follow-up visits as told by your doctor. This is important.  Make sure to: ? Empty your bladder often and completely. Do not to hold pee for long periods of time. ? Empty your bladder before and after sex. ? Wipe from front to back after a bowel movement if you are female. Use each tissue one time when you wipe. Contact a doctor if:  You have back pain.  You have a fever.  You feel sick to your stomach (nauseous).  You throw up (vomit).  Your symptoms do  not get better after 3 days.  Your symptoms go away and then come back. Get help right away if:  You have very bad back pain.  You have very bad lower belly (abdominal) pain.  You are throwing up and cannot keep down any medicines or water. This information is not intended to replace advice given to you by your health care provider. Make sure you discuss any questions you have with your health care provider. Document Released: 09/10/2007 Document Revised: 08/30/2015 Document Reviewed: 02/12/2015 Elsevier Interactive Patient Education  2018 Elsevier Inc.  

## 2017-08-14 NOTE — Progress Notes (Signed)
Patient ID: Molly Payne, female    DOB: 1961/01/01, 57 y.o.   MRN: [id number]  PCP: Leonie Douglas, PA-C  Chief Complaint  Patient presents with  . Urinary Tract Infection    urgency to void, painful urination x 3 days     Subjective:   Presents for evaluation of possible UTI.  She reports 3 days of increased urinary urgency and frequency as well as pain with urination.  No atypical vaginal discharge. Monogamous sex with her husband.  No fever, chills. No nausea, vomiting, diarrhea. No back pain  Diabetes not well controlled at her visit last month. Has not checked home glucose since these symptoms began.   Review of Systems As above.    Patient Active Problem List   Diagnosis Date Noted  . Agatston coronary artery calcium score less than 100 11/05/2015  . Cough due to ACE inhibitor 11/05/2015  . Dyslipidemia 09/20/2015  . Family history of heart disease 09/20/2015  . Smoker 09/20/2015  . Diabetes mellitus type 2 in obese ([place]) 08/09/2014  . Morbid obesity ([place]) 08/09/2014     Prior to Admission medications   Medication Sig Start Date End Date Taking? Authorizing Provider  aspirin 81 MG tablet Take 81 mg by mouth daily.   Yes [provider]  blood glucose meter kit and supplies KIT Dispense based on patient and insurance preference. Use up to four times daily as directed. (FOR ICD-10 E11.69). 05/10/16  Yes Shawnee Knapp, MD  cetirizine (ZYRTEC) 10 MG tablet Take 1 tablet (10 mg total) by mouth daily. 12/03/16  Yes Timmothy Euler, [place] D, PA-C  cyclobenzaprine (FLEXERIL) 5 MG tablet Take 1 tablet (5 mg total) by mouth 3 (three) times daily as needed for muscle spasms. 06/19/17  Yes Timmothy Euler, [place] D, PA-C  fluticasone (FLONASE) 50 MCG/ACT nasal spray Use 2 sprays each nostril twice daily for 5 days, then decrease to once daily 04/21/17  Yes Posey Boyer, MD  Ipratropium-Albuterol (COMBIVENT) 20-100 MCG/ACT AERS respimat Inhale 1 puff into the lungs  4 (four) times daily. 05/10/16  Yes Shawnee Knapp, MD  lidocaine (XYLOCAINE) 2 % solution Use as directed 5 mLs in the mouth or throat as needed for mouth pain. 12/17/16  Yes Tereasa Coop, PA-C  losartan (COZAAR) 25 MG tablet Take 1 tablet (25 mg total) by mouth daily. 08/03/17  Yes Timmothy Euler, [place] D, PA-C  meloxicam (MOBIC) 7.5 MG tablet Take 1-2 tablets (7.5-15 mg total) by mouth daily as needed for pain. 06/19/17  Yes Timmothy Euler, [place] D, PA-C  metFORMIN (GLUCOPHAGE) 1000 MG tablet Take 1 tablet (1,000 mg total) by mouth 2 (two) times daily. 08/03/17  Yes Timmothy Euler, [place] D, PA-C  Omega-3 Fatty Acids (FISH OIL) 1000 MG CAPS Take 2 capsules by mouth daily.   Yes [provider]  ondansetron (ZOFRAN-ODT) 8 MG disintegrating tablet Take 1 tablet (8 mg total) by mouth every 8 (eight) hours as needed. 07/27/17  Yes English, Colletta [place] D, PA  rosuvastatin (CRESTOR) 40 MG tablet Take 1 tablet (40 mg total) by mouth daily. 08/03/17  Yes Timmothy Euler, [place] D, PA-C  sitaGLIPtin (JANUVIA) 100 MG tablet Take 1 tablet (100 mg total) by mouth daily. 08/03/17  Yes Timmothy Euler, [place] D, PA-C     No Known Allergies     Objective:  Physical Exam  Constitutional: She is oriented to person, place, and time. Vital signs are normal. She appears well-developed and well-nourished. No distress.  HENT:  Head: Normocephalic and  atraumatic.  Cardiovascular: Normal rate, regular rhythm and normal heart sounds.  Pulmonary/Chest: Effort normal and breath sounds normal.  Abdominal: Soft. Normal appearance and bowel sounds are normal. She exhibits no distension and no mass. There is no hepatosplenomegaly. There is no tenderness. There is no rigidity, no rebound, no guarding, no CVA tenderness, no tenderness at McBurney's point and negative Murphy's sign. No hernia.  Musculoskeletal: Normal range of motion.       Lumbar back: Normal.  Neurological: She is alert and oriented to person, place, and time.  Skin: Skin is  warm and dry. No rash noted. She is not diaphoretic. No pallor.  Psychiatric: She has a normal mood and affect. Her speech is normal and behavior is normal. Judgment normal.       Results for orders placed or performed in visit on 08/14/17  POCT urinalysis dipstick  Result Value Ref Range   Color, UA yellow yellow   Clarity, UA cloudy (A) clear   Glucose, UA negative negative mg/dL   Bilirubin, UA negative negative   Ketones, POC UA trace (5) (A) negative mg/dL   Spec Grav, UA 1.020 1.010 - 1.025   Blood, UA small (A) negative   pH, UA 7.0 5.0 - 8.0   Protein Ur, POC =30 (A) negative mg/dL   Urobilinogen, UA 4.0 (A) 0.2 or 1.0 E.U./dL   Nitrite, UA Negative Negative   Leukocytes, UA Large (3+) (A) Negative       Assessment & Plan:   1. Urinary frequency - POCT urinalysis dipstick  2. Abnormal urine finding Treat empirically for presumed urinary tract infection.  Anticipatory guidance provided.  Supportive care. - Urine Culture - nitrofurantoin, macrocrystal-monohydrate, (MACROBID) 100 MG capsule; Take 1 capsule (100 mg total) by mouth 2 (two) times daily for 7 days.  Dispense: 14 capsule; Refill: 0    Return if symptoms worsen or fail to improve.   Fara Chute, PA-C [facility]

## 2017-08-17 LAB — URINE CULTURE

## 2017-09-01 ENCOUNTER — Telehealth: Payer: Self-pay | Admitting: Physician Assistant

## 2017-09-01 NOTE — Telephone Encounter (Signed)
Molly Payne,  Someone from [facility] called and stated that we needed to get auth for pt ct adb and pelvis w/wo they had scheduled pt to have it done on 09/04/17 and I tried to get auth then I seen within the referral someone had put do not schedule in the status so I called and canceled the order at [facility], I am asking did you want to still have this done? It was put in back in nov 2018. Just let me know if you still want this procedure done for the pt. Thanks.

## 2017-09-01 NOTE — Telephone Encounter (Signed)
Yes I would still like to have this done. Thank you!

## 2017-09-01 NOTE — Telephone Encounter (Signed)
Okay! It was denied through pt insurance on 04/22/17 and 09/01/17 a  peer to peer can be done in order for pt to have ct done. The case order num is [id number] pt dob is 03/19/1961! Pt member I.d. Num is [id number].  I sent pt ov notes and labs to the clinical insurance line hopefully will hear something back soon about a decision

## 2017-09-03 NOTE — Telephone Encounter (Signed)
I received approval through pt insurance for pt to have a CT done. Auth# [id number] valid from 09/02/17-12/01/2017

## 2017-09-04 ENCOUNTER — Other Ambulatory Visit: Payer: 59

## 2017-09-15 ENCOUNTER — Ambulatory Visit
Admission: RE | Admit: 2017-09-15 | Discharge: 2017-09-15 | Disposition: A | Discharge status: Home / Self Care | Payer: 59 | Source: Ambulatory Visit | Attending: Physician Assistant | Admitting: Physician Assistant

## 2017-09-15 DIAGNOSIS — R9389 Abnormal findings on diagnostic imaging of other specified body structures: Secondary | ICD-10-CM

## 2017-09-15 MED ORDER — IOPAMIDOL (ISOVUE-300) INJECTION 61%
125.0000 mL | Freq: Once | INTRAVENOUS | Status: AC | PRN
  Administered 2017-09-15: 125 mL via INTRAVENOUS

## 2017-09-28 ENCOUNTER — Other Ambulatory Visit: Payer: Self-pay

## 2017-09-28 ENCOUNTER — Encounter: Payer: Self-pay | Admitting: Physician Assistant

## 2017-09-28 ENCOUNTER — Ambulatory Visit (INDEPENDENT_AMBULATORY_CARE_PROVIDER_SITE_OTHER): Payer: 59 | Admitting: Physician Assistant

## 2017-09-28 VITALS — BP 135/80 | HR 88 | Temp 97.7°F | Resp 20 | Ht 67.17 in | Wt 272.0 lb

## 2017-09-28 DIAGNOSIS — R350 Frequency of micturition: Secondary | ICD-10-CM | POA: Diagnosis not present

## 2017-09-28 DIAGNOSIS — R82998 Other abnormal findings in urine: Secondary | ICD-10-CM | POA: Diagnosis not present

## 2017-09-28 LAB — POCT URINALYSIS DIP (MANUAL ENTRY)
Bilirubin, UA: NEGATIVE
GLUCOSE UA: NEGATIVE mg/dL
Ketones, POC UA: NEGATIVE mg/dL
Nitrite, UA: POSITIVE — AB
Protein Ur, POC: NEGATIVE mg/dL
SPEC GRAV UA: 1.015 (ref 1.010–1.025)
UROBILINOGEN UA: 0.2 U/dL
pH, UA: 6 (ref 5.0–8.0)

## 2017-09-28 LAB — POC MICROSCOPIC URINALYSIS (UMFC): MUCUS RE: ABSENT

## 2017-09-28 MED ORDER — CEPHALEXIN 500 MG PO CAPS: 500.0000 mg | ORAL_CAPSULE | Freq: Two times a day (BID) | ORAL | 0 refills | Status: AC

## 2017-09-28 NOTE — Patient Instructions (Addendum)
  Your results indicate you have a UTI. I have given you a prescription for an antibiotic. Please take with food. I have sent off a urine culture and we should have those results in 48 hours. If your symptoms worsen while you are awaiting these results or you develop fever, chills, flank pian, nausea and vomiting, please seek care immediately.    Urinary Tract Infection, Adult A urinary tract infection (UTI) is an infection of any part of the urinary tract. The urinary tract includes the:  Kidneys.  Ureters.  Bladder.  Urethra.  These organs make, store, and get rid of pee (urine) in the body. Follow these instructions at home:  Take over-the-counter and prescription medicines only as told by your doctor.  If you were prescribed an antibiotic medicine, take it as told by your doctor. Do not stop taking the antibiotic even if you start to feel better.  Avoid the following drinks: ? Alcohol. ? Caffeine. ? Tea. ? Carbonated drinks.  Drink enough fluid to keep your pee clear or pale yellow.  Keep all follow-up visits as told by your doctor. This is important.  Make sure to: ? Empty your bladder often and completely. Do not to hold pee for long periods of time. ? Empty your bladder before and after sex. ? Wipe from front to back after a bowel movement if you are female. Use each tissue one time when you wipe. Contact a doctor if:  You have back pain.  You have a fever.  You feel sick to your stomach (nauseous).  You throw up (vomit).  Your symptoms do not get better after 3 days.  Your symptoms go away and then come back. Get help right away if:  You have very bad back pain.  You have very bad lower belly (abdominal) pain.  You are throwing up and cannot keep down any medicines or water. This information is not intended to replace advice given to you by your health care provider. Make sure you discuss any questions you have with your health care provider. Document  Released: 09/10/2007 Document Revised: 08/30/2015 Document Reviewed: 02/12/2015 Elsevier Interactive Patient Education  2018 Elsevier Inc.    IF you received an x-ray today, you will receive an invoice from [facility]. Please contact [facility] at [phone] with questions or concerns regarding your invoice.   IF you received labwork today, you will receive an invoice from [facility]. Please contact [facility] at [phone] with questions or concerns regarding your invoice.   Our billing staff will not be able to assist you with questions regarding bills from these companies.  You will be contacted with the lab results as soon as they are available. The fastest way to get your results is to activate your My Chart account. Instructions are located on the last page of this paperwork. If you have not heard from us regarding the results in 2 weeks, please contact this office.     

## 2017-09-28 NOTE — Progress Notes (Signed)
09/28/2017 at [place] / DOB: [birth year] / MRN: [id number]  The patient has Diabetes mellitus type 2 in obese ([place]); Morbid obesity ([place]); Dyslipidemia; Family history of heart disease; Smoker; Agatston coronary artery calcium score less than 100; and Cough due to ACE inhibitor on their problem list.  SUBJECTIVE  Molly Payne is a 57 y.o. female who complains of urinary frequency and urinary urgency x 4 days. She denies dysuria, hematuria, flank pain, abdominal pain, pelvic pain and vaginal discharge. Has not tried anything for relief. Most recent UTI prior to this was 08/2017, tx with macrobid, urine culture positive for Klebsiella which was indeterminate to [place].  Notes her symptoms went away fully but then returned.  She  has a past medical history of Allergy, Diabetes mellitus without complication ([place]), and Hyperlipidemia.    Medications reviewed and updated by myself where necessary, and exist elsewhere in the encounter.   Molly Payne has No Known Allergies. She  reports that she has been smoking cigarettes.  She has a 32.00 pack-year smoking history. She has never used smokeless tobacco. She reports that she does not drink alcohol or use drugs. She  reports that she currently engages in sexual activity. The patient  has a past surgical history that includes Appendectomy; Cholecystectomy; and Abdominal hysterectomy.  Her family history includes Heart attack in her sister; Heart disease in her mother, sister, and sister.  Review of Systems  Constitutional: Negative for chills, diaphoresis and fever.  Gastrointestinal: Negative for nausea and vomiting.    OBJECTIVE  Her  height is 5' 7.17" (1.706 m) and weight is 272 lb (123.4 kg). Her oral temperature is 97.7 F (36.5 C). Her blood pressure is 135/80 and her pulse is 88. Her respiration is 20 and oxygen saturation is 95%.  The patient's body mass index is 42.39 kg/m.  Physical Exam  Constitutional: She is  oriented to person, place, and time. She appears well-developed and well-nourished. No distress.  HENT:  Head: Normocephalic and atraumatic.  Eyes: Conjunctivae are normal.  Neck: Normal range of motion.  Pulmonary/Chest: Effort normal.  Abdominal: Soft. Normal appearance. There is no tenderness. There is no CVA tenderness.  Neurological: She is alert and oriented to person, place, and time.  Skin: Skin is warm and dry.  Psychiatric: She has a normal mood and affect.  Vitals reviewed.   Results for orders placed or performed in visit on 09/28/17 (from the past 24 hour(s))  POCT urinalysis dipstick     Status: Abnormal   Collection Time: 09/28/17  8:52 AM  Result Value Ref Range   Color, UA yellow yellow   Clarity, UA cloudy (A) clear   Glucose, UA negative negative mg/dL   Bilirubin, UA negative negative   Ketones, POC UA negative negative mg/dL   Spec Grav, UA 1.015 1.010 - 1.025   Blood, UA small (A) negative   pH, UA 6.0 5.0 - 8.0   Protein Ur, POC negative negative mg/dL   Urobilinogen, UA 0.2 0.2 or 1.0 E.U./dL   Nitrite, UA Positive (A) Negative   Leukocytes, UA Large (3+) (A) Negative  POCT Microscopic Urinalysis ([facility])     Status: Abnormal   Collection Time: 09/28/17  9:05 AM  Result Value Ref Range   WBC,UR,HPF,POC Too numerous to count  (A) None WBC/hpf   RBC,UR,HPF,POC Few (A) None RBC/hpf   Bacteria None None, Too numerous to count   Mucus Absent Absent   Epithelial Cells, UR  Per Microscopy Moderate (A) None, Too numerous to count cells/hpf    ASSESSMENT & PLAN  Molly Payne was seen today for urinary frequency.  Diagnoses and all orders for this visit:  Urinary frequency -     POCT urinalysis dipstick -     POCT Microscopic Urinalysis ([facility])  Leukocytes in urine -     Urine Culture -     cephALEXin (KEFLEX) 500 MG capsule; Take 1 capsule (500 mg total) by mouth 2 (two) times daily for 7 days.    Hx, UA, and urine micro suggestive of UTI.  Suspect that  prior UTI may have not been fully treated due to susceptibility chart.  Will treat with Keflex at this time as last organism was susceptible to Keflex.  Will treat empirically at this time. Urine cx pending. The patient was advised to call or come back to clinic if she does not see an improvement in symptoms, or worsens with the above plan.   [name], PA-C  [facility] 09/28/2017 9:18 AM

## 2017-09-29 LAB — URINE CULTURE

## 2017-09-30 ENCOUNTER — Other Ambulatory Visit: Payer: Self-pay | Admitting: Physician Assistant

## 2017-09-30 DIAGNOSIS — K7460 Unspecified cirrhosis of liver: Secondary | ICD-10-CM

## 2017-09-30 DIAGNOSIS — Z1239 Encounter for other screening for malignant neoplasm of breast: Secondary | ICD-10-CM

## 2017-09-30 DIAGNOSIS — E278 Other specified disorders of adrenal gland: Secondary | ICD-10-CM

## 2017-09-30 NOTE — Progress Notes (Signed)
Please call patient and report that I have placed a referral for GI.  It looks like she has seen Dr. Benson Norway in the past.  I have placed a referral to this office due to her CT findings, which we discussed in office today.  I have also placed an order for an image to further look at the gland on top of her kidneys.  She should hear from that in a couple weeks.  Her urine culture did not grow any specific bacteria so the antibiotic she was placed on should be working and she should complete the entire antibiotic dose.  Please let me know she has any other questions or concerns.   Orders Placed This Encounter  Procedures  . CT ADRENAL ABD WO    Standing Status:   Future    Standing Expiration Date:   01/01/2019    Order Specific Question:   Is patient pregnant?    Answer:   No    Order Specific Question:   Preferred imaging location?    Answer:   External    Order Specific Question:   Is Oral Contrast requested for this exam?    Answer:   Yes, Per Radiology protocol    Order Specific Question:   Radiology Contrast Protocol - do NOT remove file path    Answer:   [email or web address]    Order Specific Question:   ** REASON FOR EXAM (FREE TEXT)    Answer:   Two indeterminate left adrenal lesions follow-up adrenal protocol  . Ambulatory referral to Gastroenterology    Referral Priority:   Routine    Referral Type:   Consultation    Referral Reason:   Specialty Services Required    Number of Visits Requested:   1

## 2017-10-12 NOTE — Progress Notes (Signed)
10/13/2017 at 3:38 PM  Molly Payne / DOB: 03/20/1961 / MRN: [id number]  The patient has Diabetes mellitus type 2 in obese ([place]); Morbid obesity (Molly Payne); Dyslipidemia; Family history of heart disease; Smoker; Agatston coronary artery calcium score less than 100; and Cough due to ACE inhibitor on their problem list.  SUBJECTIVE  Molly Payne is a 57 y.o. female who complains of dysuria and urinary frequency x 5 days. She denies hematuria, urinary urgency, flank pain, abdominal pain, pelvic pain, cloudy malordorous urine, genital rash, genital irritation and vaginal discharge. Has not tried anything for relief. Most recent UTI prior to this was 09/28/2017. Tx with keflex. Notes her sx resolved but then returned 5 days ago. Of note, pt has T2DM. Last A1C in 07/2017 was 7.4. On metformin 1000 mg BID and januvia 120m daily. Has not been checking her sugars at all, "I hate pricking my finger." Has been eating more sugars recently.  Reports eating ice cream every day for "past few weeks".  Last ate an ice cream sandwich 1 hr ago. Denies dry mouth, polydipsia, polyphagia, abdominal pain, nausea, vomiting, blurred vision, palpitations, SOB, and chest pain. No PMH of pancreatitis or thyroid cancer. No FH of thyroid cancer.     She  has a past medical history of Allergy, Diabetes mellitus without complication ([place], and Hyperlipidemia.    Medications reviewed and updated by myself where necessary, and exist elsewhere in the encounter.   Molly Payne No Known Allergies. She  reports that she has been smoking cigarettes.  She has a 32.00 pack-year smoking history. She has never used smokeless tobacco. She reports that she does not drink alcohol or use drugs. She  reports that she currently engages in sexual activity. The patient  has a past surgical history that includes Appendectomy; Cholecystectomy; and Abdominal hysterectomy.  Her family history includes Heart attack in her sister; Heart disease in her  mother, sister, and sister.  Review of Systems  Constitutional: Positive for malaise/fatigue. Negative for chills, diaphoresis and fever.  Genitourinary: Negative for flank pain.  Neurological: Negative for dizziness and headaches.    OBJECTIVE  Her  height is 5' 7.32" (1.71 m) and weight is 271 lb (122.9 kg). Her oral temperature is 98 F (36.7 C). Her blood pressure is 122/72 and her pulse is 78. Her respiration is 16 and oxygen saturation is 98%.  The patient's body mass index is 42.04 kg/m.  Physical Exam  Constitutional: She is oriented to person, place, and time. She appears well-developed and well-nourished. No distress.  HENT:  Head: Normocephalic and atraumatic.  Mouth/Throat: Uvula is midline. Mucous membranes are dry. No posterior oropharyngeal edema or posterior oropharyngeal erythema. No tonsillar exudate.  Eyes: Conjunctivae are normal.  Neck: Normal range of motion.  Pulmonary/Chest: Effort normal.  Abdominal: Soft. Normal appearance. There is no tenderness. There is no CVA tenderness.  Neurological: She is alert and oriented to person, place, and time.  Skin: Skin is warm and dry.  Psychiatric: She has a normal mood and affect.  Vitals reviewed.   Results for orders placed or performed in visit on 10/13/17 (from the past 24 hour(s))  POCT glucose (manual entry)     Status: Abnormal   Collection Time: 10/13/17  2:49 PM  Result Value Ref Range   POC Glucose 444 (A) 70 - 99 mg/dl  POCT glycosylated hemoglobin (Hb A1C)     Status: Abnormal   Collection Time: 10/13/17  3:23 PM  Result Value  Ref Range   Hemoglobin A1C 7.4 (A) 4.0 - 5.6 %   HbA1c POC (<> result, manual entry)  4.0 - 5.6 %   HbA1c, POC (prediabetic range)  5.7 - 6.4 %   HbA1c, POC (controlled diabetic range)  0.0 - 7.0 %  POCT urinalysis dipstick     Status: Abnormal   Collection Time: 10/13/17  3:25 PM  Result Value Ref Range   Color, UA yellow yellow   Clarity, UA clear clear   Glucose, UA  >=1,000 (A) negative mg/dL   Bilirubin, UA negative negative   Ketones, POC UA negative negative mg/dL   Spec Grav, UA 1.010 1.010 - 1.025   Blood, UA trace-intact (A) negative   pH, UA 5.5 5.0 - 8.0   Protein Ur, POC negative negative mg/dL   Urobilinogen, UA 0.2 0.2 or 1.0 E.U./dL   Nitrite, UA Negative Negative   Leukocytes, UA Small (1+) (A) Negative  POCT Microscopic Urinalysis ([facility])     Status: Abnormal   Collection Time: 10/13/17  3:28 PM  Result Value Ref Range   WBC,UR,HPF,POC Too numerous to count  (A) None WBC/hpf   RBC,UR,HPF,POC Few (A) None RBC/hpf   Bacteria Many (A) None, Too numerous to count   Mucus Absent Absent   Epithelial Cells, UR Per Microscopy Few (A) None, Too numerous to count cells/hpf  POCT Wet + KOH Prep     Status: Abnormal   Collection Time: 10/13/17  3:29 PM  Result Value Ref Range   Yeast by KOH Absent Absent   Yeast by wet prep Absent Absent   WBC by wet prep None (A) Few   Clue Cells Wet Prep HPF POC None None   Trich by wet prep Absent Absent   Bacteria Wet Prep HPF POC Few Few   Epithelial Cells By [employer] Pref ([facility]) Moderate (A) None, Few, Too numerous to count   RBC,UR,HPF,POC None None RBC/hpf    Wt Readings from Last 3 Encounters:  10/13/17 271 lb (122.9 kg)  09/28/17 272 lb (123.4 kg)  08/14/17 272 lb 12.8 oz (123.7 kg)     ASSESSMENT & PLAN  Toshi was seen today for urinary frequency and dysuria.  Diagnoses and all orders for this visit:  Urinary frequency -     POCT urinalysis dipstick -     POCT Microscopic Urinalysis ([facility]) -     Urine Culture -     POCT glucose (manual entry)  Dysuria -     POCT Wet + KOH Prep  Type 2 diabetes mellitus without complication, without long-term current use of insulin (HCC) -     POCT glucose (manual entry) -     Continuous Blood Gluc Sensor (FREESTYLE LIBRE 14 DAY SENSOR) MISC; 1 Units by Does not apply route every 21 ( twenty-one) days. -     Continuous Blood Gluc Receiver (FREESTYLE  LIBRE 14 DAY READER) DEVI; 1 Units by Does not apply route daily as needed. -     POCT glycosylated hemoglobin (Hb A1C)  Hyperglycemia -     Fructosamine -     CMP14+EGFR -     CBC with Differential/Platelet  Other orders -     ciprofloxacin (CIPRO) 250 MG tablet; Take 1 tablet (250 mg total) by mouth 2 (two) times daily.   UA dip with small leukocytes and urine micro with too numerous to count white blood cells and many bacteria.  This point with history are suggestive of UTI.  Due to recurrent  UTI, will treat empirically at this time with Cipro. Urine cx pending.  Point-of-care glucose 444 (pt did just consume ice cream sandwich) and glucosuria noted.  No ketones in urine.  Concerned that frequent consumption of ice cream is leading to uncontrolled diabetes.  However, A1c 7.4.   Suppose glucosuria on urine dip could be due to UTI? Will check fructosamine.  No yeast on wet prep, which is reassuring.  Patient encouraged to discontinue daily consumption of ice cream.  She is motivated to make this change.  Once again, educated on potential side effects of uncontrolled diabetes.  Given educational material.  Patient is going to follow-up in about 5 days for repeat urinalysis.  The patient was advised to call or come back to clinic if she does not see an improvement in symptoms, or worsens with the above plan.   [name], PA-C  [facility] at [facility] 10/13/2017 3:38 PM

## 2017-10-13 ENCOUNTER — Ambulatory Visit (INDEPENDENT_AMBULATORY_CARE_PROVIDER_SITE_OTHER): Payer: 59 | Admitting: Physician Assistant

## 2017-10-13 ENCOUNTER — Encounter: Payer: Self-pay | Admitting: Physician Assistant

## 2017-10-13 ENCOUNTER — Other Ambulatory Visit: Payer: Self-pay

## 2017-10-13 VITALS — BP 122/72 | HR 78 | Temp 98.0°F | Resp 16 | Ht 67.32 in | Wt 271.0 lb

## 2017-10-13 DIAGNOSIS — E1165 Type 2 diabetes mellitus with hyperglycemia: Secondary | ICD-10-CM

## 2017-10-13 DIAGNOSIS — R300 Dysuria: Secondary | ICD-10-CM

## 2017-10-13 DIAGNOSIS — R350 Frequency of micturition: Secondary | ICD-10-CM

## 2017-10-13 DIAGNOSIS — Z87440 Personal history of urinary (tract) infections: Secondary | ICD-10-CM | POA: Diagnosis not present

## 2017-10-13 DIAGNOSIS — E119 Type 2 diabetes mellitus without complications: Secondary | ICD-10-CM

## 2017-10-13 DIAGNOSIS — R739 Hyperglycemia, unspecified: Secondary | ICD-10-CM

## 2017-10-13 LAB — POCT GLYCOSYLATED HEMOGLOBIN (HGB A1C): Hemoglobin A1C: 7.4 % — AB (ref 4.0–5.6)

## 2017-10-13 LAB — POCT URINALYSIS DIP (MANUAL ENTRY)
Bilirubin, UA: NEGATIVE
Ketones, POC UA: NEGATIVE mg/dL
NITRITE UA: NEGATIVE
PROTEIN UA: NEGATIVE mg/dL
SPEC GRAV UA: 1.01 (ref 1.010–1.025)
UROBILINOGEN UA: 0.2 U/dL
pH, UA: 5.5 (ref 5.0–8.0)

## 2017-10-13 LAB — POCT WET + KOH PREP
TRICH BY WET PREP: ABSENT
YEAST BY WET PREP: ABSENT
Yeast by KOH: ABSENT

## 2017-10-13 LAB — GLUCOSE, POCT (MANUAL RESULT ENTRY): POC Glucose: 444 mg/dl — AB (ref 70–99)

## 2017-10-13 LAB — POC MICROSCOPIC URINALYSIS (UMFC): Mucus: ABSENT

## 2017-10-13 MED ORDER — DULAGLUTIDE 0.75 MG/0.5ML ~~LOC~~ SOAJ: 0.5000 mL | SUBCUTANEOUS | 2 refills | Status: DC

## 2017-10-13 MED ORDER — FREESTYLE LIBRE 14 DAY READER DEVI: 1.0000 [IU] | Freq: Every day | 0 refills | Status: DC | PRN

## 2017-10-13 MED ORDER — FREESTYLE LIBRE 14 DAY SENSOR MISC: 1.0000 [IU] | 0 refills | Status: DC

## 2017-10-13 MED ORDER — CIPROFLOXACIN HCL 250 MG PO TABS
250.0000 mg | ORAL_TABLET | Freq: Two times a day (BID) | ORAL | 0 refills | Status: DC
Start: 2017-10-13 — End: 2017-12-17

## 2017-10-13 NOTE — Patient Instructions (Addendum)
Your results indicate you have a UTI. I have given you a prescription for an antibiotic. Please take with food. I have sent off a urine culture and we should have those results in 48 hours. If your symptoms worsen while you are awaiting these results or you develop fever, chills, flank pian, nausea and vomiting, please seek care immediately.   Your results also show elevated sugar and glucose in your urine. The elevated sugar is likely due to recent ingestion of ice cream. However, the glucose in your urine is typical when diabetes is uncontrolled but could be present with infection. Therefore I have given you a prescription for antibiotic, which should clear your infection. I have collected some labs today to further look at your glucose average over the past 2 weeks. I would like you to return on Monday for repeat urine tests and fasting sugar. Please try and limit the amounts of sugar and carbs you intake.   Urinary Tract Infection, Adult A urinary tract infection (UTI) is an infection of any part of the urinary tract. The urinary tract includes the:  Kidneys.  Ureters.  Bladder.  Urethra.  These organs make, store, and get rid of pee (urine) in the body. Follow these instructions at home:  Take over-the-counter and prescription medicines only as told by your doctor.  If you were prescribed an antibiotic medicine, take it as told by your doctor. Do not stop taking the antibiotic even if you start to feel better.  Avoid the following drinks: ? Alcohol. ? Caffeine. ? Tea. ? Carbonated drinks.  Drink enough fluid to keep your pee clear or pale yellow.  Keep all follow-up visits as told by your doctor. This is important.  Make sure to: ? Empty your bladder often and completely. Do not to hold pee for long periods of time. ? Empty your bladder before and after sex. ? Wipe from front to back after a bowel movement if you are female. Use each tissue one time when you wipe. Contact  a doctor if:  You have back pain.  You have a fever.  You feel sick to your stomach (nauseous).  You throw up (vomit).  Your symptoms do not get better after 3 days.  Your symptoms go away and then come back. Get help right away if:  You have very bad back pain.  You have very bad lower belly (abdominal) pain.  You are throwing up and cannot keep down any medicines or water. This information is not intended to replace advice given to you by your health care provider. Make sure you discuss any questions you have with your health care provider. Document Released: 09/10/2007 Document Revised: 08/30/2015 Document Reviewed: 02/12/2015 Elsevier Interactive Patient Education  [employer].  Diabetes is a very complicated disease...lets simplify it.   An easy way to look at it to understand the complications is if you think of the extra sugar floating in your blood stream as glass shards floating through your blood stream.   Diabetes affects your small vessels first: 1) The glass shards (sugar) scrapes down the tiny blood vessels in your eyes and lead to diabetic retinopathy, the leading cause of blindness in the [place]. Diabetes is the leading cause of newly diagnosed adult (97 to 56 years of age) blindness in the [place].  2) The glass shards scratches down the tiny vessels of your legs leading to nerve damage called neuropathy and can lead to amputations of your feet. More than 60%  of all non-traumatic amputations of lower limbs occur in people with diabetes.  3) Over time the small vessels in your brain are shredded and closed off, individually this does not cause any problems but over a long period of time many of the small vessels being blocked can lead to Vascular Dementia.   4) Your kidney's are a filter system and have a "net" that keeps certain things in the body and lets bad things out. Sugar shreds this net and leads to kidney damage and eventually failure. Decreasing  the sugar that is destroying the net and certain blood pressure medications can help stop or decrease progression of kidney disease. Diabetes was the primary cause of kidney failure in 44 percent of all new cases in 2011.  5) Diabetes also destroys the small vessels in your penis that lead to erectile dysfunction. Eventually the vessels are so damaged that you may not be responsive to cialis or viagra.   Diabetes and your large vessels: Your larger vessels consist of your coronary arteries in your heart and the carotid vessels to your brain. Diabetes or even increased sugars put you at 300% increased risk of heart attack and stroke and this is why.. The sugar scrapes down your large blood vessels and your body sees this as an internal injury and tries to repair itself. Just like you get a scab on your skin, your platelets will stick to the blood vessel wall trying to heal it. This is why we have diabetics on low dose aspirin daily, this prevents the platelets from sticking and can prevent plaque formation. In addition, your body takes cholesterol and tries to shove it into the open wound. This is why we want your LDL, or bad cholesterol, below 70.   The combination of platelets and cholesterol over 5-10 years forms plaque that can break off and cause a heart attack or stroke.   PLEASE REMEMBER:   Diabetes is preventable! Up to 48 percent of complications and morbidities among individuals with type 2 diabetes can be prevented, delayed, or effectively treated and minimized with regular visits to a health professional, appropriate monitoring and medication, and a healthy diet and lifestyle.  Here is some information to help you keep your heart healthy: Move it! - Aim for 30 mins of activity every day. Take it slowly at first. Talk to [place] before starting any new exercise program.   Lose it.  -Body Mass Index (BMI) can indicate if you need to lose weight. A healthy range is 18.5-24.9. For a BMI  calculator, go to [employer].com  Waist Management -Excess abdominal fat is a risk factor for heart disease, diabetes, asthma, stroke and more. Ideal waist circumference is less than 35" for women and less than 40" for men.   Eat Right -focus on fruits, vegetables, whole grains, and meals you make yourself. Avoid foods with trans fat and high sugar/sodium content.   Snooze or Snore? - Loud snoring can be a sign of sleep apnea, a significant risk factor for high blood pressure, heart attach, stroke, and heart arrhythmias.  Kick the habit -Quit Smoking! Avoid second hand smoke. A single cigarette raises your blood pressure for 20 mins and increases the risk of heart attack and stroke for the next 24 hours.   Are Aspirin and Supplements right for you? -Add ENTERIC COATED low dose 81 mg Aspirin daily OR can do every other day if you have easy bruising to protect your heart and head. As well as to reduce  risk of Colon Cancer by 20 %, Skin Cancer by 26 % , Melanoma by 46% and Pancreatic cancer by 60%  Say "No to Stress -There may be little you can do about problems that cause stress. However, techniques such as long walks, meditation, and exercise can help you manage it.   Start Now! - Make changes one at a time and set reasonable goals to increase your likelihood of success.         IF you received an x-ray today, you will receive an invoice from [facility]. Please contact [facility] at [phone] with questions or concerns regarding your invoice.   IF you received labwork today, you will receive an invoice from [place]. Please contact [facility] at [phone] with questions or concerns regarding your invoice.   Our billing staff will not be able to assist you with questions regarding bills from these companies.  You will be contacted with the lab results as soon as they are available. The fastest way to get your results is to activate your My Chart account.  Instructions are located on the last page of this paperwork. If you have not heard from [place] regarding the results in 2 weeks, please contact this office.

## 2017-10-14 ENCOUNTER — Encounter: Payer: Self-pay | Admitting: Physician Assistant

## 2017-10-14 LAB — CMP14+EGFR
A/G RATIO: 1.4 (ref 1.2–2.2)
ALT: 40 IU/L — AB (ref 0–32)
AST: 55 IU/L — AB (ref 0–40)
Albumin: 3.8 g/dL (ref 3.5–5.5)
Alkaline Phosphatase: 131 IU/L — ABNORMAL HIGH (ref 39–117)
BILIRUBIN TOTAL: 0.4 mg/dL (ref 0.0–1.2)
BUN/Creatinine Ratio: 15 (ref 9–23)
BUN: 10 mg/dL (ref 6–24)
CO2: 23 mmol/L (ref 20–29)
Calcium: 9.3 mg/dL (ref 8.7–10.2)
Chloride: 98 mmol/L (ref 96–106)
Creatinine, Ser: 0.68 mg/dL (ref 0.57–1.00)
GFR calc non Af Amer: 98 mL/min/{1.73_m2} (ref >59)
GFR, EST AFRICAN AMERICAN: 113 mL/min/{1.73_m2} (ref >59)
Globulin, Total: 2.8 g/dL (ref 1.5–4.5)
Glucose: 394 mg/dL — ABNORMAL HIGH (ref 65–99)
Potassium: 4.5 mmol/L (ref 3.5–5.2)
Sodium: 135 mmol/L (ref 134–144)
TOTAL PROTEIN: 6.6 g/dL (ref 6.0–8.5)

## 2017-10-14 LAB — CBC WITH DIFFERENTIAL/PLATELET
Basophils Absolute: 0 10*3/uL (ref 0.0–0.2)
Basos: 0 %
EOS (ABSOLUTE): 0.2 10*3/uL (ref 0.0–0.4)
Eos: 2 %
Hematocrit: 45.4 % (ref 34.0–46.6)
Hemoglobin: 15.5 g/dL (ref 11.1–15.9)
IMMATURE GRANS (ABS): 0.1 10*3/uL (ref 0.0–0.1)
Immature Granulocytes: 1 %
LYMPHS: 22 %
Lymphocytes Absolute: 2.3 10*3/uL (ref 0.7–3.1)
MCH: 30.8 pg (ref 26.6–33.0)
MCHC: 34.1 g/dL (ref 31.5–35.7)
MCV: 90 fL (ref 79–97)
Monocytes Absolute: 0.8 10*3/uL (ref 0.1–0.9)
Monocytes: 7 %
NEUTROS ABS: 7.1 10*3/uL — AB (ref 1.4–7.0)
Neutrophils: 68 %
Platelets: 119 10*3/uL — ABNORMAL LOW (ref 150–450)
RBC: 5.04 x10E6/uL (ref 3.77–5.28)
RDW: 14.4 % (ref 12.3–15.4)
WBC: 10.4 10*3/uL (ref 3.4–10.8)

## 2017-10-14 LAB — FRUCTOSAMINE: FRUCTOSAMINE: 254 umol/L (ref 0–285)

## 2017-10-15 LAB — URINE CULTURE

## 2017-10-16 ENCOUNTER — Other Ambulatory Visit: Payer: Self-pay | Admitting: Physician Assistant

## 2017-10-16 MED ORDER — NITROFURANTOIN MONOHYD MACRO 100 MG PO CAPS: 100.0000 mg | ORAL_CAPSULE | Freq: Two times a day (BID) | ORAL | 0 refills | Status: AC

## 2017-10-16 NOTE — Progress Notes (Signed)
Meds ordered this encounter  Medications  . nitrofurantoin, macrocrystal-monohydrate, (MACROBID) 100 MG capsule    Sig: Take 1 capsule (100 mg total) by mouth 2 (two) times daily for 5 days.    Dispense:  10 capsule    Refill:  0    Order Specific Question:   Supervising Provider    Answer:   Wardell Honour [[id number]]

## 2017-10-19 ENCOUNTER — Encounter: Payer: Self-pay | Admitting: Physician Assistant

## 2017-10-19 ENCOUNTER — Other Ambulatory Visit: Payer: Self-pay

## 2017-10-19 ENCOUNTER — Ambulatory Visit (INDEPENDENT_AMBULATORY_CARE_PROVIDER_SITE_OTHER): Payer: 59 | Admitting: Physician Assistant

## 2017-10-19 VITALS — BP 132/74 | HR 92 | Temp 98.9°F | Resp 18 | Ht 67.01 in | Wt 269.4 lb

## 2017-10-19 DIAGNOSIS — N3001 Acute cystitis with hematuria: Secondary | ICD-10-CM | POA: Diagnosis not present

## 2017-10-19 DIAGNOSIS — E119 Type 2 diabetes mellitus without complications: Secondary | ICD-10-CM

## 2017-10-19 DIAGNOSIS — R81 Glycosuria: Secondary | ICD-10-CM

## 2017-10-19 LAB — POCT URINALYSIS DIP (MANUAL ENTRY)
BILIRUBIN UA: NEGATIVE mg/dL
Bilirubin, UA: NEGATIVE
GLUCOSE UA: NEGATIVE mg/dL
Leukocytes, UA: NEGATIVE
NITRITE UA: NEGATIVE
Protein Ur, POC: NEGATIVE mg/dL
RBC UA: NEGATIVE
Spec Grav, UA: <=1.005 — AB (ref 1.010–1.025)
Urobilinogen, UA: 0.2 E.U./dL
pH, UA: 5 (ref 5.0–8.0)

## 2017-10-19 NOTE — Progress Notes (Signed)
Molly Payne  MRN: [id number] DOB: 07/09/1960  Subjective:  Molly Payne is a 57 y.o. female seen in office today for a chief complaint of follow up on UTI and glucosuria. Please see last OV note for further details. Most recent urine cx +e.coli, which was resistant to cipro. Abx was switched.  She just started Macrobid  2 days ago.  Notes her urinary symptoms have completely improved since starting Macrobid.  Denies urinary frequency, dysuria, urinary urgency, hematuria, and flank pain. Since last visit, pt has drastically change her diet.  She has decreased the amount of carbs and sweets that she is not taking.  She has been checking her sugars very regularly and altering her diet based on her readings. Range has been 112-160 since changing her diet.  Given Rx for freestyle libre 14 sensor but she has not put it on yet.    Review of Systems  Constitutional: Negative for chills, diaphoresis and fever.  Gastrointestinal: Negative for nausea and vomiting.  Genitourinary: Negative for vaginal discharge.  Neurological: Negative for dizziness and light-headedness.    Patient Active Problem List   Diagnosis Date Noted  . Agatston coronary artery calcium score less than 100 11/05/2015  . Cough due to ACE inhibitor 11/05/2015  . Dyslipidemia 09/20/2015  . Family history of heart disease 09/20/2015  . Smoker 09/20/2015  . Diabetes mellitus type 2 in obese ([place]) 08/09/2014  . Morbid obesity ([place]) 08/09/2014    Current Outpatient Medications on File Prior to Visit  Medication Sig Dispense Refill  . blood glucose meter kit and supplies KIT Dispense based on patient and insurance preference. Use up to four times daily as directed. (FOR ICD-10 E11.69). 1 each 0  . Continuous Blood Gluc Receiver (FREESTYLE LIBRE 14 DAY READER) DEVI 1 Units by Does not apply route daily as needed. 1 Device 0  . losartan (COZAAR) 25 MG tablet Take 1 tablet (25 mg total) by mouth daily. 90 tablet 0  . meloxicam  (MOBIC) 7.5 MG tablet Take 1-2 tablets (7.5-15 mg total) by mouth daily as needed for pain. 30 tablet 0  . metFORMIN (GLUCOPHAGE) 1000 MG tablet Take 1 tablet (1,000 mg total) by mouth 2 (two) times daily. 180 tablet 0  . nitrofurantoin, macrocrystal-monohydrate, (MACROBID) 100 MG capsule Take 1 capsule (100 mg total) by mouth 2 (two) times daily for 5 days. 10 capsule 0  . rosuvastatin (CRESTOR) 40 MG tablet Take 1 tablet (40 mg total) by mouth daily. 90 tablet 0  . sitaGLIPtin (JANUVIA) 100 MG tablet Take 1 tablet (100 mg total) by mouth daily. 90 tablet 0  . aspirin 81 MG tablet Take 81 mg by mouth daily.    . cetirizine (ZYRTEC) 10 MG tablet Take 1 tablet (10 mg total) by mouth daily. (Patient not taking: Reported on 10/19/2017) 30 tablet 11  . ciprofloxacin (CIPRO) 250 MG tablet Take 1 tablet (250 mg total) by mouth 2 (two) times daily. (Patient not taking: Reported on 10/19/2017) 6 tablet 0  . Continuous Blood Gluc Sensor (FREESTYLE LIBRE 14 DAY SENSOR) MISC 1 Units by Does not apply route every 21 ( twenty-one) days. (Patient not taking: Reported on 10/19/2017) 6 each 0  . cyclobenzaprine (FLEXERIL) 5 MG tablet Take 1 tablet (5 mg total) by mouth 3 (three) times daily as needed for muscle spasms. (Patient not taking: Reported on 10/19/2017) 60 tablet 0  . fluticasone (FLONASE) 50 MCG/ACT nasal spray Use 2 sprays each nostril twice daily for 5 days,  then decrease to once daily (Patient not taking: Reported on 10/19/2017) 16 g 1  . Ipratropium-Albuterol (COMBIVENT) 20-100 MCG/ACT AERS respimat Inhale 1 puff into the lungs 4 (four) times daily. (Patient not taking: Reported on 10/19/2017) 4 g 0  . lidocaine (XYLOCAINE) 2 % solution Use as directed 5 mLs in the mouth or throat as needed for mouth pain. (Patient not taking: Reported on 10/19/2017) 100 mL 0  . Omega-3 Fatty Acids (FISH OIL) 1000 MG CAPS Take 2 capsules by mouth daily.    . ondansetron (ZOFRAN-ODT) 8 MG disintegrating tablet Take 1 tablet (8  mg total) by mouth every 8 (eight) hours as needed. (Patient not taking: Reported on 10/19/2017) 30 tablet 0   No current facility-administered medications on file prior to visit.     No Known Allergies    Social History   Socioeconomic History  . Marital status: Married    Spouse name: Not on file  . Number of children: 2  . Years of education: 34  . Highest education level: Not on file  Occupational History  . Occupation: Therapist, art  Social Needs  . Financial resource strain: Not on file  . Food insecurity:    Worry: Not on file    Inability: Not on file  . Transportation needs:    Medical: Not on file    Non-medical: Not on file  Tobacco Use  . Smoking status: Current Every Day Smoker    Packs/day: 1.00    Years: 32.00    Pack years: 32.00    Types: Cigarettes  . Smokeless tobacco: Never Used  Substance and Sexual Activity  . Alcohol use: No    Alcohol/week: 0.0 oz  . Drug use: No  . Sexual activity: Yes  Lifestyle  . Physical activity:    Days per week: Not on file    Minutes per session: Not on file  . Stress: Not on file  Relationships  . Social connections:    Talks on phone: Not on file    Gets together: Not on file    Attends religious service: Not on file    Active member of club or organization: Not on file    Attends meetings of clubs or organizations: Not on file    Relationship status: Not on file  . Intimate partner violence:    Fear of current or ex partner: Not on file    Emotionally abused: Not on file    Physically abused: Not on file    Forced sexual activity: Not on file  Other Topics Concern  . Not on file  Social History Narrative   epworth sleepiness scale = 3 (09/20/15)    Objective:  BP 132/74 (BP Location: Right Arm, Patient Position: Sitting, Cuff Size: Large)   Pulse 92   Temp 98.9 F (37.2 C) (Oral)   Resp 18   Ht 5' 7.01" (1.702 m)   Wt 269 lb 6.4 oz (122.2 kg)   SpO2 95%   BMI 42.18 kg/m   Physical Exam    Constitutional: She is oriented to person, place, and time. She appears well-developed and well-nourished. No distress.  HENT:  Head: Normocephalic and atraumatic.  Eyes: Conjunctivae are normal.  Neck: Normal range of motion.  Pulmonary/Chest: Effort normal.  Abdominal: Soft. Normal appearance. There is no tenderness. There is no CVA tenderness.  Neurological: She is alert and oriented to person, place, and time.  Skin: Skin is warm and dry.  Psychiatric: She has a normal  mood and affect.  Vitals reviewed.    Wt Readings from Last 3 Encounters:  10/19/17 269 lb 6.4 oz (122.2 kg)  10/13/17 271 lb (122.9 kg)  09/28/17 272 lb (123.4 kg)   Results for orders placed or performed in visit on 10/19/17 (from the past 24 hour(s))  POCT urinalysis dipstick     Status: Abnormal   Collection Time: 10/19/17 10:08 AM  Result Value Ref Range   Color, UA yellow yellow   Clarity, UA clear clear   Glucose, UA negative negative mg/dL   Bilirubin, UA negative negative   Ketones, POC UA negative negative mg/dL   Spec Grav, UA <=1.005 (A) 1.010 - 1.025   Blood, UA negative negative   pH, UA 5.0 5.0 - 8.0   Protein Ur, POC negative negative mg/dL   Urobilinogen, UA 0.2 0.2 or 1.0 E.U./dL   Nitrite, UA Negative Negative   Leukocytes, UA Negative Negative    Assessment and Plan :  1. Acute cystitis with hematuria Resolving. Complete abx.  - POCT urinalysis dipstick  2. Glucosuria Resolved.  3. Type 2 diabetes mellitus without complication, without long-term current use of insulin ([place]) Patient strongly encouraged to continue healthy lifestyle modifications. Given educational material on diabetes and nutrition. Planning on placing freestyle libre sensor today.  Continue monitoring sugars regularly.  Fasting blood sugar goal is 90-110.  Follow-up in 3 months for CPE and diabetes reevaluation. Encouraged to schedule apt with Dr. Benson Norway before f/u visit. She agrees to do so.   [name]  PA-C  [facility] 10/19/2017 10:09 AM

## 2017-10-19 NOTE — Patient Instructions (Addendum)
Your urine has significantly improved since last visit. Complete the prescribed antibiotic. Please keep up the work with dietary changes.  Return in 3 months for complete physical exam and diabetes follow-up.  Before next visit, please contact Dr. Benson Norway and schedule appointment for follow-up on liver issues.Thank you for letting me participate in your health and well being.   Diabetes Mellitus and Nutrition When you have diabetes (diabetes mellitus), it is very important to have healthy eating habits because your blood sugar (glucose) levels are greatly affected by what you eat and drink. Eating healthy foods in the appropriate amounts, at about the same times every day, can help you:  Control your blood glucose.  Lower your risk of heart disease.  Improve your blood pressure.  Reach or maintain a healthy weight.  Every person with diabetes is different, and each person has different needs for a meal plan. Your health care provider may recommend that you work with a diet and nutrition specialist (dietitian) to make a meal plan that is best for you. Your meal plan may vary depending on factors such as:  The calories you need.  The medicines you take.  Your weight.  Your blood glucose, blood pressure, and cholesterol levels.  Your activity level.  Other health conditions you have, such as heart or kidney disease.  How do carbohydrates affect me? Carbohydrates affect your blood glucose level more than any other type of food. Eating carbohydrates naturally increases the amount of glucose in your blood. Carbohydrate counting is a method for keeping track of how many carbohydrates you eat. Counting carbohydrates is important to keep your blood glucose at a healthy level, especially if you use insulin or take certain oral diabetes medicines. It is important to know how many carbohydrates you can safely have in each meal. This is different for every person. Your dietitian can help you  calculate how many carbohydrates you should have at each meal and for snack. Foods that contain carbohydrates include:  Bread, cereal, rice, pasta, and crackers.  Potatoes and corn.  Peas, beans, and lentils.  Milk and yogurt.  Fruit and juice.  Desserts, such as cakes, cookies, ice cream, and candy.  How does alcohol affect me? Alcohol can cause a sudden decrease in blood glucose (hypoglycemia), especially if you use insulin or take certain oral diabetes medicines. Hypoglycemia can be a life-threatening condition. Symptoms of hypoglycemia (sleepiness, dizziness, and confusion) are similar to symptoms of having too much alcohol. If your health care provider says that alcohol is safe for you, follow these guidelines:  Limit alcohol intake to no more than 1 drink per day for nonpregnant women and 2 drinks per day for men. One drink equals 12 oz of beer, 5 oz of wine, or 1 oz of hard liquor.  Do not drink on an empty stomach.  Keep yourself hydrated with water, diet soda, or unsweetened iced tea.  Keep in mind that regular soda, juice, and other mixers may contain a lot of sugar and must be counted as carbohydrates.  What are tips for following this plan? Reading food labels  Start by checking the serving size on the label. The amount of calories, carbohydrates, fats, and other nutrients listed on the label are based on one serving of the food. Many foods contain more than one serving per package.  Check the total grams (g) of carbohydrates in one serving. You can calculate the number of servings of carbohydrates in one serving by dividing the total carbohydrates  by 15. For example, if a food has 30 g of total carbohydrates, it would be equal to 2 servings of carbohydrates.  Check the number of grams (g) of saturated and trans fats in one serving. Choose foods that have low or no amount of these fats.  Check the number of milligrams (mg) of sodium in one serving. Most people should  limit total sodium intake to less than 2,300 mg per day.  Always check the nutrition information of foods labeled as "low-fat" or "nonfat". These foods may be higher in added sugar or refined carbohydrates and should be avoided.  Talk to your dietitian to identify your daily goals for nutrients listed on the label. Shopping  Avoid buying canned, premade, or processed foods. These foods tend to be high in fat, sodium, and added sugar.  Shop around the outside edge of the grocery store. This includes fresh fruits and vegetables, bulk grains, fresh meats, and fresh dairy. Cooking  Use low-heat cooking methods, such as baking, instead of high-heat cooking methods like deep frying.  Cook using healthy oils, such as olive, canola, or sunflower oil.  Avoid cooking with butter, cream, or high-fat meats. Meal planning  Eat meals and snacks regularly, preferably at the same times every day. Avoid going long periods of time without eating.  Eat foods high in fiber, such as fresh fruits, vegetables, beans, and whole grains. Talk to your dietitian about how many servings of carbohydrates you can eat at each meal.  Eat 4-6 ounces of lean protein each day, such as lean meat, chicken, fish, eggs, or tofu. 1 ounce is equal to 1 ounce of meat, chicken, or fish, 1 egg, or 1/4 cup of tofu.  Eat some foods each day that contain healthy fats, such as avocado, nuts, seeds, and fish. Lifestyle   Check your blood glucose regularly.  Exercise at least 30 minutes 5 or more days each week, or as told by your health care provider.  Take medicines as told by your health care provider.  Do not use any products that contain nicotine or tobacco, such as cigarettes and e-cigarettes. If you need help quitting, ask your health care provider.  Work with a Social worker or diabetes educator to identify strategies to manage stress and any emotional and social challenges. What are some questions to ask my health care  provider?  Do I need to meet with a diabetes educator?  Do I need to meet with a dietitian?  What number can I call if I have questions?  When are the best times to check my blood glucose? Where to find more information:  American Diabetes Association: diabetes.org/food-and-fitness/food  Academy of Nutrition and Dietetics: PokerClues.dk  [employer] of Diabetes and Digestive and Kidney Diseases (NIH): ContactWire.be Summary  A healthy meal plan will help you control your blood glucose and maintain a healthy lifestyle.  Working with a diet and nutrition specialist (dietitian) can help you make a meal plan that is best for you.  Keep in mind that carbohydrates and alcohol have immediate effects on your blood glucose levels. It is important to count carbohydrates and to use alcohol carefully. This information is not intended to replace advice given to you by your health care provider. Make sure you discuss any questions you have with your health care provider. Document Released: 12/19/2004 Document Revised: 04/28/2016 Document Reviewed: 04/28/2016 Elsevier Interactive Patient Education  [employer].    IF you received an x-ray today, you will receive an invoice from  [facility]. Please contact [facility] at [phone] with questions or concerns regarding your invoice.   IF you received labwork today, you will receive an invoice from [place]. Please contact [facility] at [phone] with questions or concerns regarding your invoice.   Our billing staff will not be able to assist you with questions regarding bills from these companies.  You will be contacted with the lab results as soon as they are available. The fastest way to get your results is to activate your My Chart account. Instructions are located on the last page of this  paperwork. If you have not heard from [place] regarding the results in 2 weeks, please contact this office.

## 2017-10-21 ENCOUNTER — Telehealth: Payer: Self-pay | Admitting: Physician Assistant

## 2017-10-21 NOTE — Telephone Encounter (Signed)
Per Timmothy Euler :  Pt t was at the office today and discuss a meter for her blood sugar level

## 2017-10-21 NOTE — Telephone Encounter (Unsigned)
Copied from [place] [id number]. Topic: Quick Communication - See Telephone Encounter >> Oct 21, 2017 11:08 AM [name] wrote: Pt is wanting to discuss a meter for her blood sugar level

## 2017-10-26 ENCOUNTER — Telehealth: Payer: Self-pay | Admitting: Physician Assistant

## 2017-10-26 NOTE — Telephone Encounter (Signed)
Copied from [place] [id number]. Topic: Quick Communication - See Telephone Encounter >> Oct 26, 2017 10:51 AM Burchel, Abbi R wrote: CRM for notification. See Telephone encounter for: 10/26/17.  Pt states she was seen [facility] [place] on 10/19/17 for UTI and was given abx, which she has finished.  Pt is still experiencing frequent/painful urination, and is requesting a call back from Brittany's nurse to discuss.    Pt: [phone]

## 2017-10-27 ENCOUNTER — Ambulatory Visit (INDEPENDENT_AMBULATORY_CARE_PROVIDER_SITE_OTHER): Payer: 59 | Admitting: Physician Assistant

## 2017-10-27 ENCOUNTER — Encounter: Payer: Self-pay | Admitting: Physician Assistant

## 2017-10-27 VITALS — BP 110/70 | HR 96 | Temp 98.8°F | Resp 16 | Ht 67.01 in | Wt 267.8 lb

## 2017-10-27 DIAGNOSIS — N390 Urinary tract infection, site not specified: Secondary | ICD-10-CM

## 2017-10-27 LAB — POCT URINALYSIS DIP (MANUAL ENTRY)
Bilirubin, UA: NEGATIVE
Glucose, UA: NEGATIVE mg/dL
Ketones, POC UA: NEGATIVE mg/dL
Nitrite, UA: NEGATIVE
PH UA: 6 (ref 5.0–8.0)
Protein Ur, POC: NEGATIVE mg/dL
RBC UA: NEGATIVE
SPEC GRAV UA: 1.01 (ref 1.010–1.025)
UROBILINOGEN UA: 0.2 U/dL

## 2017-10-27 LAB — POC MICROSCOPIC URINALYSIS (UMFC): Mucus: ABSENT

## 2017-10-27 MED ORDER — SULFAMETHOXAZOLE-TRIMETHOPRIM 800-160 MG PO TABS: 1.0000 | ORAL_TABLET | Freq: Two times a day (BID) | ORAL | 0 refills | Status: AC

## 2017-10-27 NOTE — Telephone Encounter (Signed)
I spoke with the pt and notified her of Brittany's recommendations and she verbalized understanding I transferred to Jenkins at front desk to schedule her at 4:20 today

## 2017-10-27 NOTE — Telephone Encounter (Signed)
Please advise 

## 2017-10-27 NOTE — Patient Instructions (Addendum)
Your results indicate you have a UTI. I have given you a prescription for an antibiotic to take for 7 dyas. Please take with food. I have sent off a urine culture and we should have those results in 48 hours. If your symptoms worsen while you are awaiting these results or you develop fever, chills, flank pian, nausea and vomiting, please seek care immediately.    I have also placed a referral to urology. They should contact you within 2 weeks. Follow up as planned.   Thank you for letting me participate in your health and well being.  Urinary Tract Infection, Adult A urinary tract infection (UTI) is an infection of any part of the urinary tract. The urinary tract includes the:  Kidneys.  Ureters.  Bladder.  Urethra.  These organs make, store, and get rid of pee (urine) in the body. Follow these instructions at home:  Take over-the-counter and prescription medicines only as told by your doctor.  If you were prescribed an antibiotic medicine, take it as told by your doctor. Do not stop taking the antibiotic even if you start to feel better.  Avoid the following drinks: ? Alcohol. ? Caffeine. ? Tea. ? Carbonated drinks.  Drink enough fluid to keep your pee clear or pale yellow.  Keep all follow-up visits as told by your doctor. This is important.  Make sure to: ? Empty your bladder often and completely. Do not to hold pee for long periods of time. ? Empty your bladder before and after sex. ? Wipe from front to back after a bowel movement if you are female. Use each tissue one time when you wipe. Contact a doctor if:  You have back pain.  You have a fever.  You feel sick to your stomach (nauseous).  You throw up (vomit).  Your symptoms do not get better after 3 days.  Your symptoms go away and then come back. Get help right away if:  You have very bad back pain.  You have very bad lower belly (abdominal) pain.  You are throwing up and cannot keep down any  medicines or water. This information is not intended to replace advice given to you by your health care provider. Make sure you discuss any questions you have with your health care provider. Document Released: 09/10/2007 Document Revised: 08/30/2015 Document Reviewed: 02/12/2015 Elsevier Interactive Patient Education  2018 [place]. Sulfamethoxazole; Trimethoprim, SMX-TMP tablets What is this medicine? SULFAMETHOXAZOLE; TRIMETHOPRIM or SMX-TMP (suhl fuh meth OK suh zohl; trye METH oh prim) is a combination of a sulfonamide antibiotic and a second antibiotic, trimethoprim. It is used to treat or prevent certain kinds of bacterial infections. It will not work for colds, flu, or other viral infections. This medicine may be used for other purposes; ask your health care provider or pharmacist if you have questions. COMMON BRAND NAME(S): Bacter-Aid DS, Bactrim, Bactrim DS, Septra, Septra DS What should I tell my health care provider before I take this medicine? They need to know if you have any of these conditions: -anemia -asthma -being treated with anticonvulsants -if you frequently drink alcohol containing drinks -kidney disease -liver disease -low level of folic acid or ZOXWRUE-4-VWUJWJXBJ dehydrogenase -poor nutrition or malabsorption -porphyria -severe allergies -thyroid disorder -an unusual or allergic reaction to sulfamethoxazole, trimethoprim, sulfa drugs, other medicines, foods, dyes, or preservatives -pregnant or trying to get pregnant -breast-feeding How should I use this medicine? Take this medicine by mouth with a full glass of water. Follow the directions on the  prescription label. Take your medicine at regular intervals. Do not take it more often than directed. Do not skip doses or stop your medicine early. Talk to your pediatrician regarding the use of this medicine in children. Special care may be needed. This medicine has been used in children as young as 2 months of  age. Overdosage: If you think you have taken too much of this medicine contact a poison control center or emergency room at once. NOTE: This medicine is only for you. Do not share this medicine with others. What if I miss a dose? If you miss a dose, take it as soon as you can. If it is almost time for your next dose, take only that dose. Do not take double or extra doses. What may interact with this medicine? Do not take this medicine with any of the following medications: -aminobenzoate potassium -dofetilide -metronidazole This medicine may also interact with the following medications: -ACE inhibitors like benazepril, enalapril, lisinopril, and ramipril -birth control pills -cyclosporine -digoxin -diuretics -indomethacin -medicines for diabetes -methenamine -methotrexate -phenytoin -potassium supplements -pyrimethamine -sulfinpyrazone -tricyclic antidepressants -warfarin This list may not describe all possible interactions. Give your health care provider a list of all the medicines, herbs, non-prescription drugs, or dietary supplements you use. Also tell them if you smoke, drink alcohol, or use illegal drugs. Some items may interact with your medicine. What should I watch for while using this medicine? Tell your doctor or health care professional if your symptoms do not improve. Drink several glasses of water a day to reduce the risk of kidney problems. Do not treat diarrhea with over the counter products. Contact your doctor if you have diarrhea that lasts more than 2 days or if it is severe and watery. This medicine can make you more sensitive to the sun. Keep out of the sun. If you cannot avoid being in the sun, wear protective clothing and use a sunscreen. Do not use sun lamps or tanning beds/booths. What side effects may I notice from receiving this medicine? Side effects that you should report to your doctor or health care professional as soon as possible: -allergic reactions  like skin rash or hives, swelling of the face, lips, or tongue -breathing problems -fever or chills, sore throat -irregular heartbeat, chest pain -joint or muscle pain -pain or difficulty passing urine -red pinpoint spots on skin -redness, blistering, peeling or loosening of the skin, including inside the mouth -unusual bleeding or bruising -unusually weak or tired -yellowing of the eyes or skin Side effects that usually do not require medical attention (report to your doctor or health care professional if they continue or are bothersome): -diarrhea -dizziness -headache -loss of appetite -nausea, vomiting -nervousness This list may not describe all possible side effects. Call your doctor for medical advice about side effects. You may report side effects to FDA at 1-800-FDA-1088. Where should I keep my medicine? Keep out of the reach of children. Store at room temperature between 20 to 25 degrees C (68 to 77 degrees F). Protect from light. Throw away any unused medicine after the expiration date. NOTE: This sheet is a summary. It may not cover all possible information. If you have questions about this medicine, talk to your doctor, pharmacist, or health care provider.  2018 Elsevier/Gold Standard (2012-10-29 14:38:26)    IF you received an x-ray today, you will receive an invoice from [facility]. Please contact [facility] at [phone] with questions or concerns regarding your invoice.   IF you received  labwork today, you will receive an invoice from [facility]. Please contact [facility] at [phone] with questions or concerns regarding your invoice.   Our billing staff will not be able to assist you with questions regarding bills from these companies.  You will be contacted with the lab results as soon as they are available. The fastest way to get your results is to activate your My Chart account. Instructions are located on the last page of this paperwork. If  you have not heard from [place] regarding the results in 2 weeks, please contact this office.

## 2017-10-27 NOTE — Progress Notes (Signed)
10/27/2017 at 5:12 PM  Molly Payne / DOB: 01/30/1961 / MRN: [id number]  The patient has Diabetes mellitus type 2 in obese Molly Payne); Morbid obesity (Molly Payne); Dyslipidemia; Family history of heart disease; Smoker; Agatston coronary artery calcium score less than 100; and Cough due to ACE inhibitor on their problem list.  SUBJECTIVE  Molly Payne is a 57 y.o. female who complains of dysuria, urinary frequency and urinary urgency x 3 days. Started the day she completed Macrobid for UTI. Was very bad for the first two days but then improved. Not having much issues today but still notices slight burn. Has taken AZO.  She denies hematuria, flank pain, abdominal pain, pelvic pain, genital rash, genital irritation and vaginal discharge.. Most recent UTI prior to this was 10/13/17, urine cx + for e.coli, prior to that was 09/28/17, urine cx with <10,000 bacteria, prior to that 08/14/17, urine cx + for Klebsiella pneumonaie. She is not sexually active. She denies wiping back to front after urination. Denies constipation and vaginal douching.   She  has a past medical history of Allergy, Diabetes mellitus without complication ([name]), and Hyperlipidemia.    Medications reviewed and updated by myself where necessary, and exist elsewhere in the encounter.   Molly Payne has No Known Allergies. She  reports that she has been smoking cigarettes.  She has a 32.00 pack-year smoking history. She has never used smokeless tobacco. She reports that she does not drink alcohol or use drugs. She  reports that she currently engages in sexual activity. The patient  has a past surgical history that includes Appendectomy; Cholecystectomy; and Abdominal hysterectomy.  Her family history includes Heart attack in her sister; Heart disease in her mother, sister, and sister.  Review of Systems  Constitutional: Negative for diaphoresis, fever and malaise/fatigue.  Gastrointestinal: Negative for nausea and vomiting.    OBJECTIVE  Her   height is 5' 7.01" (1.702 m) and weight is 267 lb 12.8 oz (121.5 kg). Her oral temperature is 98.8 F (37.1 C). Her blood pressure is 110/70 and her pulse is 96. Her respiration is 16 and oxygen saturation is 94%.  The patient's body mass index is 41.93 kg/m.  Physical Exam  Constitutional: She is oriented to person, place, and time. She appears well-developed and well-nourished. No distress.  HENT:  Head: Normocephalic and atraumatic.  Eyes: Conjunctivae are normal.  Neck: Normal range of motion.  Pulmonary/Chest: Effort normal.  Abdominal: Soft. Normal appearance. There is no tenderness. There is no CVA tenderness.  Neurological: She is alert and oriented to person, place, and time.  Skin: Skin is warm and dry.  Psychiatric: She has a normal mood and affect.  Vitals reviewed.   Results for orders placed or performed in visit on 10/27/17 (from the past 24 hour(s))  POCT urinalysis dipstick     Status: Abnormal   Collection Time: 10/27/17  4:50 PM  Result Value Ref Range   Color, UA yellow yellow   Clarity, UA clear clear   Glucose, UA negative negative mg/dL   Bilirubin, UA negative negative   Ketones, POC UA negative negative mg/dL   Spec Grav, UA 1.010 1.010 - 1.025   Blood, UA negative negative   pH, UA 6.0 5.0 - 8.0   Protein Ur, POC negative negative mg/dL   Urobilinogen, UA 0.2 0.2 or 1.0 E.U./dL   Nitrite, UA Negative Negative   Leukocytes, UA Small (1+) (A) Negative  POCT Microscopic Urinalysis ([facility])     Status:  Abnormal   Collection Time: 10/27/17  5:04 PM  Result Value Ref Range   WBC,UR,HPF,POC Many (A) None WBC/hpf   RBC,UR,HPF,POC None None RBC/hpf   Bacteria Few (A) None, Too numerous to count   Mucus Absent Absent   Epithelial Cells, UR Per Microscopy Few (A) None, Too numerous to count cells/hpf   Wt Readings from Last 3 Encounters:  10/27/17 267 lb 12.8 oz (121.5 kg)  10/19/17 269 lb 6.4 oz (122.2 kg)  10/13/17 271 lb (122.9 kg)    ASSESSMENT &  PLAN  [name] was seen today for medication refill.  Diagnoses and all orders for this visit:  Recurrent UTI -     POCT urinalysis dipstick -     POCT Microscopic Urinalysis ([facility]) -     Urine Culture -     Ambulatory referral to Urology   Meds ordered this encounter  Medications  . sulfamethoxazole-trimethoprim (BACTRIM DS,SEPTRA DS) 800-160 MG tablet    Sig: Take 1 tablet by mouth 2 (two) times daily for 7 days.    Dispense:  14 tablet    Refill:  0    Order Specific Question:   Supervising Provider    Answer:   Wardell Honour [[id number]]    Hx, UA, and urine micro suggestive of UTI. Suspect she did not receive long enough tx with macrobid after last UTI as she was initially tx with cipro but urine cx showed e.coli resistant to cipro and abx had to be switched.  Will treat empirically at this time with extended course of bactrim. Urine cx pending. Due to recurrent UTIs with no clear etiology, will refer to urology for further evaluation. The patient was advised to call or come back to clinic if she does not see an improvement in symptoms, or worsens with the above plan.   Molly Delaine, PA-C  [facility] 10/27/2017 5:12 PM

## 2017-10-27 NOTE — Telephone Encounter (Signed)
Sounds suspicious for another UTI. Could you please call her and have her make an appointment with me today? We will need to get another urine sample as she has not grown the same bacteria the last few times. Also, we may need to place a referral to urology to discuss recurrent UTIs. Please let her know I have a few appointments available today.

## 2017-10-28 LAB — URINE CULTURE: ORGANISM ID, BACTERIA: NO GROWTH

## 2017-10-29 ENCOUNTER — Encounter: Payer: Self-pay | Admitting: Physician Assistant

## 2017-11-12 ENCOUNTER — Telehealth: Payer: Self-pay | Admitting: Physician Assistant

## 2017-11-12 NOTE — Telephone Encounter (Signed)
Contacted pt insurance [employer] to see where pt ct prior auth was at in the process. They stated that needed additional clinical information. Faxed additional clinical information to [employer] at [phone].

## 2017-11-14 ENCOUNTER — Other Ambulatory Visit: Payer: Self-pay | Admitting: Physician Assistant

## 2017-11-14 DIAGNOSIS — E119 Type 2 diabetes mellitus without complications: Secondary | ICD-10-CM

## 2017-11-16 NOTE — Telephone Encounter (Signed)
Januvia refill Last RF: 08/03/17; # 90;no refills Last OV: 10/19/17 PCP: [name] Pharm.: [facility] [address], [place]  Medication refilled per protocol

## 2017-11-23 ENCOUNTER — Ambulatory Visit: Payer: 59 | Admitting: Emergency Medicine

## 2017-11-24 ENCOUNTER — Encounter: Payer: Self-pay | Admitting: Physician Assistant

## 2017-11-24 ENCOUNTER — Other Ambulatory Visit: Payer: Self-pay

## 2017-11-24 ENCOUNTER — Ambulatory Visit (INDEPENDENT_AMBULATORY_CARE_PROVIDER_SITE_OTHER): Payer: 59 | Admitting: Physician Assistant

## 2017-11-24 VITALS — BP 114/70 | HR 95 | Temp 97.8°F | Resp 20 | Ht 66.65 in | Wt 261.4 lb

## 2017-11-24 DIAGNOSIS — K047 Periapical abscess without sinus: Secondary | ICD-10-CM

## 2017-11-24 MED ORDER — AMOXICILLIN-POT CLAVULANATE 875-125 MG PO TABS: 1.0000 | ORAL_TABLET | Freq: Two times a day (BID) | ORAL | 0 refills | Status: DC

## 2017-11-24 NOTE — Progress Notes (Signed)
MRN: [id number] DOB: 08/20/1960  Subjective:   Molly Payne is a 57 y.o. female presenting for chief complaint of Sinus Problem (X 3-4 days) .  Reports 5 day history of right sided facial pain.  Has had some "crustiness in the right nostril" as well along with sneezing and itchy watery eyes. Has a bad tooth and is wondering if it is related to that.  Has tried ibuprofen with some relief. Denies fever, itchy watery eyes, ear pain, sore throat and cough, night sweats, nausea, vomiting, abdominal pain, diarrhea and visual disturbance. Has history of seasonal allergies, not using OTC antihistamines and flonase.  Current every day smoker. Has not seen the dentist in sometime but is planning on getting all of her teeth fixed soon.  Denies any other aggravating or relieving factors, no other questions or concerns.  Molly Payne has a current medication list which includes the following prescription(s): aspirin, blood glucose meter kit and supplies, cetirizine, ciprofloxacin, freestyle libre 14 day reader, freestyle libre 14 day sensor, cyclobenzaprine, fluticasone, ipratropium-albuterol, januvia, lidocaine, losartan, meloxicam, metformin, fish oil, ondansetron, and rosuvastatin. Also has No Known Allergies.  Molly Payne  has a past medical history of Allergy, Diabetes mellitus without complication ([place]), and Hyperlipidemia. Also  has a past surgical history that includes Appendectomy; Cholecystectomy; and Abdominal hysterectomy.   Objective:   Vitals: BP 114/70   Pulse 95   Temp 97.8 F (36.6 C) (Oral)   Resp 20   Ht 5' 6.65" (1.693 m)   Wt 261 lb 6.4 oz (118.6 kg)   BMI 41.37 kg/m   Physical Exam  Constitutional: She is oriented to person, place, and time. She appears well-developed and well-nourished. She does not have a sickly appearance. No distress.  HENT:  Head: Normocephalic and atraumatic.    Right Ear: Tympanic membrane, external ear and ear canal normal.  Left Ear: Tympanic membrane,  external ear and ear canal normal.  Nose: Mucosal edema ( mild b/l) present. No rhinorrhea. Right sinus exhibits no maxillary sinus tenderness and no frontal sinus tenderness. Left sinus exhibits no maxillary sinus tenderness and no frontal sinus tenderness.  Mouth/Throat: Oropharynx is clear and moist and mucous membranes are normal. Abnormal dentition. Dental caries present.    Eyes: Conjunctivae are normal.  Neck: Normal range of motion.  Pulmonary/Chest: Effort normal.  Lymphadenopathy:       Head (right side): No submental, no submandibular, no tonsillar, no preauricular, no posterior auricular and no occipital adenopathy present.       Head (left side): No submental, no submandibular, no tonsillar, no preauricular, no posterior auricular and no occipital adenopathy present.    She has no cervical adenopathy.       Right: No supraclavicular adenopathy present.       Left: No supraclavicular adenopathy present.  Neurological: She is alert and oriented to person, place, and time.  Skin: Skin is warm and dry.  Psychiatric: She has a normal mood and affect.  Vitals reviewed.   No results found for this or any previous visit (from the past 24 hour(s)).  Wt Readings from Last 3 Encounters:  11/24/17 261 lb 6.4 oz (118.6 kg)  10/27/17 267 lb 12.8 oz (121.5 kg)  10/19/17 269 lb 6.4 oz (122.2 kg)    Assessment and Plan :  1. Dental infection History and physical exam findings concerning for underlying dental abscess.  No fluctuance noted on exam the patient is very tender to the touch on the gum surrounding tooth which  she needs to have removed.  This is the source of patient's pain.  No tenderness to palpation of sinus cavity, which is reassuring.  Patient is overall well-appearing, no acute distress.  Vitals stable.  Recommend patient start oral antibiotics today and contact her dentist as soon as possible.  Patient agrees to do so.  Given strict return precautions. .-  amoxicillin-clavulanate (AUGMENTIN) 875-125 MG tablet; Take 1 tablet by mouth 2 (two) times daily.  Dispense: 20 tablet; Refill: 0  Side effects, risks, benefits, and alternatives of the medications and treatment plan prescribed today were discussed, and patient expressed understanding of the instructions given. No barriers to understanding were identified. Red flags discussed in detail. Pt expressed understanding regarding what to do in case of emergency/urgent symptoms.  [name], PA-C  [facility] 11/24/2017 9:30 AM

## 2017-11-24 NOTE — Patient Instructions (Addendum)
This is most consistent with a dental abscess.  Please start antibiotic today.  Contact your dentist today to schedule an appointment as soon as possible.  He may also use ibuprofen as prescribed for pain.  You should notice improvement in the next 24 to 48 hours.  If you do not or if any of your symptoms worsen, please seek care immediately.   We recommend that you schedule a mammogram for breast cancer screening. Typically, you do not need a referral to do this. Please contact a local imaging center to schedule your mammogram.  [facility] - [phone]  *ask for the Radiology Department [facility] - [phone] or [phone]  [facility] - [phone] [place] [phone] [facility] - [phone]  *ask for the [facility] - [phone]  *ask for the Radiology Department [facility] - [phone]  *ask for the Mammography Department [facility] - [phone]     Dental Abscess A dental abscess is pus in or around a tooth. Follow these instructions at home:  Take medicines only as told by your dentist.  If you were prescribed antibiotic medicine, finish all of it even if you start to feel better.  Rinse your mouth (gargle) often with salt water.  Do not drive or use heavy machinery, like a lawn mower, while taking pain medicine.  Do not apply heat to the outside of your mouth.  Keep all follow-up visits as told by your dentist. This is important. Contact a doctor if:  Your pain is worse, and medicine does not help. Get help right away if:  You have a fever or chills.  Your symptoms suddenly get worse.  You have a very bad headache.  You have problems breathing or swallowing.  You have trouble opening your mouth.  You have puffiness (swelling) in your neck or around your eye. This information is not intended to replace  advice given to you by your health care provider. Make sure you discuss any questions you have with your health care provider. Document Released: 08/08/2014 Document Revised: 08/30/2015 Document Reviewed: 03/21/2014 Elsevier Interactive Patient Education  2018 [employer].   IF you received an x-ray today, you will receive an invoice from [facility]. Please contact [facility] at [phone] with questions or concerns regarding your invoice.   IF you received labwork today, you will receive an invoice from [facility]. Please contact [facility] at [phone] with questions or concerns regarding your invoice.   Our billing staff will not be able to assist you with questions regarding bills from these companies.  You will be contacted with the lab results as soon as they are available. The fastest way to get your results is to activate your My Chart account. Instructions are located on the last page of this paperwork. If you have not heard from [place] regarding the results in 2 weeks, please contact this office.

## 2017-11-27 ENCOUNTER — Encounter: Payer: Self-pay | Admitting: Physician Assistant

## 2017-12-01 ENCOUNTER — Telehealth: Payer: Self-pay | Admitting: Physician Assistant

## 2017-12-01 MED ORDER — LOSARTAN POTASSIUM 25 MG PO TABS: 25.0000 mg | ORAL_TABLET | Freq: Every day | ORAL | 0 refills | Status: DC

## 2017-12-01 NOTE — Telephone Encounter (Signed)
Copied from [place] [id number]. Topic: Quick Communication - See Telephone Encounter >> Dec 01, 2017 11:06 AM Hewitt Shorts wrote: Pt is needing a refill on her [place] this could be a second request but I did not see anything in chart  Best [phone]

## 2017-12-17 ENCOUNTER — Ambulatory Visit (INDEPENDENT_AMBULATORY_CARE_PROVIDER_SITE_OTHER): Payer: 59 | Admitting: Physician Assistant

## 2017-12-17 ENCOUNTER — Other Ambulatory Visit: Payer: Self-pay

## 2017-12-17 ENCOUNTER — Encounter: Payer: Self-pay | Admitting: Physician Assistant

## 2017-12-17 VITALS — BP 120/72 | HR 100 | Temp 98.9°F | Resp 16 | Ht 66.25 in | Wt 256.0 lb

## 2017-12-17 DIAGNOSIS — J069 Acute upper respiratory infection, unspecified: Secondary | ICD-10-CM | POA: Diagnosis not present

## 2017-12-17 MED ORDER — HYDROCODONE-HOMATROPINE 5-1.5 MG/5ML PO SYRP
5.0000 mL | ORAL_SOLUTION | Freq: Three times a day (TID) | ORAL | 0 refills | Status: DC | PRN
Start: 2017-12-17 — End: 2018-02-22

## 2017-12-17 MED ORDER — GUAIFENESIN ER 1200 MG PO TB12: 1.0000 | ORAL_TABLET | Freq: Two times a day (BID) | ORAL | 1 refills | Status: DC | PRN

## 2017-12-17 MED ORDER — BENZONATATE 100 MG PO CAPS: 100.0000 mg | ORAL_CAPSULE | Freq: Three times a day (TID) | ORAL | 0 refills | Status: DC | PRN

## 2017-12-17 MED ORDER — IPRATROPIUM BROMIDE 0.03 % NA SOLN: 2.0000 | Freq: Two times a day (BID) | NASAL | 0 refills | Status: DC

## 2017-12-17 NOTE — Progress Notes (Signed)
MRN: [id number] DOB: 07/26/1960  Subjective:   Molly Payne is a 57 y.o. female presenting for chief complaint of Cough (chest x 3 days, "some clear mucus" pt states " I 'am going out of town and I don't want to be sick".) .  Reports 3 day history of runny nose, nasal congestion, sore throat, and dry cough.  Has produced some phlegm, but rarely.  Has also blowing out some mucus.  Both have been clear.  Denies fever, chills, wheezing, shortness of breath, dyspnea on exertion, chest pain, myalgias, nausea, vomiting, and abdominal pain.  Has only taken after lunch.  Has a sick contact with coworkers who have similar symptoms. Has history of seasonal allergies, not taking meds daily. No history of asthma. Patient has not had flu shot this season. Current smoker.  CT of her chest has showed emphysema but patient does not ever endorse symptoms of COPD.  Has albuterol inhaler at home.  Has not had to use it over the past 3 days.  Denies any other aggravating or relieving factors, no other questions or concerns.  Molly Payne has a current medication list which includes the following prescription(s): aspirin, blood glucose meter kit and supplies, cetirizine, freestyle libre 14 day reader, freestyle libre 14 day sensor, cyclobenzaprine, fluticasone, ipratropium-albuterol, januvia, lidocaine, losartan, metformin, fish oil, rosuvastatin, and meloxicam. Also has No Known Allergies.  [name]  has a past medical history of Allergy, Diabetes mellitus without complication ([place]), and Hyperlipidemia. Also  has a past surgical history that includes Appendectomy; Cholecystectomy; and Abdominal hysterectomy.   Objective:   Vitals: BP 120/72 (BP Location: Left Arm, Patient Position: Sitting, Cuff Size: Large)   Pulse 100   Temp 98.9 F (37.2 C) (Oral)   Resp 16   Ht 5' 6.25" (1.683 m)   Wt 256 lb (116.1 kg)   SpO2 93%   BMI 41.01 kg/m    Physical Exam  Constitutional: She is oriented to person, place, and time.  She appears well-developed and well-nourished. No distress.  HENT:  Head: Normocephalic and atraumatic.  Right Ear: Tympanic membrane, external ear and ear canal normal.  Left Ear: Tympanic membrane, external ear and ear canal normal.  Nose: Mucosal edema and rhinorrhea present. Right sinus exhibits no maxillary sinus tenderness and no frontal sinus tenderness. Left sinus exhibits no maxillary sinus tenderness and no frontal sinus tenderness.  Mouth/Throat: Uvula is midline and mucous membranes are normal. Abnormal dentition. Dental caries present. Posterior oropharyngeal erythema present. No posterior oropharyngeal edema or tonsillar abscesses. Tonsils are 1+ on the right. Tonsils are 1+ on the left. No tonsillar exudate.  Eyes: Conjunctivae are normal.  Neck: Normal range of motion.  Cardiovascular: Normal rate, regular rhythm, normal heart sounds and intact distal pulses.  Pulmonary/Chest: Effort normal and breath sounds normal. No accessory muscle usage. No respiratory distress. She has no decreased breath sounds. She has no wheezes. She has no rhonchi. She has no rales.  Lymphadenopathy:       Head (right side): No submental, no submandibular, no tonsillar, no preauricular, no posterior auricular and no occipital adenopathy present.       Head (left side): No submental, no submandibular, no tonsillar, no preauricular, no posterior auricular and no occipital adenopathy present.    She has no cervical adenopathy.       Right: No supraclavicular adenopathy present.       Left: No supraclavicular adenopathy present.  Neurological: She is alert and oriented to person, place, and time.  Skin: Skin is warm and dry.  Psychiatric: She has a normal mood and affect.  Vitals reviewed.   No results found for this or any previous visit (from the past 24 hour(s)).   Wt Readings from Last 3 Encounters:  12/17/17 256 lb (116.1 kg)  11/24/17 261 lb 6.4 oz (118.6 kg)  10/27/17 267 lb 12.8 oz (121.5  kg)    Assessment and Plan :  1. Acute upper respiratory infection - Likely viral in etiology d/t reassuring physical exam findings. Lungs CTAB. Her SpO2 is near her baseline. Discussed that she is at increased risk for worsening lung infection due to smoking hx. Discussed importance of seeking care immediately if she develops any wheezing, SOB, DOE, and chest tightness. Pt voices understanding.  - Advised supportive care, offered symptomatic relief. - Return to clinic if symptoms worsen or fail to improve in 5-7 days, otherwise return to clinic as needed. - benzonatate (TESSALON) 100 MG capsule; Take 1-2 capsules (100-200 mg total) by mouth 3 (three) times daily as needed for cough.  Dispense: 40 capsule; Refill: 0 - Guaifenesin (MUCINEX MAXIMUM STRENGTH) 1200 MG TB12; Take 1 tablet (1,200 mg total) by mouth every 12 (twelve) hours as needed.  Dispense: 14 tablet; Refill: 1 - HYDROcodone-homatropine (HYCODAN) 5-1.5 MG/5ML syrup; Take 5 mLs by mouth every 8 (eight) hours as needed for cough.  Dispense: 120 mL; Refill: 0 - ipratropium (ATROVENT) 0.03 % nasal spray; Place 2 sprays into both nostrils 2 (two) times daily.  Dispense: 30 mL; Refill: 0  [name], PA-C  [facility] 12/17/2017 9:15 AM

## 2017-12-17 NOTE — Patient Instructions (Addendum)
- We will treat this as a respiratory viral infection.  - I recommend you rest, drink plenty of fluids, eat light meals including soups.  - You may use cough syrup at night for your cough and sore throat, Tessalon pearls during the day if you want to stop the cough and mucinex if you want to bring stuff up. Be aware that cough syrup can definitely make you drowsy and sleepy so do not drive or operate any heavy machinery if it is affecting you during the day.  -continue taking flonase and zyrtec. You may use atrovent nasal spray for runny nose.  - You may also use Tylenol or ibuprofen over-the-counter for your sore throat. Tea recipe for sore throat: boil water, add 2 inches shaved ginger root, steep 15 minutes, add juice from 2 full lemons, and 2 tbsp honey. - Please let me know if you are not seeing any improvement or get worse in 5-7 days.     If you have lab work done today you will be contacted with your lab results within the next 2 weeks.  If you have not heard from [place] then please contact us. The fastest way to get your results is to register for My Chart.  Upper Respiratory Infection, Adult Most upper respiratory infections (URIs) are caused by a virus. A URI affects the nose, throat, and upper air passages. The most common type of URI is often called "the common cold." Follow these instructions at home:  Take medicines only as told by your doctor.  Gargle warm saltwater or take cough drops to comfort your throat as told by your doctor.  Use a warm mist humidifier or inhale steam from a shower to increase air moisture. This may make it easier to breathe.  Drink enough fluid to keep your pee (urine) clear or pale yellow.  Eat soups and other clear broths.  Have a healthy diet.  Rest as needed.  Go back to work when your fever is gone or your doctor says it is okay. ? You may need to stay home longer to avoid giving your URI to others. ? You can also wear a face mask and wash  your hands often to prevent spread of the virus.  Use your inhaler more if you have asthma.  Do not use any tobacco products, including cigarettes, chewing tobacco, or electronic cigarettes. If you need help quitting, ask your doctor. Contact a doctor if:  You are getting worse, not better.  Your symptoms are not helped by medicine.  You have chills.  You are getting more short of breath.  You have brown or red mucus.  You have yellow or brown discharge from your nose.  You have pain in your face, especially when you bend forward.  You have a fever.  You have puffy (swollen) neck glands.  You have pain while swallowing.  You have white areas in the back of your throat. Get help right away if:  You have very bad or constant: ? Headache. ? Ear pain. ? Pain in your forehead, behind your eyes, and over your cheekbones (sinus pain). ? Chest pain.  You have long-lasting (chronic) lung disease and any of the following: ? Wheezing. ? Long-lasting cough. ? Coughing up blood. ? A change in your usual mucus.  You have a stiff neck.  You have changes in your: ? Vision. ? Hearing. ? Thinking. ? Mood. This information is not intended to replace advice given to you by your health  care provider. Make sure you discuss any questions you have with your health care provider. Document Released: 09/10/2007 Document Revised: 11/25/2015 Document Reviewed: 06/29/2013 Elsevier Interactive Patient Education  2018 [employer].   IF you received an x-ray today, you will receive an invoice from [facility]. Please contact [facility] at [phone] with questions or concerns regarding your invoice.   IF you received labwork today, you will receive an invoice from [facility]. Please contact [facility] at [phone] with questions or concerns regarding your invoice.   Our billing staff will not be able to assist you with questions regarding bills from these  companies.  You will be contacted with the lab results as soon as they are available. The fastest way to get your results is to activate your My Chart account. Instructions are located on the last page of this paperwork. If you have not heard from [place] regarding the results in 2 weeks, please contact this office.

## 2017-12-28 ENCOUNTER — Other Ambulatory Visit: Payer: Self-pay | Admitting: Physician Assistant

## 2017-12-28 DIAGNOSIS — E119 Type 2 diabetes mellitus without complications: Secondary | ICD-10-CM

## 2018-01-21 ENCOUNTER — Encounter: Payer: 59 | Admitting: Physician Assistant

## 2018-01-24 ENCOUNTER — Other Ambulatory Visit: Payer: Self-pay | Admitting: Physician Assistant

## 2018-01-24 DIAGNOSIS — E119 Type 2 diabetes mellitus without complications: Secondary | ICD-10-CM

## 2018-01-25 NOTE — Telephone Encounter (Signed)
Requested Prescriptions  Pending Prescriptions Disp Refills  . metFORMIN (GLUCOPHAGE) 1000 MG tablet [Pharmacy Med Name: METFORMIN 1000MG TAB] 180 tablet 0    Sig: TAKE 1 TABLET BY MOUTH TWICE DAILY     Endocrinology:  Diabetes - Biguanides Passed - 01/24/2018  9:08 AM      Passed - Cr in normal range and within 360 days    Creat  Date Value Ref Range Status  11/22/2015 0.60 0.50 - 1.05 mg/dL Final    Comment:      For patients > or = 56 years of age: The upper reference limit for Creatinine is approximately 13% higher for people identified as African-American.      Creatinine, Ser  Date Value Ref Range Status  10/13/2017 0.68 0.57 - 1.00 mg/dL Final         Passed - HBA1C is between 0 and 7.9 and within 180 days    Hemoglobin A1C  Date Value Ref Range Status  10/13/2017 7.4 (A) 4.0 - 5.6 % Final   Hgb A1c MFr Bld  Date Value Ref Range Status  07/29/2017 7.4 (H) 4.8 - 5.6 % Final    Comment:             Prediabetes: 5.7 - 6.4          Diabetes: >6.4          Glycemic control for adults with diabetes: <7.0          Passed - eGFR in normal range and within 360 days    GFR, Est African American  Date Value Ref Range Status  06/20/2015 >89 >=60 mL/min Final   GFR calc Af Amer  Date Value Ref Range Status  10/13/2017 113 >59 mL/min/1.73 Final   GFR, Est Non African American  Date Value Ref Range Status  06/20/2015 >89 >=60 mL/min Final    Comment:      The estimated GFR is a calculation valid for adults (>=59 years old) that uses the CKD-EPI algorithm to adjust for age and sex. It is   not to be used for children, pregnant women, hospitalized patients,    patients on dialysis, or with rapidly changing kidney function. According to the NKDEP, eGFR >89 is normal, 60-89 shows mild impairment, 30-59 shows moderate impairment, 15-29 shows severe impairment and <15 is ESRD.      GFR calc non Af Amer  Date Value Ref Range Status  10/13/2017 98 >59 mL/min/1.73  Final         Passed - Valid encounter within last 6 months    Recent Outpatient Visits          1 month ago Acute upper respiratory infection   [facility] at [facility], [place] D, PA-C   2 months ago Dental infection   [facility] at [place], [place] D, PA-C   3 months ago Recurrent UTI   [facility] at [place], [place] D, PA-C   3 months ago Acute cystitis with hematuria   [facility] at [place], [place] D, PA-C   3 months ago Urinary frequency   [facility] at [facility], Reather Laurence, PA-C      Future Appointments            In 1 week Leonie Douglas, PA-C [facility] [place], [facility]

## 2018-02-04 ENCOUNTER — Encounter: Payer: 59 | Admitting: Physician Assistant

## 2018-02-22 ENCOUNTER — Encounter: Payer: Self-pay | Admitting: Physician Assistant

## 2018-02-22 ENCOUNTER — Other Ambulatory Visit: Payer: Self-pay

## 2018-02-22 ENCOUNTER — Ambulatory Visit (INDEPENDENT_AMBULATORY_CARE_PROVIDER_SITE_OTHER): Payer: 59 | Admitting: Physician Assistant

## 2018-02-22 VITALS — BP 131/77 | HR 72 | Temp 98.9°F | Resp 20 | Ht 65.24 in | Wt 251.6 lb

## 2018-02-22 DIAGNOSIS — R350 Frequency of micturition: Secondary | ICD-10-CM

## 2018-02-22 DIAGNOSIS — E119 Type 2 diabetes mellitus without complications: Secondary | ICD-10-CM | POA: Diagnosis not present

## 2018-02-22 DIAGNOSIS — Z0001 Encounter for general adult medical examination with abnormal findings: Secondary | ICD-10-CM

## 2018-02-22 DIAGNOSIS — Z1239 Encounter for other screening for malignant neoplasm of breast: Secondary | ICD-10-CM | POA: Diagnosis not present

## 2018-02-22 DIAGNOSIS — L304 Erythema intertrigo: Secondary | ICD-10-CM

## 2018-02-22 DIAGNOSIS — F17200 Nicotine dependence, unspecified, uncomplicated: Secondary | ICD-10-CM

## 2018-02-22 DIAGNOSIS — R82998 Other abnormal findings in urine: Secondary | ICD-10-CM | POA: Diagnosis not present

## 2018-02-22 DIAGNOSIS — Z6841 Body Mass Index (BMI) 40.0 and over, adult: Secondary | ICD-10-CM

## 2018-02-22 DIAGNOSIS — Z23 Encounter for immunization: Secondary | ICD-10-CM

## 2018-02-22 DIAGNOSIS — Z0000 Encounter for general adult medical examination without abnormal findings: Secondary | ICD-10-CM

## 2018-02-22 DIAGNOSIS — E785 Hyperlipidemia, unspecified: Secondary | ICD-10-CM

## 2018-02-22 LAB — POC MICROSCOPIC URINALYSIS (UMFC): Mucus: ABSENT

## 2018-02-22 LAB — POCT URINALYSIS DIP (MANUAL ENTRY)
Bilirubin, UA: NEGATIVE
Glucose, UA: NEGATIVE mg/dL
Ketones, POC UA: NEGATIVE mg/dL
NITRITE UA: NEGATIVE
PH UA: 6 (ref 5.0–8.0)
PROTEIN UA: NEGATIVE mg/dL
Spec Grav, UA: <=1.005 — AB (ref 1.010–1.025)
Urobilinogen, UA: 0.2 E.U./dL

## 2018-02-22 LAB — POCT GLYCOSYLATED HEMOGLOBIN (HGB A1C): Hemoglobin A1C: 5.7 % — AB (ref 4.0–5.6)

## 2018-02-22 MED ORDER — METFORMIN HCL 1000 MG PO TABS: 1000.0000 mg | ORAL_TABLET | Freq: Every day | ORAL | 1 refills | Status: DC

## 2018-02-22 MED ORDER — FREESTYLE LIBRE 14 DAY SENSOR MISC: 1.0000 | 3 refills | Status: DC

## 2018-02-22 MED ORDER — NITROFURANTOIN MONOHYD MACRO 100 MG PO CAPS: 100.0000 mg | ORAL_CAPSULE | Freq: Two times a day (BID) | ORAL | 0 refills | Status: AC

## 2018-02-22 MED ORDER — LOSARTAN POTASSIUM 25 MG PO TABS: 25.0000 mg | ORAL_TABLET | Freq: Every day | ORAL | 1 refills | Status: DC

## 2018-02-22 MED ORDER — FLUCONAZOLE 150 MG PO TABS: 150.0000 mg | ORAL_TABLET | Freq: Once | ORAL | 0 refills | Status: AC

## 2018-02-22 MED ORDER — NYSTATIN 100000 UNIT/GM EX POWD: Freq: Four times a day (QID) | CUTANEOUS | 1 refills | Status: DC

## 2018-02-22 MED ORDER — ROSUVASTATIN CALCIUM 40 MG PO TABS: 40.0000 mg | ORAL_TABLET | Freq: Every day | ORAL | 3 refills | Status: DC

## 2018-02-22 NOTE — Progress Notes (Signed)
Molly Payne  MRN: [id number] DOB: 03-10-1961  Subjective:  Pt is a 57 y.o. female who presents for annual physical exam. Pt is fasting.   Primary Preventative Screenings: Cervical Cancer:Years ago, cannot remember, epic shows 2016, [place] hysterectomy in 1990s after last birth. Family Planning: 2 children, no plan for more. PSH of hysterectomy. STI screening: No concern for STDs. Has a husband, but they are not very sexually active.  Breast Cancer: mammogram 2010, FH of breast cancer in mother, she cannot remember what age Colorectal Cancer: colonoscopy 2018, needs every 3 years Weight/Blood sugar/Diet/Exercise: Has changed diet over the past 3 months: diet consists of nuts, fruits, beef jerky, eggs, peppers, cheese, baked meats, vegetables, low sodium and sugar diet. Drinks mostly water. No structured exercise. OTC/Vit/Supp/Herbal: none Dentist/Optho: has not seen dentist in the past year; wants false teeth. Eye exam annually. Immunizations: Up-to-date on influenza, Tdap, and pneumococcal vaccine.  Has not had shingles vaccine yet.  Patient Active Problem List   Diagnosis Date Noted  . [place] coronary artery calcium score less than 100 11/05/2015  . Cough due to ACE inhibitor 11/05/2015  . Dyslipidemia 09/20/2015  . Family history of heart disease 09/20/2015  . Smoker 09/20/2015  . Diabetes mellitus type 2 in obese ([place]) 08/09/2014  . Morbid obesity ([place]) 08/09/2014    Current Outpatient Medications on File Prior to Visit  Medication Sig Dispense Refill  . aspirin 81 MG tablet Take 81 mg by mouth daily.    . blood glucose meter kit and supplies KIT Dispense based on patient and insurance preference. Use up to four times daily as directed. (FOR ICD-10 E11.69). 1 each 0  . cetirizine (ZYRTEC) 10 MG tablet Take 1 tablet (10 mg total) by mouth daily. 30 tablet 11  . Continuous Blood Gluc Receiver (FREESTYLE LIBRE 14 DAY READER) DEVI 1 Units by Does not apply route daily as needed.  1 Device 0  . cyclobenzaprine (FLEXERIL) 5 MG tablet Take 1 tablet (5 mg total) by mouth 3 (three) times daily as needed for muscle spasms. 60 tablet 0  . fluticasone (FLONASE) 50 MCG/ACT nasal spray Use 2 sprays each nostril twice daily for 5 days, then decrease to once daily 16 g 1  . Guaifenesin (MUCINEX MAXIMUM STRENGTH) 1200 MG TB12 Take 1 tablet (1,200 mg total) by mouth every 12 (twelve) hours as needed. 14 tablet 1  . ipratropium (ATROVENT) 0.03 % nasal spray Place 2 sprays into both nostrils 2 (two) times daily. 30 mL 0  . Ipratropium-Albuterol (COMBIVENT) 20-100 MCG/ACT AERS respimat Inhale 1 puff into the lungs 4 (four) times daily. 4 g 0  . lidocaine (XYLOCAINE) 2 % solution Use as directed 5 mLs in the mouth or throat as needed for mouth pain. 100 mL 0  . meloxicam (MOBIC) 7.5 MG tablet Take 1-2 tablets (7.5-15 mg total) by mouth daily as needed for pain. 30 tablet 0   No current facility-administered medications on file prior to visit.     No Known Allergies  Social History   Socioeconomic History  . Marital status: Married    Spouse name: Not on file  . Number of children: 2  . Years of education: 26  . Highest education level: Not on file  Occupational History  . Occupation: Therapist, art  Social Needs  . Financial resource strain: Not on file  . Food insecurity:    Worry: Not on file    Inability: Not on file  . Transportation needs:  Medical: Not on file    Non-medical: Not on file  Tobacco Use  . Smoking status: Current Every Day Smoker    Packs/day: 1.00    Years: 32.00    Pack years: 32.00    Types: Cigarettes  . Smokeless tobacco: Never Used  Substance and Sexual Activity  . Alcohol use: No    Alcohol/week: 0.0 standard drinks  . Drug use: No  . Sexual activity: Not Currently    Partners: Male  Lifestyle  . Physical activity:    Days per week: Not on file    Minutes per session: Not on file  . Stress: Not on file  Relationships  . Social  connections:    Talks on phone: Not on file    Gets together: Not on file    Attends religious service: Not on file    Active member of club or organization: Not on file    Attends meetings of clubs or organizations: Not on file    Relationship status: Not on file  Other Topics Concern  . Not on file  Social History Narrative   epworth sleepiness scale = 3 (09/20/15)    Past Surgical History:  Procedure Laterality Date  . ABDOMINAL HYSTERECTOMY    . APPENDECTOMY    . CHOLECYSTECTOMY      Family History  Problem Relation Age of Onset  . Heart disease Mother   . Heart disease Sister   . Heart disease Sister   . Heart attack Sister   . Colon cancer Neg Hx   . Colon polyps Neg Hx   . Esophageal cancer Neg Hx   . Rectal cancer Neg Hx   . Stomach cancer Neg Hx     Review of Systems  Constitutional: Negative for activity change, appetite change, chills, diaphoresis, fatigue, fever and unexpected weight change.  HENT: Negative for congestion, dental problem, drooling, ear discharge, ear pain, facial swelling, hearing loss, mouth sores, nosebleeds, postnasal drip, rhinorrhea, sinus pressure, sinus pain, sneezing, sore throat, tinnitus, trouble swallowing and voice change.   Eyes: Negative for photophobia, pain, discharge, redness, itching and visual disturbance.  Respiratory: Negative for apnea, cough, choking, chest tightness, shortness of breath, wheezing and stridor.   Cardiovascular: Negative for chest pain, palpitations and leg swelling.  Gastrointestinal: Negative for abdominal distention, abdominal pain, anal bleeding, blood in stool, constipation, diarrhea, nausea, rectal pain and vomiting.  Endocrine: Negative for cold intolerance, heat intolerance, polydipsia, polyphagia and polyuria.  Genitourinary: Positive for dysuria (for past 2 weeks, has tried cranberry tablets with no relief), frequency and urgency. Negative for decreased urine volume, difficulty urinating,  dyspareunia, enuresis, flank pain, genital sores, hematuria, menstrual problem, pelvic pain, vaginal bleeding, vaginal discharge and vaginal pain.  Musculoskeletal: Negative for arthralgias, back pain, gait problem, joint swelling, myalgias, neck pain and neck stiffness.  Skin: Negative for color change, pallor, rash and wound.  Allergic/Immunologic: Negative for environmental allergies, food allergies and immunocompromised state.  Neurological: Negative for dizziness, tremors, seizures, syncope, facial asymmetry, speech difficulty, weakness, light-headedness, numbness and headaches.  Hematological: Negative for adenopathy. Does not bruise/bleed easily.  Psychiatric/Behavioral: Negative for agitation, behavioral problems, confusion, decreased concentration, dysphoric mood, hallucinations, self-injury, sleep disturbance and suicidal ideas. The patient is not nervous/anxious and is not hyperactive.     Objective:  BP 131/77   Pulse 72   Temp 98.9 F (37.2 C) (Oral)   Resp 20   Ht 5' 5.24" (1.657 m)   Wt 251 lb 9.6 oz (114.1 kg)  SpO2 97%   BMI 41.57 kg/m   Physical Exam  Constitutional: She is oriented to person, place, and time. She appears well-developed and well-nourished. No distress.  HENT:  Head: Normocephalic and atraumatic.  Right Ear: Hearing, tympanic membrane, external ear and ear canal normal.  Left Ear: Hearing, tympanic membrane, external ear and ear canal normal.  Nose: Nose normal.  Mouth/Throat: Uvula is midline, oropharynx is clear and moist and mucous membranes are normal. Abnormal dentition. No oropharyngeal exudate.  Eyes: Pupils are equal, round, and reactive to light. Conjunctivae, EOM and lids are normal. No scleral icterus.  Neck: Trachea normal and normal range of motion. No thyroid mass and no thyromegaly present.  Cardiovascular: Normal rate, regular rhythm, normal heart sounds and intact distal pulses.  Pulmonary/Chest: Effort normal and breath sounds  normal. Right breast exhibits no inverted nipple, no mass, no nipple discharge, no skin change and no tenderness. Left breast exhibits no inverted nipple, no mass, no nipple discharge, no skin change and no tenderness.  Abdominal: Soft. Normal appearance and bowel sounds are normal. There is no tenderness.  Genitourinary: Vagina normal.  Genitourinary Comments: S/p hysterectomy No pain with bimanual exam.  CMA chaperone present for GU and breast exam.   Lymphadenopathy:       Head (right side): No tonsillar, no preauricular, no posterior auricular and no occipital adenopathy present.       Head (left side): No tonsillar, no preauricular, no posterior auricular and no occipital adenopathy present.    She has no cervical adenopathy.       Right: No supraclavicular adenopathy present.       Left: No supraclavicular adenopathy present.  Neurological: She is alert and oriented to person, place, and time. She has normal strength and normal reflexes.  Skin: Skin is warm and dry. Rash noted.  Erythematous macular rash within b/l inguinal folds.      Visual Acuity Screening   Right eye Left eye Both eyes  Without correction:     With correction: '20/40 20/30 20/25 '     Wt Readings from Last 3 Encounters:  02/22/18 251 lb 9.6 oz (114.1 kg)  12/17/17 256 lb (116.1 kg)  11/24/17 261 lb 6.4 oz (118.6 kg)   Results for orders placed or performed in visit on 02/22/18 (from the past 24 hour(s))  POCT urinalysis dipstick     Status: Abnormal   Collection Time: 02/22/18 12:08 PM  Result Value Ref Range   Color, UA yellow yellow   Clarity, UA cloudy (A) clear   Glucose, UA negative negative mg/dL   Bilirubin, UA negative negative   Ketones, POC UA negative negative mg/dL   Spec Grav, UA <=1.005 (A) 1.010 - 1.025   Blood, UA trace-lysed (A) negative   pH, UA 6.0 5.0 - 8.0   Protein Ur, POC negative negative mg/dL   Urobilinogen, UA 0.2 0.2 or 1.0 E.U./dL   Nitrite, UA Negative Negative    Leukocytes, UA Moderate (2+) (A) Negative  POCT glycosylated hemoglobin (Hb A1C)     Status: Abnormal   Collection Time: 02/22/18 12:24 PM  Result Value Ref Range   Hemoglobin A1C 5.7 (A) 4.0 - 5.6 %   HbA1c POC (<> result, manual entry)     HbA1c, POC (prediabetic range)     HbA1c, POC (controlled diabetic range)    POCT Microscopic Urinalysis ([facility])     Status: Abnormal   Collection Time: 02/22/18 12:42 PM  Result Value Ref Range   WBC,UR,HPF,POC Moderate (  A) None WBC/hpf   RBC,UR,HPF,POC None None RBC/hpf   Bacteria Many (A) None, Too numerous to count   Mucus Absent Absent   Epithelial Cells, UR Per Microscopy Few (A) None, Too numerous to count cells/hpf    Assessment and Plan :  Discussed healthy lifestyle, diet, exercise, preventative care, vaccinations, and addressed patient's concerns. Plan for follow up in 6 months. Otherwise, plan for specific conditions below.  1. Annual physical exam 2. Need for influenza vaccination - Flu Vaccine QUAD 36+ mos IM  3. Type 2 diabetes mellitus without complication, without long-term current use of insulin (HCC) A1c has improved from 7.4 four months ago to 5.7 today.  Patient has lost 20 pounds with lifestyle modifications over the past 4 months.  She was congratulated on her effort.  Recommend discontinuing Januvia at this time.  Decrease metformin to once daily.  Follow-up in 3 to 6 months for reevaluation. - CBC with Differential/Platelet - CMP14+EGFR - Lipid panel - TSH - Microalbumin/Creatinine Ratio, Urine - POCT glycosylated hemoglobin (Hb A1C) - HM Diabetes Foot Exam - Ambulatory referral to Ophthalmology - POCT urinalysis dipstick - Continuous Blood Gluc Sensor (FREESTYLE LIBRE 14 DAY SENSOR) MISC; Inject 1 Dose into the skin every 14 (fourteen) days.  Dispense: 6 each; Refill: 3 - losartan (COZAAR) 25 MG tablet; Take 1 tablet (25 mg total) by mouth daily.  Dispense: 90 tablet; Refill: 1 - metFORMIN (GLUCOPHAGE) 1000 MG  tablet; Take 1 tablet (1,000 mg total) by mouth daily with breakfast.  Dispense: 90 tablet; Refill: 1 - rosuvastatin (CRESTOR) 40 MG tablet; Take 1 tablet (40 mg total) by mouth daily.  Dispense: 90 tablet; Refill: 3  4. Dyslipidemia - Lipid panel - rosuvastatin (CRESTOR) 40 MG tablet; Take 1 tablet (40 mg total) by mouth daily.  Dispense: 90 tablet; Refill: 3  5. Class 3 severe obesity due to excess calories with serious comorbidity and body mass index (BMI) of 40.0 to 44.9 in adult ([place])   6. Smoker 7. Screening for breast cancer - MM Digital Screening; Future  8. Urinary frequency - POCT Microscopic Urinalysis ([facility]) - Urine Culture  9. Leukocytes in urine History, UA, and urine micro, consistent with UTI.  Will treat empirically at this time.  Urine culture pending. - nitrofurantoin, macrocrystal-monohydrate, (MACROBID) 100 MG capsule; Take 1 capsule (100 mg total) by mouth 2 (two) times daily for 5 days.  Dispense: 10 capsule; Refill: 0  10. Intertrigo Physical exam findings consistent with intertrigo.  Given Rx for topical nystatin.  May use Diflucan if use of antibiotic worsens symptoms or she develops yeast infection.  Follow-up as needed. - nystatin (MYCOSTATIN/NYSTOP) powder; Apply topically 4 (four) times daily.  Dispense: 30 g; Refill: 1 - fluconazole (DIFLUCAN) 150 MG tablet; Take 1 tablet (150 mg total) by mouth once for 1 dose.  Dispense: 1 tablet; Refill: 0   [name], PA-C  [facility] 02/22/2018 1:40 PM

## 2018-02-22 NOTE — Patient Instructions (Addendum)
Your A1C has improved so much! Go you! At this time, stop januvia, Decrease metformin to once daily. Follow up in 3-6 months for reevaluation.  Your results indicate you have a UTI. I have given you a prescription for an antibiotic. Please take with food. I have sent off a urine culture and we should have those results in 48 hours. If your symptoms worsen while you are awaiting these results or you develop fever, chills, flank pian, nausea and vomiting, please seek care immediately.   If you get a yeast infection after the antibiotic, I sent in diflucan.  For groin area, use nystatin powder if needed.      If you have lab work done today you will be contacted with your lab results within the next 2 weeks.  If you have not heard from [place] then please contact us. The fastest way to get your results is to register for My Chart.  Health Maintenance, Female Adopting a healthy lifestyle and getting preventive care can go a long way to promote health and wellness. Talk with your health care provider about what schedule of regular examinations is right for you. This is a good chance for you to check in with your provider about disease prevention and staying healthy. In between checkups, there are plenty of things you can do on your own. Experts have done a lot of research about which lifestyle changes and preventive measures are most likely to keep you healthy. Ask your health care provider for more information. Weight and diet Eat a healthy diet  Be sure to include plenty of vegetables, fruits, low-fat dairy products, and lean protein.  Do not eat a lot of foods high in solid fats, added sugars, or salt.  Get regular exercise. This is one of the most important things you can do for your health. ? Most adults should exercise for at least 150 minutes each week. The exercise should increase your heart rate and make you sweat (moderate-intensity exercise). ? Most adults should also do strengthening  exercises at least twice a week. This is in addition to the moderate-intensity exercise.  Maintain a healthy weight  Body mass index (BMI) is a measurement that can be used to identify possible weight problems. It estimates body fat based on height and weight. Your health care provider can help determine your BMI and help you achieve or maintain a healthy weight.  For females 35 years of age and older: ? A BMI below 18.5 is considered underweight. ? A BMI of 18.5 to 24.9 is normal. ? A BMI of 25 to 29.9 is considered overweight. ? A BMI of 30 and above is considered obese.  Watch levels of cholesterol and blood lipids  You should start having your blood tested for lipids and cholesterol at 56 years of age, then have this test every 5 years.  You may need to have your cholesterol levels checked more often if: ? Your lipid or cholesterol levels are high. ? You are older than 56 years of age. ? You are at high risk for heart disease.  Cancer screening Lung Cancer  Lung cancer screening is recommended for adults 28-22 years old who are at high risk for lung cancer because of a history of smoking.  A yearly low-dose CT scan of the lungs is recommended for people who: ? Currently smoke. ? Have quit within the past 15 years. ? Have at least a 30-pack-year history of smoking. A pack year is smoking an average  of one pack of cigarettes a day for 1 year.  Yearly screening should continue until it has been 15 years since you quit.  Yearly screening should stop if you develop a health problem that would prevent you from having lung cancer treatment.  Breast Cancer  Practice breast self-awareness. This means understanding how your breasts normally appear and feel.  It also means doing regular breast self-exams. Let your health care provider know about any changes, no matter how small.  If you are in your 20s or 30s, you should have a clinical breast exam (CBE) by a health care provider  every 1-3 years as part of a regular health exam.  If you are 77 or older, have a CBE every year. Also consider having a breast X-ray (mammogram) every year.  If you have a family history of breast cancer, talk to your health care provider about genetic screening.  If you are at high risk for breast cancer, talk to your health care provider about having an MRI and a mammogram every year.  Breast cancer gene (BRCA) assessment is recommended for women who have family members with BRCA-related cancers. BRCA-related cancers include: ? Breast. ? Ovarian. ? Tubal. ? Peritoneal cancers.  Results of the assessment will determine the need for genetic counseling and BRCA1 and BRCA2 testing.  Cervical Cancer Your health care provider may recommend that you be screened regularly for cancer of the pelvic organs (ovaries, uterus, and vagina). This screening involves a pelvic examination, including checking for microscopic changes to the surface of your cervix (Pap test). You may be encouraged to have this screening done every 3 years, beginning at age 57.  For women ages 52-65, health care providers may recommend pelvic exams and Pap testing every 3 years, or they may recommend the Pap and pelvic exam, combined with testing for human papilloma virus (HPV), every 5 years. Some types of HPV increase your risk of cervical cancer. Testing for HPV may also be done on women of any age with unclear Pap test results.  Other health care providers may not recommend any screening for nonpregnant women who are considered low risk for pelvic cancer and who do not have symptoms. Ask your health care provider if a screening pelvic exam is right for you.  If you have had past treatment for cervical cancer or a condition that could lead to cancer, you need Pap tests and screening for cancer for at least 20 years after your treatment. If Pap tests have been discontinued, your risk factors (such as having a new sexual  partner) need to be reassessed to determine if screening should resume. Some women have medical problems that increase the chance of getting cervical cancer. In these cases, your health care provider may recommend more frequent screening and Pap tests.  Colorectal Cancer  This type of cancer can be detected and often prevented.  Routine colorectal cancer screening usually begins at 56 years of age and continues through 56 years of age.  Your health care provider may recommend screening at an earlier age if you have risk factors for colon cancer.  Your health care provider may also recommend using home test kits to check for hidden blood in the stool.  A small camera at the end of a tube can be used to examine your colon directly (sigmoidoscopy or colonoscopy). This is done to check for the earliest forms of colorectal cancer.  Routine screening usually begins at age 56.  Direct examination of the colon  should be repeated every 5-10 years through 56 years of age. However, you may need to be screened more often if early forms of precancerous polyps or small growths are found.  Skin Cancer  Check your skin from head to toe regularly.  Tell your health care provider about any new moles or changes in moles, especially if there is a change in a mole's shape or color.  Also tell your health care provider if you have a mole that is larger than the size of a pencil eraser.  Always use sunscreen. Apply sunscreen liberally and repeatedly throughout the day.  Protect yourself by wearing long sleeves, pants, a wide-brimmed hat, and sunglasses whenever you are outside.  Heart disease, diabetes, and high blood pressure  High blood pressure causes heart disease and increases the risk of stroke. High blood pressure is more likely to develop in: ? People who have blood pressure in the high end of the normal range (130-139/85-89 mm Hg). ? People who are overweight or obese. ? People who are African  American.  If you are 52-35 years of age, have your blood pressure checked every 3-5 years. If you are 15 years of age or older, have your blood pressure checked every year. You should have your blood pressure measured twice-once when you are at a hospital or clinic, and once when you are not at a hospital or clinic. Record the average of the two measurements. To check your blood pressure when you are not at a hospital or clinic, you can use: ? An automated blood pressure machine at a pharmacy. ? A home blood pressure monitor.  If you are between 9 years and 80 years old, ask your health care provider if you should take aspirin to prevent strokes.  Have regular diabetes screenings. This involves taking a blood sample to check your fasting blood sugar level. ? If you are at a normal weight and have a low risk for diabetes, have this test once every three years after 56 years of age. ? If you are overweight and have a high risk for diabetes, consider being tested at a younger age or more often. Preventing infection Hepatitis B  If you have a higher risk for hepatitis B, you should be screened for this virus. You are considered at high risk for hepatitis B if: ? You were born in a country where hepatitis B is common. Ask your health care provider which countries are considered high risk. ? Your parents were born in a high-risk country, and you have not been immunized against hepatitis B (hepatitis B vaccine). ? You have HIV or AIDS. ? You use needles to inject street drugs. ? You live with someone who has hepatitis B. ? You have had sex with someone who has hepatitis B. ? You get hemodialysis treatment. ? You take certain medicines for conditions, including cancer, organ transplantation, and autoimmune conditions.  Hepatitis C  Blood testing is recommended for: ? Everyone born from [birth year] through 1964. ? Anyone with known risk factors for hepatitis C.  Sexually transmitted infections  (STIs)  You should be screened for sexually transmitted infections (STIs) including gonorrhea and chlamydia if: ? You are sexually active and are younger than 56 years of age. ? You are older than 56 years of age and your health care provider tells you that you are at risk for this type of infection. ? Your sexual activity has changed since you were last screened and you are at an increased  risk for chlamydia or gonorrhea. Ask your health care provider if you are at risk.  If you do not have HIV, but are at risk, it may be recommended that you take a prescription medicine daily to prevent HIV infection. This is called pre-exposure prophylaxis (PrEP). You are considered at risk if: ? You are sexually active and do not regularly use condoms or know the HIV status of your partner(s). ? You take drugs by injection. ? You are sexually active with a partner who has HIV.  Talk with your health care provider about whether you are at high risk of being infected with HIV. If you choose to begin PrEP, you should first be tested for HIV. You should then be tested every 3 months for as long as you are taking PrEP. Pregnancy  If you are premenopausal and you may become pregnant, ask your health care provider about preconception counseling.  If you may become pregnant, take 400 to 800 micrograms (mcg) of folic acid every day.  If you want to prevent pregnancy, talk to your health care provider about birth control (contraception). Osteoporosis and menopause  Osteoporosis is a disease in which the bones lose minerals and strength with aging. This can result in serious bone fractures. Your risk for osteoporosis can be identified using a bone density scan.  If you are 56 years of age or older, or if you are at risk for osteoporosis and fractures, ask your health care provider if you should be screened.  Ask your health care provider whether you should take a calcium or vitamin D supplement to lower your risk  for osteoporosis.  Menopause may have certain physical symptoms and risks.  Hormone replacement therapy may reduce some of these symptoms and risks. Talk to your health care provider about whether hormone replacement therapy is right for you. Follow these instructions at home:  Schedule regular health, dental, and eye exams.  Stay current with your immunizations.  Do not use any tobacco products including cigarettes, chewing tobacco, or electronic cigarettes.  If you are pregnant, do not drink alcohol.  If you are breastfeeding, limit how much and how often you drink alcohol.  Limit alcohol intake to no more than 1 drink per day for nonpregnant women. One drink equals 12 ounces of beer, 5 ounces of wine, or 1 ounces of hard liquor.  Do not use street drugs.  Do not share needles.  Ask your health care provider for help if you need support or information about quitting drugs.  Tell your health care provider if you often feel depressed.  Tell your health care provider if you have ever been abused or do not feel safe at home. This information is not intended to replace advice given to you by your health care provider. Make sure you discuss any questions you have with your health care provider. Document Released: 10/07/2010 Document Revised: 08/30/2015 Document Reviewed: 12/26/2014 Elsevier Interactive Patient Education  2018 [employer].  IF you received an x-ray today, you will receive an invoice from [facility]. Please contact [facility] at [phone] with questions or concerns regarding your invoice.   IF you received labwork today, you will receive an invoice from [facility]. Please contact [facility] at [phone] with questions or concerns regarding your invoice.   Our billing staff will not be able to assist you with questions regarding bills from these companies.  You will be contacted with the lab results as soon as they are available. The fastest  way  to get your results is to activate your My Chart account. Instructions are located on the last page of this paperwork. If you have not heard from [place] regarding the results in 2 weeks, please contact this office.

## 2018-02-23 LAB — LIPID PANEL
CHOL/HDL RATIO: 4.5 ratio — AB (ref 0.0–4.4)
Cholesterol, Total: 145 mg/dL (ref 100–199)
HDL: 32 mg/dL — AB (ref >39)
LDL Calculated: 79 mg/dL (ref 0–99)
Triglycerides: 169 mg/dL — ABNORMAL HIGH (ref 0–149)
VLDL Cholesterol Cal: 34 mg/dL (ref 5–40)

## 2018-02-23 LAB — CBC WITH DIFFERENTIAL/PLATELET
BASOS: 0 %
Basophils Absolute: 0 10*3/uL (ref 0.0–0.2)
EOS (ABSOLUTE): 0.1 10*3/uL (ref 0.0–0.4)
Eos: 1 %
HEMATOCRIT: 46.4 % (ref 34.0–46.6)
Hemoglobin: 15.5 g/dL (ref 11.1–15.9)
IMMATURE GRANS (ABS): 0 10*3/uL (ref 0.0–0.1)
IMMATURE GRANULOCYTES: 0 %
LYMPHS: 31 %
Lymphocytes Absolute: 2.7 10*3/uL (ref 0.7–3.1)
MCH: 30.2 pg (ref 26.6–33.0)
MCHC: 33.4 g/dL (ref 31.5–35.7)
MCV: 90 fL (ref 79–97)
Monocytes Absolute: 0.6 10*3/uL (ref 0.1–0.9)
Monocytes: 6 %
NEUTROS ABS: 5.3 10*3/uL (ref 1.4–7.0)
Neutrophils: 62 %
PLATELETS: 109 10*3/uL — AB (ref 150–450)
RBC: 5.14 x10E6/uL (ref 3.77–5.28)
RDW: 12.1 % — ABNORMAL LOW (ref 12.3–15.4)
WBC: 8.7 10*3/uL (ref 3.4–10.8)

## 2018-02-23 LAB — MICROALBUMIN / CREATININE URINE RATIO
CREATININE, UR: 35.6 mg/dL
Microalb/Creat Ratio: 10.7 mg/g creat (ref 0.0–30.0)
Microalbumin, Urine: 3.8 ug/mL

## 2018-02-23 LAB — TSH: TSH: 1.51 u[IU]/mL (ref 0.450–4.500)

## 2018-02-23 LAB — CMP14+EGFR
A/G RATIO: 1.6 (ref 1.2–2.2)
ALBUMIN: 4.1 g/dL (ref 3.5–5.5)
ALT: 26 IU/L (ref 0–32)
AST: 30 IU/L (ref 0–40)
Alkaline Phosphatase: 103 IU/L (ref 39–117)
BILIRUBIN TOTAL: 0.6 mg/dL (ref 0.0–1.2)
BUN / CREAT RATIO: 14 (ref 9–23)
BUN: 8 mg/dL (ref 6–24)
CALCIUM: 9.2 mg/dL (ref 8.7–10.2)
CHLORIDE: 99 mmol/L (ref 96–106)
CO2: 21 mmol/L (ref 20–29)
Creatinine, Ser: 0.56 mg/dL — ABNORMAL LOW (ref 0.57–1.00)
GFR, EST AFRICAN AMERICAN: 121 mL/min/{1.73_m2} (ref >59)
GFR, EST NON AFRICAN AMERICAN: 105 mL/min/{1.73_m2} (ref >59)
GLOBULIN, TOTAL: 2.5 g/dL (ref 1.5–4.5)
Glucose: 82 mg/dL (ref 65–99)
POTASSIUM: 4.1 mmol/L (ref 3.5–5.2)
SODIUM: 141 mmol/L (ref 134–144)
TOTAL PROTEIN: 6.6 g/dL (ref 6.0–8.5)

## 2018-02-25 ENCOUNTER — Encounter: Payer: Self-pay | Admitting: Physician Assistant

## 2018-02-25 LAB — URINE CULTURE

## 2018-03-01 ENCOUNTER — Telehealth: Payer: Self-pay

## 2018-03-01 NOTE — Telephone Encounter (Signed)
Copied from [place] [id number]. Topic: General - Other >> Mar 01, 2018  1:58 PM Virl Axe D wrote: Reason for CRM: Pt was referred to [facility]. Herbert Deaner stated that they called Pt to set up appt and she refused be seen there. She prefers to go to [facility]. Please advise.  Message sent to Referrals to manage

## 2018-10-12 ENCOUNTER — Other Ambulatory Visit: Payer: Self-pay | Admitting: Physician Assistant

## 2018-10-12 DIAGNOSIS — E119 Type 2 diabetes mellitus without complications: Secondary | ICD-10-CM

## 2018-12-07 ENCOUNTER — Encounter: Payer: Self-pay | Admitting: Registered Nurse

## 2018-12-07 ENCOUNTER — Ambulatory Visit (INDEPENDENT_AMBULATORY_CARE_PROVIDER_SITE_OTHER): Payer: 59 | Admitting: Registered Nurse

## 2018-12-07 ENCOUNTER — Other Ambulatory Visit: Payer: Self-pay

## 2018-12-07 VITALS — BP 148/72 | HR 91 | Temp 98.5°F | Resp 16 | Ht 66.54 in | Wt 255.0 lb

## 2018-12-07 DIAGNOSIS — Z130 Encounter for screening for diseases of the blood and blood-forming organs and certain disorders involving the immune mechanism: Secondary | ICD-10-CM

## 2018-12-07 DIAGNOSIS — Z1329 Encounter for screening for other suspected endocrine disorder: Secondary | ICD-10-CM | POA: Diagnosis not present

## 2018-12-07 DIAGNOSIS — E119 Type 2 diabetes mellitus without complications: Secondary | ICD-10-CM

## 2018-12-07 DIAGNOSIS — Z23 Encounter for immunization: Secondary | ICD-10-CM | POA: Diagnosis not present

## 2018-12-07 DIAGNOSIS — E785 Hyperlipidemia, unspecified: Secondary | ICD-10-CM

## 2018-12-07 DIAGNOSIS — Z13228 Encounter for screening for other metabolic disorders: Secondary | ICD-10-CM

## 2018-12-07 MED ORDER — LOSARTAN POTASSIUM 25 MG PO TABS: 25.0000 mg | ORAL_TABLET | Freq: Every day | ORAL | 1 refills | Status: DC

## 2018-12-07 MED ORDER — METFORMIN HCL 1000 MG PO TABS: 1000.0000 mg | ORAL_TABLET | Freq: Every day | ORAL | 1 refills | Status: DC

## 2018-12-07 MED ORDER — ROSUVASTATIN CALCIUM 40 MG PO TABS: 40.0000 mg | ORAL_TABLET | Freq: Every day | ORAL | 1 refills | Status: DC

## 2018-12-07 NOTE — Progress Notes (Signed)
Established Patient Office Visit  Subjective:  Patient ID: Molly Payne, female    DOB: 01-Jul-1960  Age: 58 y.o. MRN: [id number]  CC:  Chief Complaint  Patient presents with  . Establish Care    TOC for management of medication and Chronic Conditions   . Medication Refill    HPI Molly Payne presents for visit to establish care and medication refills.  Has a history of HTN, HLD, and T2DM, all under good control. In addition, has had seasonal allergies that she takes OTCs and ipatropium/albuterol with good effect.  HTN: Losartan 25 mg PO qd HLD: Rosuvastatin 46m PO qd  T2DM: Metformin 10022mPO qd  Needs each of these three refilled. Past labs throw good control. She anticipates that she may take a step back today, as she reports lower exercise levels and a poor diet through COVID, but is optimistic that she is reestablishing good habits.   Past Medical History:  Diagnosis Date  . Allergy   . [place]  . Hyperlipidemia     Past Surgical History:  Procedure Laterality Date  . ABDOMINAL HYSTERECTOMY    . APPENDECTOMY    . CHOLECYSTECTOMY      Family History  Problem Relation Age of Onset  . Heart disease Mother   . Heart disease Sister   . Heart disease Sister   . Heart attack Sister   . Colon cancer Neg Hx   . Colon polyps Neg Hx   . Esophageal cancer Neg Hx   . Rectal cancer Neg Hx   . Stomach cancer Neg Hx     Social History   Socioeconomic History  . Marital status: Married    Spouse name: Not on file  . Number of children: 2  . Years of education: 1269. Highest education level: Not on file  Occupational History  . Occupation: cuTherapist, artSocial Needs  . Financial resource strain: Not on file  . Food insecurity    Worry: Not on file    Inability: Not on file  . Transportation needs    Medical: Not on file    Non-medical: Not on file  Tobacco Use  . Smoking status: Current Every Day Smoker   Packs/day: 1.00    Years: 32.00    Pack years: 32.00    Types: Cigarettes  . Smokeless tobacco: Never Used  Substance and Sexual Activity  . Alcohol use: No    Alcohol/week: 0.0 standard drinks  . Drug use: No  . Sexual activity: Not Currently    Partners: Male  Lifestyle  . Physical activity    Days per week: Not on file    Minutes per session: Not on file  . Stress: Not on file  Relationships  . Social coHerbalistn phone: Not on file    Gets together: Not on file    Attends religious service: Not on file    Active member of club or organization: Not on file    Attends meetings of clubs or organizations: Not on file    Relationship status: Not on file  . Intimate partner violence    Fear of current or ex partner: Not on file    Emotionally abused: Not on file    Physically abused: Not on file    Forced sexual activity: Not on file  Other Topics Concern  . Not on file  Social History Narrative   epworth sleepiness scale =  3 (09/20/15)    Outpatient Medications Prior to Visit  Medication Sig Dispense Refill  . aspirin 81 MG tablet Take 81 mg by mouth daily.    . blood glucose meter kit and supplies KIT Dispense based on patient and insurance preference. Use up to four times daily as directed. (FOR ICD-10 E11.69). 1 each 0  . cetirizine (ZYRTEC) 10 MG tablet Take 1 tablet (10 mg total) by mouth daily. 30 tablet 11  . Continuous Blood Gluc Receiver (FREESTYLE LIBRE 14 DAY READER) DEVI 1 Units by Does not apply route daily as needed. 1 Device 0  . Continuous Blood Gluc Sensor (FREESTYLE LIBRE 14 DAY SENSOR) MISC Inject 1 Dose into the skin every 14 (fourteen) days. 6 each 3  . cyclobenzaprine (FLEXERIL) 5 MG tablet Take 1 tablet (5 mg total) by mouth 3 (three) times daily as needed for muscle spasms. 60 tablet 0  . fluticasone (FLONASE) 50 MCG/ACT nasal spray Use 2 sprays each nostril twice daily for 5 days, then decrease to once daily 16 g 1  . Guaifenesin (MUCINEX  MAXIMUM STRENGTH) 1200 MG TB12 Take 1 tablet (1,200 mg total) by mouth every 12 (twelve) hours as needed. 14 tablet 1  . ipratropium (ATROVENT) 0.03 % nasal spray Place 2 sprays into both nostrils 2 (two) times daily. 30 mL 0  . Ipratropium-Albuterol (COMBIVENT) 20-100 MCG/ACT AERS respimat Inhale 1 puff into the lungs 4 (four) times daily. 4 g 0  . lidocaine (XYLOCAINE) 2 % solution Use as directed 5 mLs in the mouth or throat as needed for mouth pain. 100 mL 0  . meloxicam (MOBIC) 7.5 MG tablet Take 1-2 tablets (7.5-15 mg total) by mouth daily as needed for pain. 30 tablet 0  . nystatin (MYCOSTATIN/NYSTOP) powder Apply topically 4 (four) times daily. 30 g 1  . losartan (COZAAR) 25 MG tablet Take 1 tablet (25 mg total) by mouth daily. 90 tablet 1  . metFORMIN (GLUCOPHAGE) 1000 MG tablet Take 1 tablet (1,000 mg total) by mouth daily with breakfast. 90 tablet 1  . rosuvastatin (CRESTOR) 40 MG tablet Take 1 tablet (40 mg total) by mouth daily. 90 tablet 3   No facility-administered medications prior to visit.     No Known Allergies  ROS Review of Systems  Constitutional: Negative.   HENT: Negative.   Eyes: Negative.   Respiratory: Negative.   Cardiovascular: Negative.   Gastrointestinal: Negative.   Endocrine: Negative.   Genitourinary: Negative.   Musculoskeletal: Negative.   Skin: Negative.   Allergic/Immunologic: Negative.   Neurological: Negative.   Hematological: Negative.   Psychiatric/Behavioral: Negative.   All other systems reviewed and are negative.     Objective:    Physical Exam  Constitutional: She is oriented to person, place, and time. She appears well-developed and well-nourished. No distress.  Cardiovascular: Normal rate and regular rhythm.  Pulmonary/Chest: Effort normal. No respiratory distress.  Neurological: She is alert and oriented to person, place, and time.  Skin: Skin is warm and dry. She is not diaphoretic.  Psychiatric: She has a normal mood and  affect. Her behavior is normal. Judgment and thought content normal.  Nursing note and vitals reviewed.   BP (!) 148/72   Pulse 91   Temp 98.5 F (36.9 C) (Oral)   Resp 16   Ht 5' 6.54" (1.69 m)   Wt 255 lb (115.7 kg)   SpO2 95%   BMI 40.50 kg/m  Wt Readings from Last 3 Encounters:  12/07/18 255 lb (115.7  kg)  02/22/18 251 lb 9.6 oz (114.1 kg)  12/17/17 256 lb (116.1 kg)     Health Maintenance Due  Topic Date Due  . MAMMOGRAM  08/09/2011  . PAP SMEAR-Modifier  08/08/2017  . OPHTHALMOLOGY EXAM  12/06/2017  . HEMOGLOBIN A1C  08/23/2018    There are no preventive care reminders to display for this patient.  Lab Results  Component Value Date   TSH 1.510 02/22/2018   Lab Results  Component Value Date   WBC 8.7 02/22/2018   HGB 15.5 02/22/2018   HCT 46.4 02/22/2018   MCV 90 02/22/2018   PLT 109 (L) 02/22/2018   Lab Results  Component Value Date   NA 141 02/22/2018   K 4.1 02/22/2018   CO2 21 02/22/2018   GLUCOSE 82 02/22/2018   BUN 8 02/22/2018   CREATININE 0.56 (L) 02/22/2018   BILITOT 0.6 02/22/2018   ALKPHOS 103 02/22/2018   AST 30 02/22/2018   ALT 26 02/22/2018   PROT 6.6 02/22/2018   ALBUMIN 4.1 02/22/2018   CALCIUM 9.2 02/22/2018   ANIONGAP 10 07/08/2015   Lab Results  Component Value Date   CHOL 145 02/22/2018   Lab Results  Component Value Date   HDL 32 (L) 02/22/2018   Lab Results  Component Value Date   LDLCALC 79 02/22/2018   Lab Results  Component Value Date   TRIG 169 (H) 02/22/2018   Lab Results  Component Value Date   CHOLHDL 4.5 (H) 02/22/2018   Lab Results  Component Value Date   HGBA1C 5.7 (A) 02/22/2018      Assessment & Plan:   Problem List Items Addressed This Visit      Other   Dyslipidemia   Relevant Medications   rosuvastatin (CRESTOR) 40 MG tablet   Other Relevant Orders   Lipid panel    Other Visit Diagnoses    Type 2 diabetes mellitus without complication, without long-term current use of insulin  (HCC)    -  Primary   Relevant Medications   losartan (COZAAR) 25 MG tablet   metFORMIN (GLUCOPHAGE) 1000 MG tablet   rosuvastatin (CRESTOR) 40 MG tablet   Other Relevant Orders   Hemoglobin A1c   Screening for endocrine, metabolic and immunity disorder       Relevant Orders   CBC with Differential/Platelet   Comprehensive metabolic panel   TSH   Flu vaccine need       Relevant Orders   Flu Vaccine QUAD 36+ mos IM (Completed)      Meds ordered this encounter  Medications  . losartan (COZAAR) 25 MG tablet    Sig: Take 1 tablet (25 mg total) by mouth daily.    Dispense:  90 tablet    Refill:  1    Order Specific Question:   Supervising Provider    Answer:   Delia Chimes A [id number]  . metFORMIN (GLUCOPHAGE) 1000 MG tablet    Sig: Take 1 tablet (1,000 mg total) by mouth daily with breakfast.    Dispense:  90 tablet    Refill:  1    Order Specific Question:   Supervising Provider    Answer:   Delia Chimes A [id number]  . rosuvastatin (CRESTOR) 40 MG tablet    Sig: Take 1 tablet (40 mg total) by mouth daily.    Dispense:  90 tablet    Refill:  1    Order Specific Question:   Supervising Provider    Answer:   Nolon Rod, ZOE  A [[id number]]    Follow-up: Return in about 6 months (around 06/06/2019) for labs and med refills.   PLAN  Refilled medications  Labs drawn, will follow up as warranted  Patient encouraged to call clinic with any questions, comments, or concerns.   Maximiano Coss, NP

## 2018-12-07 NOTE — Patient Instructions (Signed)
° ° ° °  If you have lab work done today you will be contacted with your lab results within the next 2 weeks.  If you have not heard from us then please contact us. The fastest way to get your results is to register for My Chart. ° ° °IF you received an x-ray today, you will receive an invoice from [facility]. Please contact [facility] at [phone] with questions or concerns regarding your invoice.  ° °IF you received labwork today, you will receive an invoice from [facility]. Please contact [facility] at [phone] with questions or concerns regarding your invoice.  ° °Our billing staff will not be able to assist you with questions regarding bills from these companies. ° °You will be contacted with the lab results as soon as they are available. The fastest way to get your results is to activate your My Chart account. Instructions are located on the last page of this paperwork. If you have not heard from us regarding the results in 2 weeks, please contact this office. °  ° ° ° °

## 2018-12-08 ENCOUNTER — Encounter: Payer: Self-pay | Admitting: Registered Nurse

## 2018-12-08 LAB — COMPREHENSIVE METABOLIC PANEL
ALT: 18 IU/L (ref 0–32)
AST: 25 IU/L (ref 0–40)
Albumin/Globulin Ratio: 1.6 (ref 1.2–2.2)
Albumin: 4.4 g/dL (ref 3.8–4.9)
Alkaline Phosphatase: 89 IU/L (ref 39–117)
BUN/Creatinine Ratio: 14 (ref 9–23)
BUN: 10 mg/dL (ref 6–24)
Bilirubin Total: 0.5 mg/dL (ref 0.0–1.2)
CO2: 24 mmol/L (ref 20–29)
Calcium: 9.7 mg/dL (ref 8.7–10.2)
Chloride: 103 mmol/L (ref 96–106)
Creatinine, Ser: 0.74 mg/dL (ref 0.57–1.00)
GFR calc Af Amer: 104 mL/min/{1.73_m2} (ref >59)
GFR calc non Af Amer: 90 mL/min/{1.73_m2} (ref >59)
Globulin, Total: 2.7 g/dL (ref 1.5–4.5)
Glucose: 101 mg/dL — ABNORMAL HIGH (ref 65–99)
Potassium: 4.1 mmol/L (ref 3.5–5.2)
Sodium: 140 mmol/L (ref 134–144)
Total Protein: 7.1 g/dL (ref 6.0–8.5)

## 2018-12-08 LAB — CBC WITH DIFFERENTIAL/PLATELET
Basophils Absolute: 0.1 10*3/uL (ref 0.0–0.2)
Basos: 1 %
EOS (ABSOLUTE): 0.1 10*3/uL (ref 0.0–0.4)
Eos: 1 %
Hematocrit: 51.1 % — ABNORMAL HIGH (ref 34.0–46.6)
Hemoglobin: 16.9 g/dL — ABNORMAL HIGH (ref 11.1–15.9)
Immature Grans (Abs): 0 10*3/uL (ref 0.0–0.1)
Immature Granulocytes: 0 %
Lymphocytes Absolute: 2.3 10*3/uL (ref 0.7–3.1)
Lymphs: 23 %
MCH: 31 pg (ref 26.6–33.0)
MCHC: 33.1 g/dL (ref 31.5–35.7)
MCV: 94 fL (ref 79–97)
Monocytes Absolute: 0.8 10*3/uL (ref 0.1–0.9)
Monocytes: 8 %
Neutrophils Absolute: 6.8 10*3/uL (ref 1.4–7.0)
Neutrophils: 67 %
Platelets: 113 10*3/uL — ABNORMAL LOW (ref 150–450)
RBC: 5.45 x10E6/uL — ABNORMAL HIGH (ref 3.77–5.28)
RDW: 13.2 % (ref 11.7–15.4)
WBC: 10 10*3/uL (ref 3.4–10.8)

## 2018-12-08 LAB — LIPID PANEL
Chol/HDL Ratio: 3.5 ratio (ref 0.0–4.4)
Cholesterol, Total: 128 mg/dL (ref 100–199)
HDL: 37 mg/dL — ABNORMAL LOW (ref >39)
LDL Chol Calc (NIH): 52 mg/dL (ref 0–99)
Triglycerides: 244 mg/dL — ABNORMAL HIGH (ref 0–149)
VLDL Cholesterol Cal: 39 mg/dL (ref 5–40)

## 2018-12-08 LAB — HEMOGLOBIN A1C
Est. average glucose Bld gHb Est-mCnc: 131 mg/dL
Hgb A1c MFr Bld: 6.2 % — ABNORMAL HIGH (ref 4.8–5.6)

## 2018-12-08 LAB — TSH: TSH: 1.78 u[IU]/mL (ref 0.450–4.500)

## 2018-12-22 ENCOUNTER — Ambulatory Visit: Payer: 59 | Admitting: Internal Medicine

## 2019-05-02 ENCOUNTER — Ambulatory Visit: Payer: 59 | Admitting: Internal Medicine

## 2019-05-25 ENCOUNTER — Ambulatory Visit (INDEPENDENT_AMBULATORY_CARE_PROVIDER_SITE_OTHER): Payer: 59 | Admitting: Registered Nurse

## 2019-05-25 ENCOUNTER — Encounter: Payer: Self-pay | Admitting: Registered Nurse

## 2019-05-25 ENCOUNTER — Other Ambulatory Visit: Payer: Self-pay

## 2019-05-25 VITALS — BP 162/78 | HR 88 | Temp 97.6°F | Ht 66.54 in | Wt 261.2 lb

## 2019-05-25 DIAGNOSIS — F410 Panic disorder [episodic paroxysmal anxiety] without agoraphobia: Secondary | ICD-10-CM | POA: Diagnosis not present

## 2019-05-25 DIAGNOSIS — E119 Type 2 diabetes mellitus without complications: Secondary | ICD-10-CM | POA: Diagnosis not present

## 2019-05-25 DIAGNOSIS — F411 Generalized anxiety disorder: Secondary | ICD-10-CM

## 2019-05-25 LAB — POCT GLYCOSYLATED HEMOGLOBIN (HGB A1C): Hemoglobin A1C: 6.6 % — AB (ref 4.0–5.6)

## 2019-05-25 MED ORDER — ESCITALOPRAM OXALATE 10 MG PO TABS: 10.0000 mg | ORAL_TABLET | Freq: Every day | ORAL | 0 refills | Status: DC

## 2019-05-25 MED ORDER — ALPRAZOLAM 0.25 MG PO TBDP: 0.2500 mg | ORAL_TABLET | Freq: Every evening | ORAL | 0 refills | Status: DC | PRN

## 2019-05-25 MED ORDER — ESCITALOPRAM OXALATE 5 MG PO TABS: 5.0000 mg | ORAL_TABLET | Freq: Every day | ORAL | 0 refills | Status: DC

## 2019-05-31 ENCOUNTER — Encounter: Payer: Self-pay | Admitting: Registered Nurse

## 2019-05-31 NOTE — Progress Notes (Signed)
Established Patient Office Visit  Subjective:  Patient ID: Molly Payne, female    DOB: Apr 14, 1960  Age: 59 y.o. MRN: [id number]  CC:  Chief Complaint  Patient presents with  . Panic Attack    Patient states she just keeps having panic attacks for about 3 weeks now and its starting to get worse.patient want to make sure her sugar levels are okay says she knows she gained weight. GAD7=8    HPI MICHAELEEN DOWN presents for panic attacks and t2dm check  Panic attacks: worsening with her aging mother requiring more care - she worries frequently and has found that this has led to rapid heartbeat, fast breathing, and overall concern for panic attacks. She has not had these before.   T2dm: has gained weight, concerned for rise in A1c. T2dm previously under good control with medication and lifestyle. Denies symptoms of hyperglycemia. Endorses good med compliance.  Otherwise no complaints.   Past Medical History:  Diagnosis Date  . Allergy   . Diabetes mellitus without complication ([place])   . Hyperlipidemia     Past Surgical History:  Procedure Laterality Date  . ABDOMINAL HYSTERECTOMY    . APPENDECTOMY    . CHOLECYSTECTOMY      Family History  Problem Relation Age of Onset  . Heart disease Mother   . Heart disease Sister   . Heart disease Sister   . Heart attack Sister   . Colon cancer Neg Hx   . Colon polyps Neg Hx   . Esophageal cancer Neg Hx   . Rectal cancer Neg Hx   . Stomach cancer Neg Hx     Social History   Socioeconomic History  . Marital status: Married    Spouse name: Not on file  . Number of children: 2  . Years of education: 29  . Highest education level: Not on file  Occupational History  . Occupation: customer service  Tobacco Use  . Smoking status: Current Every Day Smoker    Packs/day: 1.00    Years: 32.00    Pack years: 32.00    Types: Cigarettes  . Smokeless tobacco: Never Used  Substance and Sexual Activity  . Alcohol use: No   Alcohol/week: 0.0 standard drinks  . Drug use: No  . Sexual activity: Not Currently    Partners: Male  Other Topics Concern  . Not on file  Social History Narrative   epworth sleepiness scale = 3 (09/20/15)   Social Determinants of Health   Financial Resource Strain:   . Difficulty of Paying Living Expenses: Not on file  Food Insecurity:   . Worried About Charity fundraiser in the Last Year: Not on file  . Ran Out of Food in the Last Year: Not on file  Transportation Needs:   . Lack of Transportation (Medical): Not on file  . Lack of Transportation (Non-Medical): Not on file  Physical Activity:   . Days of Exercise per Week: Not on file  . Minutes of Exercise per Session: Not on file  Stress:   . Feeling of Stress : Not on file  Social Connections:   . Frequency of Communication with Friends and Family: Not on file  . Frequency of Social Gatherings with Friends and Family: Not on file  . Attends Religious Services: Not on file  . Active Member of Clubs or Organizations: Not on file  . Attends Archivist Meetings: Not on file  . Marital Status: Not on file  Intimate Partner Violence:   . Fear of Current or Ex-Partner: Not on file  . Emotionally Abused: Not on file  . Physically Abused: Not on file  . Sexually Abused: Not on file    Outpatient Medications Prior to Visit  Medication Sig Dispense Refill  . aspirin 81 MG tablet Take 81 mg by mouth daily.    . blood glucose meter kit and supplies KIT Dispense based on patient and insurance preference. Use up to four times daily as directed. (FOR ICD-10 E11.69). 1 each 0  . cetirizine (ZYRTEC) 10 MG tablet Take 1 tablet (10 mg total) by mouth daily. 30 tablet 11  . Continuous Blood Gluc Receiver (FREESTYLE LIBRE 14 DAY READER) DEVI 1 Units by Does not apply route daily as needed. 1 Device 0  . Continuous Blood Gluc Sensor (FREESTYLE LIBRE 14 DAY SENSOR) MISC Inject 1 Dose into the skin every 14 (fourteen) days. 6 each 3   . cyclobenzaprine (FLEXERIL) 5 MG tablet Take 1 tablet (5 mg total) by mouth 3 (three) times daily as needed for muscle spasms. 60 tablet 0  . fluticasone (FLONASE) 50 MCG/ACT nasal spray Use 2 sprays each nostril twice daily for 5 days, then decrease to once daily 16 g 1  . Guaifenesin (MUCINEX MAXIMUM STRENGTH) 1200 MG TB12 Take 1 tablet (1,200 mg total) by mouth every 12 (twelve) hours as needed. 14 tablet 1  . ipratropium (ATROVENT) 0.03 % nasal spray Place 2 sprays into both nostrils 2 (two) times daily. 30 mL 0  . Ipratropium-Albuterol (COMBIVENT) 20-100 MCG/ACT AERS respimat Inhale 1 puff into the lungs 4 (four) times daily. 4 g 0  . lidocaine (XYLOCAINE) 2 % solution Use as directed 5 mLs in the mouth or throat as needed for mouth pain. 100 mL 0  . losartan (COZAAR) 25 MG tablet Take 1 tablet (25 mg total) by mouth daily. 90 tablet 1  . meloxicam (MOBIC) 7.5 MG tablet Take 1-2 tablets (7.5-15 mg total) by mouth daily as needed for pain. 30 tablet 0  . metFORMIN (GLUCOPHAGE) 1000 MG tablet Take 1 tablet (1,000 mg total) by mouth daily with breakfast. 90 tablet 1  . nystatin (MYCOSTATIN/NYSTOP) powder Apply topically 4 (four) times daily. 30 g 1  . rosuvastatin (CRESTOR) 40 MG tablet Take 1 tablet (40 mg total) by mouth daily. 90 tablet 1   No facility-administered medications prior to visit.    No Known Allergies  ROS Review of Systems  Constitutional: Negative.   HENT: Negative.   Eyes: Negative.   Respiratory: Negative.   Cardiovascular: Negative.   Gastrointestinal: Negative.   Endocrine: Negative.   Genitourinary: Negative.   Musculoskeletal: Negative.   Skin: Negative.   Allergic/Immunologic: Negative.   Neurological: Negative.   Hematological: Negative.   Psychiatric/Behavioral: Positive for sleep disturbance. Negative for agitation, behavioral problems, confusion, decreased concentration, dysphoric mood, hallucinations, self-injury and suicidal ideas. The patient is  nervous/anxious. The patient is not hyperactive.   All other systems reviewed and are negative.     Objective:    Physical Exam  Constitutional: She is oriented to person, place, and time. She appears well-developed and well-nourished. No distress.  Cardiovascular: Normal rate and regular rhythm.  Pulmonary/Chest: Effort normal. No respiratory distress.  Neurological: She is alert and oriented to person, place, and time.  Skin: Skin is warm and dry. No rash noted. She is not diaphoretic. No erythema. No pallor.  Psychiatric: She has a normal mood and affect. Her behavior is normal. Judgment  and thought content normal.  Nursing note and vitals reviewed.   BP (!) 162/78   Pulse 88   Temp 97.6 F (36.4 C) (Temporal)   Ht 5' 6.54" (1.69 m)   Wt 261 lb 3.2 oz (118.5 kg)   SpO2 95%   BMI 41.48 kg/m  Wt Readings from Last 3 Encounters:  05/25/19 261 lb 3.2 oz (118.5 kg)  12/07/18 255 lb (115.7 kg)  02/22/18 251 lb 9.6 oz (114.1 kg)     Health Maintenance Due  Topic Date Due  . MAMMOGRAM  08/09/2011  . PAP SMEAR-Modifier  08/08/2017  . OPHTHALMOLOGY EXAM  12/06/2017  . FOOT EXAM  02/23/2019    There are no preventive care reminders to display for this patient.  Lab Results  Component Value Date   TSH 1.780 12/07/2018   Lab Results  Component Value Date   WBC 10.0 12/07/2018   HGB 16.9 (H) 12/07/2018   HCT 51.1 (H) 12/07/2018   MCV 94 12/07/2018   PLT 113 (L) 12/07/2018   Lab Results  Component Value Date   NA 140 12/07/2018   K 4.1 12/07/2018   CO2 24 12/07/2018   GLUCOSE 101 (H) 12/07/2018   BUN 10 12/07/2018   CREATININE 0.74 12/07/2018   BILITOT 0.5 12/07/2018   ALKPHOS 89 12/07/2018   AST 25 12/07/2018   ALT 18 12/07/2018   PROT 7.1 12/07/2018   ALBUMIN 4.4 12/07/2018   CALCIUM 9.7 12/07/2018   ANIONGAP 10 07/08/2015   Lab Results  Component Value Date   CHOL 128 12/07/2018   Lab Results  Component Value Date   HDL 37 (L) 12/07/2018   Lab  Results  Component Value Date   LDLCALC 52 12/07/2018   Lab Results  Component Value Date   TRIG 244 (H) 12/07/2018   Lab Results  Component Value Date   CHOLHDL 3.5 12/07/2018   Lab Results  Component Value Date   HGBA1C 6.6 (A) 05/25/2019      Assessment & Plan:   Problem List Items Addressed This Visit    None    Visit Diagnoses    Type 2 diabetes mellitus without complication, without long-term current use of insulin (HCC)    -  Primary   Relevant Orders   POCT glycosylated hemoglobin (Hb A1C) (Completed)   GAD (generalized anxiety disorder)       Relevant Medications   escitalopram (LEXAPRO) 5 MG tablet   escitalopram (LEXAPRO) 10 MG tablet   ALPRAZolam (NIRAVAM) 0.25 MG dissolvable tablet   Panic attack       Relevant Medications   escitalopram (LEXAPRO) 5 MG tablet   escitalopram (LEXAPRO) 10 MG tablet   ALPRAZolam (NIRAVAM) 0.25 MG dissolvable tablet      Meds ordered this encounter  Medications  . escitalopram (LEXAPRO) 5 MG tablet    Sig: Take 1 tablet (5 mg total) by mouth daily.    Dispense:  7 tablet    Refill:  0    Order Specific Question:   Supervising Provider    Answer:   Delia Chimes A [id number]  . escitalopram (LEXAPRO) 10 MG tablet    Sig: Take 1 tablet (10 mg total) by mouth daily.    Dispense:  60 tablet    Refill:  0    Order Specific Question:   Supervising Provider    Answer:   Delia Chimes A [id number]  . ALPRAZolam (NIRAVAM) 0.25 MG dissolvable tablet    Sig: Take 1 tablet (0.25  mg total) by mouth at bedtime as needed for anxiety.    Dispense:  30 tablet    Refill:  0    Order Specific Question:   Supervising Provider    Answer:   Forrest Moron [id number]    Follow-up: Return in about 4 weeks (around 06/22/2019) for 4-6 weeks - telemed - med check escitalopram.   PLAN  a1c shows small rise but overall continued good control of a1c. Improve lifestyle for increased control but no medication changes needed at this  time  Start lexapro 77m Po qd, after 1 week, increase to 170mPO qd  Alprazolam 0.2561mublingual PO qhs prn for sleep  Return in 4-6 weeks for med check. May be telemed/video visit if patient prefers  Patient encouraged to call clinic with any questions, comments, or concerns.  RicMaximiano CossP

## 2019-06-01 ENCOUNTER — Ambulatory Visit: Payer: Self-pay | Admitting: *Deleted

## 2019-06-01 NOTE — Progress Notes (Signed)
**Note Molly-Identified via Obfuscation** Cardiology Office Note:    Date:  06/02/2019   ID:  [name], DOB Oct 11, 1960, MRN [id number]  PCP:  Maximiano Coss, NP  Cardiologist:  Pixie Casino, MD   Referring MD: Maximiano Coss, NP   Chief Complaint  Patient presents with  . New Patient (Initial Visit)    coronary calcification    History of Present Illness:    Molly Payne is a 59 y.o. female with a hx of morbid obesity, DM, current smoker, and dyslipidemia. She established care with Dr. Debara Pickett in 2017 due to concern for CAD given her family history - sister had recently died of MI at age 61, which was unexpected. She underwent calcium score which revealed low calcium score of 7. Risk factor modification was discussed, including smoking cessation, weight loss, and LDL < 70.   She presents today for follow up. She has not been seen since 2017. She is treated as a new patient today. She is still smoking 1 ppd. She has recently started on lexapro, followed by PCP for anxiety and depression. Her brother died last year of cancer and her mother just had a PPM placed and is now caring for her. She has lost some weight on lexapro.    Past Medical History:  Diagnosis Date  . Allergy   . Diabetes mellitus without complication (Molly Payne)   . Hyperlipidemia     Past Surgical History:  Procedure Laterality Date  . ABDOMINAL HYSTERECTOMY    . APPENDECTOMY    . CHOLECYSTECTOMY      Current Medications: Current Meds  Medication Sig  . ALPRAZolam (NIRAVAM) 0.25 MG dissolvable tablet Take 1 tablet (0.25 mg total) by mouth at bedtime as needed for anxiety.  Molly Payne aspirin 81 MG tablet Take 81 mg by mouth daily.  . blood glucose meter kit and supplies KIT Dispense based on patient and insurance preference. Use up to four times daily as directed. (FOR ICD-10 E11.69).  . cetirizine (ZYRTEC) 10 MG tablet Take 1 tablet (10 mg total) by mouth daily.  . Continuous Blood Gluc Receiver (FREESTYLE LIBRE 14 DAY READER) DEVI 1 Units by Does not  apply route daily as needed.  . Continuous Blood Gluc Sensor (FREESTYLE LIBRE 14 DAY SENSOR) MISC Inject 1 Dose into the skin every 14 (fourteen) days.  . cyclobenzaprine (FLEXERIL) 5 MG tablet Take 1 tablet (5 mg total) by mouth 3 (three) times daily as needed for muscle spasms.  Molly Payne escitalopram (LEXAPRO) 10 MG tablet Take 1 tablet (10 mg total) by mouth daily.  . fluticasone (FLONASE) 50 MCG/ACT nasal spray Use 2 sprays each nostril twice daily for 5 days, then decrease to once daily  . Guaifenesin (MUCINEX MAXIMUM STRENGTH) 1200 MG TB12 Take 1 tablet (1,200 mg total) by mouth every 12 (twelve) hours as needed.  Molly Payne ipratropium (ATROVENT) 0.03 % nasal spray Place 2 sprays into both nostrils 2 (two) times daily.  . Ipratropium-Albuterol (COMBIVENT) 20-100 MCG/ACT AERS respimat Inhale 1 puff into the lungs 4 (four) times daily.  Molly Payne lidocaine (XYLOCAINE) 2 % solution Use as directed 5 mLs in the mouth or throat as needed for mouth pain.  Molly Payne losartan (COZAAR) 25 MG tablet Take 1 tablet (25 mg total) by mouth daily.  . meloxicam (MOBIC) 7.5 MG tablet Take 1-2 tablets (7.5-15 mg total) by mouth daily as needed for pain.  . metFORMIN (GLUCOPHAGE) 1000 MG tablet Take 1 tablet (1,000 mg total) by mouth daily with breakfast.  . rosuvastatin (CRESTOR) 40 MG  tablet Take 1 tablet (40 mg total) by mouth daily.     Allergies:   Patient has no known allergies.   Social History   Socioeconomic History  . Marital status: Married    Spouse name: Not on file  . Number of children: 2  . Years of education: 53  . Highest education level: Not on file  Occupational History  . Occupation: customer service  Tobacco Use  . Smoking status: Current Every Day Smoker    Packs/day: 1.00    Years: 32.00    Pack years: 32.00    Types: Cigarettes  . Smokeless tobacco: Never Used  Substance and Sexual Activity  . Alcohol use: No    Alcohol/week: 0.0 standard drinks  . Drug use: No  . Sexual activity: Not Currently     Partners: Male  Other Topics Concern  . Not on file  Social History Narrative   epworth sleepiness scale = 3 (09/20/15)   Social Determinants of Health   Financial Resource Strain:   . Difficulty of Paying Living Expenses: Not on file  Food Insecurity:   . Worried About Charity fundraiser in the Last Year: Not on file  . Ran Out of Food in the Last Year: Not on file  Transportation Needs:   . Lack of Transportation (Medical): Not on file  . Lack of Transportation (Non-Medical): Not on file  Physical Activity:   . Days of Exercise per Week: Not on file  . Minutes of Exercise per Session: Not on file  Stress:   . Feeling of Stress : Not on file  Social Connections:   . Frequency of Communication with Friends and Family: Not on file  . Frequency of Social Gatherings with Friends and Family: Not on file  . Attends Religious Services: Not on file  . Active Member of Clubs or Organizations: Not on file  . Attends Archivist Meetings: Not on file  . Marital Status: Not on file     Family History: The patient's family history includes Heart attack in her sister; Heart disease in her mother, sister, and sister. There is no history of Colon cancer, Colon polyps, Esophageal cancer, Rectal cancer, or Stomach cancer.  ROS:   Please see the history of present illness.     All other systems reviewed and are negative.  EKGs/Labs/Other Studies Reviewed:    The following studies were reviewed today:  Calcium score 2017 Coronary calcium score of 7. This was 65 percentile for age and sex matched control  EKG:  EKG is ordered today.  The ekg ordered today demonstrates sinus rhythm HR 80  Recent Labs: 12/07/2018: ALT 18; BUN 10; Creatinine, Ser 0.74; Hemoglobin 16.9; Platelets 113; Potassium 4.1; Sodium 140; TSH 1.780  Recent Lipid Panel    Component Value Date/Time   CHOL 128 12/07/2018 1539   CHOL 153 11/02/2015 0827   TRIG 244 (H) 12/07/2018 1539   TRIG 159 (H) 11/02/2015  0827   HDL 37 (L) 12/07/2018 1539   HDL 28 (L) 11/02/2015 0827   CHOLHDL 3.5 12/07/2018 1539   CHOLHDL 4.2 11/22/2015 1622   VLDL 44 (H) 11/22/2015 1622   LDLCALC 52 12/07/2018 1539   LDLCALC 93 11/02/2015 0827    Physical Exam:    VS:  BP 136/76   Pulse 80   Ht '5\' 7"'  (1.702 m)   Wt 256 lb 9.6 oz (116.4 kg)   BMI 40.19 kg/m     Wt Readings from Last  3 Encounters:  06/02/19 256 lb 9.6 oz (116.4 kg)  05/25/19 261 lb 3.2 oz (118.5 kg)  12/07/18 255 lb (115.7 kg)     GEN: obese female, in no acute distress HEENT: Normal NECK: No JVD; No carotid bruits LYMPHATICS: No lymphadenopathy CARDIAC: RRR, no murmurs, rubs, gallops RESPIRATORY:  Clear to auscultation without rales, wheezing or rhonchi  ABDOMEN: Soft, non-tender, non-distended MUSCULOSKELETAL:  No edema; No deformity  SKIN: Warm and dry NEUROLOGIC:  Alert and oriented x 3 PSYCHIATRIC:  Normal affect   ASSESSMENT:    1. Snoring   2. Daytime sleepiness   3. Morbid obesity ([place])   4. Family history of heart disease   5. Agatston coronary artery calcium score less than 100   6. Dyslipidemia   7. Diabetes mellitus type 2 in obese ([place])   8. Smoker   9. Essential hypertension    PLAN:    In order of problems listed above:  Coronary calcification Family history of CAD - calcium score 7 - no angina - continue ASA - long discussion about risk factor modification   Dyslipidemia with LDL goal < 70 - per [facility] 2019 LDL79, HDL 37, total chol 128, Triglycerides 244 - continue high intensity statin - she will have repeat lipid panel with her PCP - if not at goal, I have let her know we can see her in lipid clinic   DM - A1c 6.6%   Current smoker - I discussed that this was a major risk factor for heart disease - she has considered nicotine patch and will call the 1-800 number for patches   Snoring Daytime fatigue - she states she had a sleep study 10 years ago that was negative for OSA - given her  overall clinical picture, I recommend repeat sleep study   Hypertension - pressure is controlled on 25 mg losartan   Follow up in 1 year, sooner PRN.   Medication Adjustments/Labs and Tests Ordered: Current medicines are reviewed at length with the patient today.  Concerns regarding medicines are outlined above.  Orders Placed This Encounter  Procedures  . EKG 12-Lead  . Split night study   No orders of the defined types were placed in this encounter.   Signed, Ledora Bottcher, [place]  06/02/2019 2:39 PM    [facility]

## 2019-06-01 NOTE — Telephone Encounter (Signed)
Per initital encounter, "t states she was put on escitalopram (LEXAPRO) 5 MG tablet and is having hot flashes almost like going through menopause. Pt wants to know if this is normal and what she can do to help with this"; contacted pt regarding her symptoms; the pt states she started the Lexapro 5 mg on 05/26/19, and is to increase it to 10 mg on 06/02/19; the hot flashes started 05/31/19 intermittently throughout the day; recommendations made per nurse triage protocol; she verbalized understanding; tthe pt can be can be contacted at [phone]; the medication was prescribed by Maximiano Coss, [place]; will route to office for final disposition.  Reason for Disposition . [1] Caller has NON-URGENT medication question about med that PCP prescribed AND [2] triager unable to answer question  Answer Assessment - Initial Assessment Questions 1.   NAME of MEDICATION: "What medicine are you calling about?"     escitalopram 2.   QUESTION: "What is your question?"     Does it cause hot flashes, and what can be done about them 3.   PRESCRIBING HCP: "Who prescribed it?" Reason: if prescribed by specialist, call should be referred to that group.    Maximiano Coss, NP 4. SYMPTOMS: "Do you have any symptoms?"     Hot flashes 5. SEVERITY: If symptoms are present, ask "Are they mild, moderate or severe?"     moderate 6.  PREGNANCY:  "Is there any chance that you are pregnant?" "When was your last menstrual period?"    No, post menopausal  Protocols used: MEDICATION QUESTION CALL-A-AH

## 2019-06-01 NOTE — Telephone Encounter (Signed)
I have spoken to the provider. He stated that these side effects are normal and if it is too unbearable then she can stop the medication. For now she is scheduled to start the 10 mg tomorrow on 06/02/19. I have called and informed of provider message and that she is able to cut the 3 of the 10 mg tab in half and continue taking the 5 mg tabs for additional 6 days. Pt was in agreement with plan and I have scheduled her for a f/u appt for Wednesday, 06/22/2019 at 3:10pm.

## 2019-06-02 ENCOUNTER — Encounter: Payer: Self-pay | Admitting: Physician Assistant

## 2019-06-02 ENCOUNTER — Ambulatory Visit (INDEPENDENT_AMBULATORY_CARE_PROVIDER_SITE_OTHER): Payer: 59 | Admitting: Physician Assistant

## 2019-06-02 ENCOUNTER — Other Ambulatory Visit: Payer: Self-pay

## 2019-06-02 VITALS — BP 136/76 | HR 80 | Ht 67.0 in | Wt 256.6 lb

## 2019-06-02 DIAGNOSIS — R0683 Snoring: Secondary | ICD-10-CM | POA: Diagnosis not present

## 2019-06-02 DIAGNOSIS — I10 Essential (primary) hypertension: Secondary | ICD-10-CM

## 2019-06-02 DIAGNOSIS — R931 Abnormal findings on diagnostic imaging of heart and coronary circulation: Secondary | ICD-10-CM

## 2019-06-02 DIAGNOSIS — R400 Somnolence: Secondary | ICD-10-CM | POA: Diagnosis not present

## 2019-06-02 DIAGNOSIS — F17200 Nicotine dependence, unspecified, uncomplicated: Secondary | ICD-10-CM

## 2019-06-02 DIAGNOSIS — E669 Obesity, unspecified: Secondary | ICD-10-CM

## 2019-06-02 DIAGNOSIS — Z8249 Family history of ischemic heart disease and other diseases of the circulatory system: Secondary | ICD-10-CM | POA: Diagnosis not present

## 2019-06-02 DIAGNOSIS — E1169 Type 2 diabetes mellitus with other specified complication: Secondary | ICD-10-CM

## 2019-06-02 DIAGNOSIS — E119 Type 2 diabetes mellitus without complications: Secondary | ICD-10-CM

## 2019-06-02 DIAGNOSIS — E785 Hyperlipidemia, unspecified: Secondary | ICD-10-CM

## 2019-06-02 NOTE — Patient Instructions (Signed)
Medication Instructions:  Your physician recommends that you continue on your current medications as directed. Please refer to the Current Medication list given to you today.  *If you need a refill on your cardiac medications before your next appointment, please call your pharmacy*  Lab Work: Please have fasting Lipid done with your PCP  If you have labs (blood work) drawn today and your tests are completely normal, you will receive your results only by: Marland Kitchen MyChart Message (if you have MyChart) OR . A paper copy in the mail If you have any lab test that is abnormal or we need to change your treatment, we will call you to review the results.  Testing/Procedures: Your physician has recommended that you have a sleep study. This test records several body functions during sleep, including: brain activity, eye movement, oxygen and carbon dioxide blood levels, heart rate and rhythm, breathing rate and rhythm, the flow of air through your mouth and nose, snoring, body muscle movements, and chest and belly movement. Our sleep coordinator, Mariann Laster, CMA will call you to schedule this.   Follow-Up: At [facility], you and your health needs are our priority.  As part of our continuing mission to provide you with exceptional heart care, we have created designated Provider Care Teams.  These Care Teams include your primary Cardiologist (physician) and Advanced Practice Providers (APPs -  Physician Assistants and Nurse Practitioners) who all work together to provide you with the care you need, when you need it.  Your next appointment:   12 month(s)  The format for your next appointment:   In Person  Provider:   K. Mali Hilty, MD  Other Instructions Please call our office 2 months in advance to schedule your 12 month follow up appointment.

## 2019-06-15 ENCOUNTER — Encounter: Payer: Self-pay | Admitting: Registered Nurse

## 2019-06-15 ENCOUNTER — Ambulatory Visit (INDEPENDENT_AMBULATORY_CARE_PROVIDER_SITE_OTHER): Payer: 59 | Admitting: Registered Nurse

## 2019-06-15 ENCOUNTER — Other Ambulatory Visit: Payer: Self-pay

## 2019-06-15 VITALS — BP 126/79 | HR 82 | Temp 97.6°F | Ht 67.0 in | Wt 262.0 lb

## 2019-06-15 DIAGNOSIS — Z1329 Encounter for screening for other suspected endocrine disorder: Secondary | ICD-10-CM | POA: Diagnosis not present

## 2019-06-15 DIAGNOSIS — N3000 Acute cystitis without hematuria: Secondary | ICD-10-CM

## 2019-06-15 DIAGNOSIS — R350 Frequency of micturition: Secondary | ICD-10-CM | POA: Diagnosis not present

## 2019-06-15 DIAGNOSIS — E785 Hyperlipidemia, unspecified: Secondary | ICD-10-CM

## 2019-06-15 DIAGNOSIS — Z13228 Encounter for screening for other metabolic disorders: Secondary | ICD-10-CM

## 2019-06-15 DIAGNOSIS — Z130 Encounter for screening for diseases of the blood and blood-forming organs and certain disorders involving the immune mechanism: Secondary | ICD-10-CM

## 2019-06-15 LAB — POCT URINALYSIS DIPSTICK (MANUAL)
Nitrite, UA: NEGATIVE
Poct Bilirubin: NEGATIVE
Poct Blood: NEGATIVE
Poct Glucose: NORMAL mg/dL
Poct Ketones: NEGATIVE
Poct Urobilinogen: 1 mg/dL — AB
Spec Grav, UA: 1.02 (ref 1.010–1.025)
pH, UA: 6 (ref 5.0–8.0)

## 2019-06-15 MED ORDER — SULFAMETHOXAZOLE-TRIMETHOPRIM 800-160 MG PO TABS: 1.0000 | ORAL_TABLET | Freq: Two times a day (BID) | ORAL | 0 refills | Status: DC

## 2019-06-15 NOTE — Progress Notes (Signed)
Acute Office Visit  Subjective:    Patient ID: Molly Payne, female    DOB: December 04, 1960, 59 y.o.   MRN: [id number]  Chief Complaint  Patient presents with  . Urinary Tract Infection    patient states she has been urinating frequently but just a little bit of urine, and now she is starting to feel some pressure and itching. Per patient she has not been taking anything for it . patient unable to void right now. Also requesting labs to be done today     HPI Patient is in today for urinary frequency  Reports onset earlier this week of frequency, passing scant amounts of urine, urgency, and some dysuria. Has had UTIs before, this feels very similar. Denies NVD, chills, fever, fatigue.  Denies gross hematuria.   Otherwise, notes that she would like routine labs to be drawn - her cardiologist would like to know her lipids.  Past Medical History:  Diagnosis Date  . Allergy   . Diabetes mellitus without complication ([facility])   . Hyperlipidemia     Past Surgical History:  Procedure Laterality Date  . ABDOMINAL HYSTERECTOMY    . APPENDECTOMY    . CHOLECYSTECTOMY      Family History  Problem Relation Age of Onset  . Heart disease Mother   . Heart disease Sister   . Heart disease Sister   . Heart attack Sister   . Colon cancer Neg Hx   . Colon polyps Neg Hx   . Esophageal cancer Neg Hx   . Rectal cancer Neg Hx   . Stomach cancer Neg Hx     Social History   Socioeconomic History  . Marital status: Married    Spouse name: Not on file  . Number of children: 2  . Years of education: 39  . Highest education level: Not on file  Occupational History  . Occupation: customer service  Tobacco Use  . Smoking status: Current Every Day Smoker    Packs/day: 1.00    Years: 32.00    Pack years: 32.00    Types: Cigarettes  . Smokeless tobacco: Never Used  Substance and Sexual Activity  . Alcohol use: No    Alcohol/week: 0.0 standard drinks  . Drug use: No  . Sexual activity:  Not Currently    Partners: Male  Other Topics Concern  . Not on file  Social History Narrative   epworth sleepiness scale = 3 (09/20/15)   Social Determinants of Health   Financial Resource Strain:   . Difficulty of Paying Living Expenses: Not on file  Food Insecurity:   . Worried About Charity fundraiser in the Last Year: Not on file  . Ran Out of Food in the Last Year: Not on file  Transportation Needs:   . Lack of Transportation (Medical): Not on file  . Lack of Transportation (Non-Medical): Not on file  Physical Activity:   . Days of Exercise per Week: Not on file  . Minutes of Exercise per Session: Not on file  Stress:   . Feeling of Stress : Not on file  Social Connections:   . Frequency of Communication with Friends and Family: Not on file  . Frequency of Social Gatherings with Friends and Family: Not on file  . Attends Religious Services: Not on file  . Active Member of Clubs or Organizations: Not on file  . Attends Archivist Meetings: Not on file  . Marital Status: Not on file  Intimate Partner  Violence:   . Fear of Current or Ex-Partner: Not on file  . Emotionally Abused: Not on file  . Physically Abused: Not on file  . Sexually Abused: Not on file    Outpatient Medications Prior to Visit  Medication Sig Dispense Refill  . ALPRAZolam (NIRAVAM) 0.25 MG dissolvable tablet Take 1 tablet (0.25 mg total) by mouth at bedtime as needed for anxiety. 30 tablet 0  . aspirin 81 MG tablet Take 81 mg by mouth daily.    . blood glucose meter kit and supplies KIT Dispense based on patient and insurance preference. Use up to four times daily as directed. (FOR ICD-10 E11.69). 1 each 0  . cetirizine (ZYRTEC) 10 MG tablet Take 1 tablet (10 mg total) by mouth daily. 30 tablet 11  . Continuous Blood Gluc Receiver (FREESTYLE LIBRE 14 DAY READER) DEVI 1 Units by Does not apply route daily as needed. 1 Device 0  . Continuous Blood Gluc Sensor (FREESTYLE LIBRE 14 DAY SENSOR)  MISC Inject 1 Dose into the skin every 14 (fourteen) days. 6 each 3  . cyclobenzaprine (FLEXERIL) 5 MG tablet Take 1 tablet (5 mg total) by mouth 3 (three) times daily as needed for muscle spasms. 60 tablet 0  . escitalopram (LEXAPRO) 10 MG tablet Take 1 tablet (10 mg total) by mouth daily. 60 tablet 0  . fluticasone (FLONASE) 50 MCG/ACT nasal spray Use 2 sprays each nostril twice daily for 5 days, then decrease to once daily 16 g 1  . Guaifenesin (MUCINEX MAXIMUM STRENGTH) 1200 MG TB12 Take 1 tablet (1,200 mg total) by mouth every 12 (twelve) hours as needed. 14 tablet 1  . ipratropium (ATROVENT) 0.03 % nasal spray Place 2 sprays into both nostrils 2 (two) times daily. 30 mL 0  . Ipratropium-Albuterol (COMBIVENT) 20-100 MCG/ACT AERS respimat Inhale 1 puff into the lungs 4 (four) times daily. 4 g 0  . lidocaine (XYLOCAINE) 2 % solution Use as directed 5 mLs in the mouth or throat as needed for mouth pain. 100 mL 0  . losartan (COZAAR) 25 MG tablet Take 1 tablet (25 mg total) by mouth daily. 90 tablet 1  . meloxicam (MOBIC) 7.5 MG tablet Take 1-2 tablets (7.5-15 mg total) by mouth daily as needed for pain. 30 tablet 0  . metFORMIN (GLUCOPHAGE) 1000 MG tablet Take 1 tablet (1,000 mg total) by mouth daily with breakfast. 90 tablet 1  . rosuvastatin (CRESTOR) 40 MG tablet Take 1 tablet (40 mg total) by mouth daily. 90 tablet 1   No facility-administered medications prior to visit.    No Known Allergies  Review of Systems Per hpi      Objective:    Physical Exam Vitals and nursing note reviewed.  Constitutional:      General: She is not in acute distress.    Appearance: Normal appearance. She is obese. She is not ill-appearing, toxic-appearing or diaphoretic.  Cardiovascular:     Rate and Rhythm: Normal rate and regular rhythm.  Pulmonary:     Effort: Pulmonary effort is normal. No respiratory distress.  Skin:    Capillary Refill: Capillary refill takes less than 2 seconds.    Neurological:     General: No focal deficit present.     Mental Status: She is alert and oriented to person, place, and time. Mental status is at baseline.  Psychiatric:        Mood and Affect: Mood normal.        Behavior: Behavior normal.  Thought Content: Thought content normal.        Judgment: Judgment normal.     BP 126/79   Pulse 82   Temp 97.6 F (36.4 C) (Temporal)   Ht '5\' 7"'  (1.702 m)   Wt 262 lb (118.8 kg)   SpO2 95%   BMI 41.04 kg/m  Wt Readings from Last 3 Encounters:  06/15/19 262 lb (118.8 kg)  06/02/19 256 lb 9.6 oz (116.4 kg)  05/25/19 261 lb 3.2 oz (118.5 kg)    Health Maintenance Due  Topic Date Due  . MAMMOGRAM  08/09/2011  . PAP SMEAR-Modifier  08/08/2017  . OPHTHALMOLOGY EXAM  12/06/2017  . FOOT EXAM  02/23/2019    There are no preventive care reminders to display for this patient.   Lab Results  Component Value Date   TSH 1.780 12/07/2018   Lab Results  Component Value Date   WBC 10.0 12/07/2018   HGB 16.9 (H) 12/07/2018   HCT 51.1 (H) 12/07/2018   MCV 94 12/07/2018   PLT 113 (L) 12/07/2018   Lab Results  Component Value Date   NA 140 12/07/2018   K 4.1 12/07/2018   CO2 24 12/07/2018   GLUCOSE 101 (H) 12/07/2018   BUN 10 12/07/2018   CREATININE 0.74 12/07/2018   BILITOT 0.5 12/07/2018   ALKPHOS 89 12/07/2018   AST 25 12/07/2018   ALT 18 12/07/2018   PROT 7.1 12/07/2018   ALBUMIN 4.4 12/07/2018   CALCIUM 9.7 12/07/2018   ANIONGAP 10 07/08/2015   Lab Results  Component Value Date   CHOL 128 12/07/2018   Lab Results  Component Value Date   HDL 37 (L) 12/07/2018   Lab Results  Component Value Date   LDLCALC 52 12/07/2018   Lab Results  Component Value Date   TRIG 244 (H) 12/07/2018   Lab Results  Component Value Date   CHOLHDL 3.5 12/07/2018   Lab Results  Component Value Date   HGBA1C 6.6 (A) 05/25/2019       Assessment & Plan:   Problem List Items Addressed This Visit      Other    Dyslipidemia - Primary   Relevant Orders   Lipid panel    Other Visit Diagnoses    Urinary frequency       Relevant Orders   POCT Urinalysis Dip Manual (Completed)   Screening for endocrine, metabolic and immunity disorder       Relevant Orders   TSH   Comprehensive metabolic panel   CBC with Differential   Acute cystitis without hematuria       Relevant Medications   sulfamethoxazole-trimethoprim (BACTRIM DS) 800-160 MG tablet       Meds ordered this encounter  Medications  . sulfamethoxazole-trimethoprim (BACTRIM DS) 800-160 MG tablet    Sig: Take 1 tablet by mouth 2 (two) times daily.    Dispense:  6 tablet    Refill:  0    Order Specific Question:   Supervising Provider    Answer:   Forrest Moron [id number]   PLAN  UA dip shows indications of UTI  Will send culture d/t large leuks  Bactrim po bid for 3 days  Azo and other supportive measures discussed  Return to clinic precautions given  Patient encouraged to call clinic with any questions, comments, or concerns.   Maximiano Coss, NP

## 2019-06-15 NOTE — Patient Instructions (Signed)
° ° ° °  If you have lab work done today you will be contacted with your lab results within the next 2 weeks.  If you have not heard from us then please contact us. The fastest way to get your results is to register for My Chart. ° ° °IF you received an x-ray today, you will receive an invoice from [facility]. Please contact [facility] at [phone] with questions or concerns regarding your invoice.  ° °IF you received labwork today, you will receive an invoice from [facility]. Please contact [facility] at [phone] with questions or concerns regarding your invoice.  ° °Our billing staff will not be able to assist you with questions regarding bills from these companies. ° °You will be contacted with the lab results as soon as they are available. The fastest way to get your results is to activate your My Chart account. Instructions are located on the last page of this paperwork. If you have not heard from us regarding the results in 2 weeks, please contact this office. °  ° ° ° °

## 2019-06-16 ENCOUNTER — Encounter: Payer: Self-pay | Admitting: Registered Nurse

## 2019-06-16 LAB — CBC WITH DIFFERENTIAL/PLATELET
Basophils Absolute: 0.1 10*3/uL (ref 0.0–0.2)
Basos: 0 %
EOS (ABSOLUTE): 0.1 10*3/uL (ref 0.0–0.4)
Eos: 1 %
Hematocrit: 47.3 % — ABNORMAL HIGH (ref 34.0–46.6)
Hemoglobin: 15.9 g/dL (ref 11.1–15.9)
Immature Grans (Abs): 0 10*3/uL (ref 0.0–0.1)
Immature Granulocytes: 0 %
Lymphocytes Absolute: 2.5 10*3/uL (ref 0.7–3.1)
Lymphs: 20 %
MCH: 30.9 pg (ref 26.6–33.0)
MCHC: 33.6 g/dL (ref 31.5–35.7)
MCV: 92 fL (ref 79–97)
Monocytes Absolute: 0.9 10*3/uL (ref 0.1–0.9)
Monocytes: 7 %
Neutrophils Absolute: 8.8 10*3/uL — ABNORMAL HIGH (ref 1.4–7.0)
Neutrophils: 72 %
Platelets: 110 10*3/uL — ABNORMAL LOW (ref 150–450)
RBC: 5.15 x10E6/uL (ref 3.77–5.28)
RDW: 12.6 % (ref 11.7–15.4)
WBC: 12.4 10*3/uL — ABNORMAL HIGH (ref 3.4–10.8)

## 2019-06-16 LAB — COMPREHENSIVE METABOLIC PANEL
ALT: 21 IU/L (ref 0–32)
AST: 25 IU/L (ref 0–40)
Albumin/Globulin Ratio: 1.8 (ref 1.2–2.2)
Albumin: 4.2 g/dL (ref 3.8–4.9)
Alkaline Phosphatase: 91 IU/L (ref 39–117)
BUN/Creatinine Ratio: 16 (ref 9–23)
BUN: 11 mg/dL (ref 6–24)
Bilirubin Total: 0.5 mg/dL (ref 0.0–1.2)
CO2: 22 mmol/L (ref 20–29)
Calcium: 9.2 mg/dL (ref 8.7–10.2)
Chloride: 98 mmol/L (ref 96–106)
Creatinine, Ser: 0.7 mg/dL (ref 0.57–1.00)
GFR calc Af Amer: 111 mL/min/{1.73_m2} (ref >59)
GFR calc non Af Amer: 96 mL/min/{1.73_m2} (ref >59)
Globulin, Total: 2.4 g/dL (ref 1.5–4.5)
Glucose: 97 mg/dL (ref 65–99)
Potassium: 4.1 mmol/L (ref 3.5–5.2)
Sodium: 134 mmol/L (ref 134–144)
Total Protein: 6.6 g/dL (ref 6.0–8.5)

## 2019-06-16 LAB — LIPID PANEL
Chol/HDL Ratio: 2.9 ratio (ref 0.0–4.4)
Cholesterol, Total: 117 mg/dL (ref 100–199)
HDL: 40 mg/dL (ref >39)
LDL Chol Calc (NIH): 51 mg/dL (ref 0–99)
Triglycerides: 149 mg/dL (ref 0–149)
VLDL Cholesterol Cal: 26 mg/dL (ref 5–40)

## 2019-06-16 LAB — TSH: TSH: 1.36 u[IU]/mL (ref 0.450–4.500)

## 2019-06-17 LAB — URINE CULTURE

## 2019-06-20 ENCOUNTER — Other Ambulatory Visit: Payer: Self-pay | Admitting: Registered Nurse

## 2019-06-20 DIAGNOSIS — E119 Type 2 diabetes mellitus without complications: Secondary | ICD-10-CM

## 2019-06-20 DIAGNOSIS — E785 Hyperlipidemia, unspecified: Secondary | ICD-10-CM

## 2019-06-22 ENCOUNTER — Ambulatory Visit: Payer: Self-pay | Admitting: Registered Nurse

## 2019-07-06 ENCOUNTER — Ambulatory Visit (INDEPENDENT_AMBULATORY_CARE_PROVIDER_SITE_OTHER): Payer: 59 | Admitting: Registered Nurse

## 2019-07-06 ENCOUNTER — Other Ambulatory Visit: Payer: Self-pay

## 2019-07-06 ENCOUNTER — Encounter: Payer: Self-pay | Admitting: Registered Nurse

## 2019-07-06 VITALS — BP 125/82 | HR 78 | Temp 97.8°F | Resp 17 | Ht 67.0 in | Wt 260.4 lb

## 2019-07-06 DIAGNOSIS — F411 Generalized anxiety disorder: Secondary | ICD-10-CM

## 2019-07-06 MED ORDER — ESCITALOPRAM OXALATE 10 MG PO TABS: 10.0000 mg | ORAL_TABLET | Freq: Every day | ORAL | 3 refills | Status: DC

## 2019-07-06 NOTE — Patient Instructions (Signed)
° ° ° °  If you have lab work done today you will be contacted with your lab results within the next 2 weeks.  If you have not heard from us then please contact us. The fastest way to get your results is to register for My Chart. ° ° °IF you received an x-ray today, you will receive an invoice from [facility]. Please contact [facility] at [phone] with questions or concerns regarding your invoice.  ° °IF you received labwork today, you will receive an invoice from [facility]. Please contact [facility] at [phone] with questions or concerns regarding your invoice.  ° °Our billing staff will not be able to assist you with questions regarding bills from these companies. ° °You will be contacted with the lab results as soon as they are available. The fastest way to get your results is to activate your My Chart account. Instructions are located on the last page of this paperwork. If you have not heard from us regarding the results in 2 weeks, please contact this office. °  ° ° ° °

## 2019-07-06 NOTE — Progress Notes (Signed)
 Established Patient Office Visit  Subjective:  Patient ID: Molly Payne, female    DOB: 07/16/1960  Age: 59 y.o. MRN: [id number]  CC:  Chief Complaint  Patient presents with  . Follow-up    follow up to discuss lab work and medication. no other concerns     HPI Molly Payne presents for follow up on escitalopram 10mg po qd and questions about labs from 3w ago.  Feeling well. Bactrim seemed to be effective for UTI, no further symptoms  Has had trouble accessing MyChart and requesting to review labs.  Past Medical History:  Diagnosis Date  . Allergy   . Diabetes mellitus without complication ([facility])   . Hyperlipidemia     Past Surgical History:  Procedure Laterality Date  . ABDOMINAL HYSTERECTOMY    . APPENDECTOMY    . CHOLECYSTECTOMY      Family History  Problem Relation Age of Onset  . Heart disease Mother   . Heart disease Sister   . Heart disease Sister   . Heart attack Sister   . Colon cancer Neg Hx   . Colon polyps Neg Hx   . Esophageal cancer Neg Hx   . Rectal cancer Neg Hx   . Stomach cancer Neg Hx     Social History   Socioeconomic History  . Marital status: Married    Spouse name: Not on file  . Number of children: 2  . Years of education: 12  . Highest education level: Not on file  Occupational History  . Occupation: customer service  Tobacco Use  . Smoking status: Current Every Day Smoker    Packs/day: 1.00    Years: 32.00    Pack years: 32.00    Types: Cigarettes  . Smokeless tobacco: Never Used  Substance and Sexual Activity  . Alcohol use: No    Alcohol/week: 0.0 standard drinks  . Drug use: No  . Sexual activity: Not Currently    Partners: Male  Other Topics Concern  . Not on file  Social History Narrative   epworth sleepiness scale = 3 (09/20/15)   Social Determinants of Health   Financial Resource Strain:   . Difficulty of Paying Living Expenses:   Food Insecurity:   . Worried About Running Out of Food in the Last Year:    . Ran Out of Food in the Last Year:   Transportation Needs:   . Lack of Transportation (Medical):   . Lack of Transportation (Non-Medical):   Physical Activity:   . Days of Exercise per Week:   . Minutes of Exercise per Session:   Stress:   . Feeling of Stress :   Social Connections:   . Frequency of Communication with Friends and Family:   . Frequency of Social Gatherings with Friends and Family:   . Attends Religious Services:   . Active Member of Clubs or Organizations:   . Attends Club or Organization Meetings:   . Marital Status:   Intimate Partner Violence:   . Fear of Current or Ex-Partner:   . Emotionally Abused:   . Physically Abused:   . Sexually Abused:     Outpatient Medications Prior to Visit  Medication Sig Dispense Refill  . ALPRAZolam (NIRAVAM) 0.25 MG dissolvable tablet Take 1 tablet (0.25 mg total) by mouth at bedtime as needed for anxiety. 30 tablet 0  . aspirin 81 MG tablet Take 81 mg by mouth daily.    . blood glucose meter kit and supplies KIT Dispense   based on patient and insurance preference. Use up to four times daily as directed. (FOR ICD-10 E11.69). 1 each 0  . cetirizine (ZYRTEC) 10 MG tablet Take 1 tablet (10 mg total) by mouth daily. 30 tablet 11  . Continuous Blood Gluc Receiver (FREESTYLE LIBRE 14 DAY READER) DEVI 1 Units by Does not apply route daily as needed. 1 Device 0  . Continuous Blood Gluc Sensor (FREESTYLE LIBRE 14 DAY SENSOR) MISC Inject 1 Dose into the skin every 14 (fourteen) days. 6 each 3  . cyclobenzaprine (FLEXERIL) 5 MG tablet Take 1 tablet (5 mg total) by mouth 3 (three) times daily as needed for muscle spasms. 60 tablet 0  . escitalopram (LEXAPRO) 10 MG tablet Take 1 tablet (10 mg total) by mouth daily. 60 tablet 0  . fluticasone (FLONASE) 50 MCG/ACT nasal spray Use 2 sprays each nostril twice daily for 5 days, then decrease to once daily 16 g 1  . Guaifenesin (MUCINEX MAXIMUM STRENGTH) 1200 MG TB12 Take 1 tablet (1,200 mg total)  by mouth every 12 (twelve) hours as needed. 14 tablet 1  . ipratropium (ATROVENT) 0.03 % nasal spray Place 2 sprays into both nostrils 2 (two) times daily. 30 mL 0  . Ipratropium-Albuterol (COMBIVENT) 20-100 MCG/ACT AERS respimat Inhale 1 puff into the lungs 4 (four) times daily. 4 g 0  . lidocaine (XYLOCAINE) 2 % solution Use as directed 5 mLs in the mouth or throat as needed for mouth pain. 100 mL 0  . losartan (COZAAR) 25 MG tablet Take 1 tablet by mouth once daily 90 tablet 0  . meloxicam (MOBIC) 7.5 MG tablet Take 1-2 tablets (7.5-15 mg total) by mouth daily as needed for pain. 30 tablet 0  . metFORMIN (GLUCOPHAGE) 1000 MG tablet Take 1 tablet by mouth once daily with breakfast 90 tablet 0  . rosuvastatin (CRESTOR) 40 MG tablet Take 1 tablet by mouth once daily 90 tablet 0  . sulfamethoxazole-trimethoprim (BACTRIM DS) 800-160 MG tablet Take 1 tablet by mouth 2 (two) times daily. 6 tablet 0   No facility-administered medications prior to visit.    No Known Allergies  ROS Review of Systems  Constitutional: Negative.   HENT: Negative.   Eyes: Negative.   Respiratory: Negative.   Cardiovascular: Negative.   Gastrointestinal: Negative.   Endocrine: Negative.   Genitourinary: Negative.   Musculoskeletal: Negative.   Skin: Negative.   Allergic/Immunologic: Negative.   Neurological: Negative.   Hematological: Negative.   Psychiatric/Behavioral: Negative.   All other systems reviewed and are negative.     Objective:    Physical Exam  Constitutional: She is oriented to person, place, and time. She appears well-developed and well-nourished. No distress.  Cardiovascular: Normal rate and regular rhythm.  Pulmonary/Chest: Effort normal. No respiratory distress.  Neurological: She is alert and oriented to person, place, and time.  Skin: Skin is warm and dry. No rash noted. She is not diaphoretic. No erythema. No pallor.  Psychiatric: She has a normal mood and affect. Her behavior is  normal. Judgment and thought content normal.  Nursing note and vitals reviewed.   BP 125/82   Pulse 78   Temp 97.8 F (36.6 C) (Temporal)   Resp 17   Ht 5' 7" (1.702 m)   Wt 260 lb 6.4 oz (118.1 kg)   SpO2 95%   BMI 40.78 kg/m  Wt Readings from Last 3 Encounters:  07/06/19 260 lb 6.4 oz (118.1 kg)  06/15/19 262 lb (118.8 kg)  06/02/19 256 lb  9.6 oz (116.4 kg)     Health Maintenance Due  Topic Date Due  . FOOT EXAM  02/23/2019    There are no preventive care reminders to display for this patient.  Lab Results  Component Value Date   TSH 1.360 06/15/2019   Lab Results  Component Value Date   WBC 12.4 (H) 06/15/2019   HGB 15.9 06/15/2019   HCT 47.3 (H) 06/15/2019   MCV 92 06/15/2019   PLT 110 (L) 06/15/2019   Lab Results  Component Value Date   NA 134 06/15/2019   K 4.1 06/15/2019   CO2 22 06/15/2019   GLUCOSE 97 06/15/2019   BUN 11 06/15/2019   CREATININE 0.70 06/15/2019   BILITOT 0.5 06/15/2019   ALKPHOS 91 06/15/2019   AST 25 06/15/2019   ALT 21 06/15/2019   PROT 6.6 06/15/2019   ALBUMIN 4.2 06/15/2019   CALCIUM 9.2 06/15/2019   ANIONGAP 10 07/08/2015   Lab Results  Component Value Date   CHOL 117 06/15/2019   Lab Results  Component Value Date   HDL 40 06/15/2019   Lab Results  Component Value Date   LDLCALC 51 06/15/2019   Lab Results  Component Value Date   TRIG 149 06/15/2019   Lab Results  Component Value Date   CHOLHDL 2.9 06/15/2019   Lab Results  Component Value Date   HGBA1C 6.6 (A) 05/25/2019      Assessment & Plan:   Problem List Items Addressed This Visit    None    Visit Diagnoses    GAD (generalized anxiety disorder)    -  Primary      No orders of the defined types were placed in this encounter.   Follow-up: No follow-ups on file.   PLAN  Continue escitalopram 10mg Po qd  Labs reviewed - no concerns, urine culture showed susceptibility to bactrim  Return as scheduled for T2dm  Patient encouraged to  call clinic with any questions, comments, or concerns.   , NP 

## 2019-07-25 ENCOUNTER — Ambulatory Visit: Payer: 59 | Admitting: Internal Medicine

## 2019-08-03 ENCOUNTER — Other Ambulatory Visit: Payer: Self-pay | Admitting: Registered Nurse

## 2019-08-03 DIAGNOSIS — F411 Generalized anxiety disorder: Secondary | ICD-10-CM

## 2019-08-03 MED ORDER — ESCITALOPRAM OXALATE 10 MG PO TABS: 10.0000 mg | ORAL_TABLET | Freq: Every day | ORAL | 3 refills | Status: DC

## 2019-08-03 NOTE — Addendum Note (Signed)
Addended by: Kittie Plater, Kaloni Bisaillon HUA on: 08/03/2019 05:53 PM   Modules accepted: Orders

## 2019-08-03 NOTE — Telephone Encounter (Signed)
Patient is requesting a refill of the following medications: Requested Prescriptions   Pending Prescriptions Disp Refills  . escitalopram (LEXAPRO) 10 MG tablet [Pharmacy Med Name: Escitalopram Oxalate 10 MG Oral Tablet] 30 tablet 0    Sig: Take 1 tablet by mouth once daily    Date of patient request: 08/03/2019 Last office visit: 07/06/2019 Date of last refill:  Last refill amount: 30 tablets  Follow up time period per chart: N/A

## 2019-09-26 ENCOUNTER — Other Ambulatory Visit: Payer: Self-pay | Admitting: Registered Nurse

## 2019-09-26 DIAGNOSIS — E119 Type 2 diabetes mellitus without complications: Secondary | ICD-10-CM

## 2019-09-26 DIAGNOSIS — E785 Hyperlipidemia, unspecified: Secondary | ICD-10-CM

## 2019-12-27 ENCOUNTER — Other Ambulatory Visit: Payer: Self-pay | Admitting: Registered Nurse

## 2019-12-27 DIAGNOSIS — Z1231 Encounter for screening mammogram for malignant neoplasm of breast: Secondary | ICD-10-CM

## 2020-01-02 ENCOUNTER — Ambulatory Visit
Admission: RE | Admit: 2020-01-02 | Discharge: 2020-01-02 | Disposition: A | Discharge status: Home / Self Care | Payer: 59 | Source: Ambulatory Visit | Attending: Registered Nurse | Admitting: Registered Nurse

## 2020-01-02 ENCOUNTER — Other Ambulatory Visit: Payer: Self-pay

## 2020-01-02 DIAGNOSIS — Z1231 Encounter for screening mammogram for malignant neoplasm of breast: Secondary | ICD-10-CM

## 2020-01-09 ENCOUNTER — Other Ambulatory Visit: Payer: Self-pay | Admitting: Registered Nurse

## 2020-01-09 DIAGNOSIS — R928 Other abnormal and inconclusive findings on diagnostic imaging of breast: Secondary | ICD-10-CM

## 2020-01-23 ENCOUNTER — Ambulatory Visit
Admission: RE | Admit: 2020-01-23 | Discharge: 2020-01-23 | Disposition: A | Discharge status: Home / Self Care | Payer: 59 | Source: Ambulatory Visit | Attending: Registered Nurse | Admitting: Registered Nurse

## 2020-01-23 ENCOUNTER — Other Ambulatory Visit: Payer: Self-pay | Admitting: Registered Nurse

## 2020-01-23 ENCOUNTER — Other Ambulatory Visit: Payer: Self-pay

## 2020-01-23 DIAGNOSIS — R928 Other abnormal and inconclusive findings on diagnostic imaging of breast: Secondary | ICD-10-CM

## 2020-01-23 DIAGNOSIS — R921 Mammographic calcification found on diagnostic imaging of breast: Secondary | ICD-10-CM

## 2020-01-26 ENCOUNTER — Other Ambulatory Visit: Payer: Self-pay | Admitting: Registered Nurse

## 2020-01-26 DIAGNOSIS — E785 Hyperlipidemia, unspecified: Secondary | ICD-10-CM

## 2020-01-26 DIAGNOSIS — E119 Type 2 diabetes mellitus without complications: Secondary | ICD-10-CM

## 2020-02-03 ENCOUNTER — Ambulatory Visit
Admission: RE | Admit: 2020-02-03 | Discharge: 2020-02-03 | Disposition: A | Discharge status: Home / Self Care | Payer: 59 | Source: Ambulatory Visit | Attending: Registered Nurse | Admitting: Registered Nurse

## 2020-02-03 ENCOUNTER — Other Ambulatory Visit: Payer: Self-pay

## 2020-02-03 DIAGNOSIS — R921 Mammographic calcification found on diagnostic imaging of breast: Secondary | ICD-10-CM

## 2020-05-22 ENCOUNTER — Other Ambulatory Visit: Payer: Self-pay | Admitting: Registered Nurse

## 2020-05-22 DIAGNOSIS — E119 Type 2 diabetes mellitus without complications: Secondary | ICD-10-CM

## 2020-05-22 DIAGNOSIS — E785 Hyperlipidemia, unspecified: Secondary | ICD-10-CM

## 2020-05-22 NOTE — Telephone Encounter (Signed)
Requested medication (s) are due for refill today: no  Requested medication (s) are on the active medication list: yes  Last refill:  04/08/2020  Future visit scheduled: no  Notes to clinic: due for follow up appt    Requested Prescriptions  Pending Prescriptions Disp Refills   metFORMIN (GLUCOPHAGE) 1000 MG tablet [Pharmacy Med Name: metFORMIN HCl 1000 MG Oral Tablet] 90 tablet 0    Sig: Take 1 tablet by mouth once daily with breakfast      Endocrinology:  Diabetes - Biguanides Failed - 05/22/2020  9:27 AM      Failed - HBA1C is between 0 and 7.9 and within 180 days    Hemoglobin A1C  Date Value Ref Range Status  05/25/2019 6.6 (A) 4.0 - 5.6 % Final   Hgb A1c MFr Bld  Date Value Ref Range Status  12/07/2018 6.2 (H) 4.8 - 5.6 % Final    Comment:             Prediabetes: 5.7 - 6.4          Diabetes: >6.4          Glycemic control for adults with diabetes: <7.0           Failed - Valid encounter within last 6 months    Recent Outpatient Visits           10 months ago GAD (generalized anxiety disorder)   [facility], [place], NP   11 months ago Acute cystitis without hematuria   [facility], [place], NP   12 months ago Type 2 diabetes mellitus without complication, without long-term current use of insulin ([place])   [facility], [place], NP   1 year ago Type 2 diabetes mellitus without complication, without long-term current use of insulin [facility])   [facility], Delfino Lovett, NP   2 years ago Annual physical exam   [facility] [place], [place] D, PA-C                Passed - Cr in normal range and within 360 days    Creat  Date Value Ref Range Status  11/22/2015 0.60 0.50 - 1.05 mg/dL Final    Comment:      For patients > or = 59 years of age: The upper reference limit for Creatinine is approximately 13% higher for people identified as African-American.      Creatinine, Ser  Date  Value Ref Range Status  06/15/2019 0.70 0.57 - 1.00 mg/dL Final          Passed - eGFR in normal range and within 360 days    GFR, Est African American  Date Value Ref Range Status  06/20/2015 >89 >=60 mL/min Final   GFR calc Af Amer  Date Value Ref Range Status  06/15/2019 111 >59 mL/min/1.73 Final   GFR, Est Non African American  Date Value Ref Range Status  06/20/2015 >89 >=60 mL/min Final    Comment:      The estimated GFR is a calculation valid for adults (>=67 years old) that uses the CKD-EPI algorithm to adjust for age and sex. It is   not to be used for children, pregnant women, hospitalized patients,    patients on dialysis, or with rapidly changing kidney function. According to the NKDEP, eGFR >89 is normal, 60-89 shows mild impairment, 30-59 shows moderate impairment, 15-29 shows severe impairment and <15 is ESRD.  GFR calc non Af Amer  Date Value Ref Range Status  06/15/2019 96 >59 mL/min/1.73 Final            losartan (COZAAR) 25 MG tablet [Pharmacy Med Name: Losartan Potassium 25 MG Oral Tablet] 90 tablet 0    Sig: Take 1 tablet by mouth once daily      Cardiovascular:  Angiotensin Receptor Blockers Failed - 05/22/2020  9:27 AM      Failed - Cr in normal range and within 180 days    Creat  Date Value Ref Range Status  11/22/2015 0.60 0.50 - 1.05 mg/dL Final    Comment:      For patients > or = 59 years of age: The upper reference limit for Creatinine is approximately 13% higher for people identified as African-American.      Creatinine, Ser  Date Value Ref Range Status  06/15/2019 0.70 0.57 - 1.00 mg/dL Final          Failed - K in normal range and within 180 days    Potassium  Date Value Ref Range Status  06/15/2019 4.1 3.5 - 5.2 mmol/L Final          Failed - Valid encounter within last 6 months    Recent Outpatient Visits           10 months ago GAD (generalized anxiety disorder)   [facility] [place], NP    11 months ago Acute cystitis without hematuria   [facility], Delfino Lovett, NP   12 months ago Type 2 diabetes mellitus without complication, without long-term current use of insulin ([place])   [facility], Delfino Lovett, NP   1 year ago Type 2 diabetes mellitus without complication, without long-term current use of insulin ([place])   [facility], Delfino Lovett, NP   2 years ago Annual physical exam   [facility] [place], [place] D, PA-C                Passed - Patient is not pregnant      Passed - Last BP in normal range    BP Readings from Last 1 Encounters:  07/06/19 125/82            rosuvastatin (CRESTOR) 40 MG tablet [Pharmacy Med Name: Rosuvastatin Calcium 40 MG Oral Tablet] 90 tablet 0    Sig: Take 1 tablet by mouth once daily      Cardiovascular:  Antilipid - Statins Failed - 05/22/2020  9:27 AM      Failed - LDL in normal range and within 360 days    LDL, Calculated  Date Value Ref Range Status  11/02/2015 93 mg/dL Final    Comment:                                                    Reference Range:                                                 <130     (DESIRABLE)  130-159  (BORDERLINE)                                                 >=160    (HIGH)   Desirable range <100 mg/dL for patients with CHD or diabetes and <70 mg/dL for diabetic patients with known heart disease.    LDL Chol Calc (NIH)  Date Value Ref Range Status  06/15/2019 51 0 - 99 mg/dL Final          Passed - Total Cholesterol in normal range and within 360 days    Cholesterol, Total  Date Value Ref Range Status  06/15/2019 117 100 - 199 mg/dL Final  11/02/2015 153 125 - 200 mg/dL Final    Comment:      Adult Reference Ranges for Cholesterol, Total:**   > or = 20 Years:  125-200 mg/dL    <200    (Desirable)  200-239 (Borderline)  >=240   (Higher Risk)   References:   ** An executive  summary of the NCEP guidelines, the "Third Report of the NCEP Expert Panel on Detection, Evaluation, and Treatment of High Blood Cholesterol in Adults." Journal of the [place]. Aug 21, 1999.           Passed - HDL in normal range and within 360 days    HDL Cholesterol  Date Value Ref Range Status  11/02/2015 28 (L) >=46 mg/dL Final   HDL  Date Value Ref Range Status  06/15/2019 40 >39 mg/dL Final          Passed - Triglycerides in normal range and within 360 days    Triglycerides  Date Value Ref Range Status  06/15/2019 149 0 - 149 mg/dL Final  11/02/2015 159 (H) mg/dL Final    Comment:      Adult Reference Ranges for Triglycerides**:      <150 mg/dL     (Normal)    150-199 mg/dL  (Borderline High)    200-499 mg/dL  (High)    >=500 mg/dL    (Very High)   References:   ** An executive summary of the NCEP guidelines, the "Third Report of the NCEP Expert Panel on Detection, Evaluation, and Treatment of High Blood Cholesterol in Adults." Journal of the American Medical Association. Aug 21, 1999.           Passed - Patient is not pregnant      Passed - Valid encounter within last 12 months    Recent Outpatient Visits           10 months ago GAD (generalized anxiety disorder)   [facility], [place], NP   11 months ago Acute cystitis without hematuria   [facility], [place], NP   12 months ago Type 2 diabetes mellitus without complication, without long-term current use of insulin [facility])   [facility] [place], NP   1 year ago Type 2 diabetes mellitus without complication, without long-term current use of insulin [facility])   [facility], Delfino Lovett, NP   2 years ago Annual physical exam   [facility], [place] D, PA-C

## 2020-05-23 ENCOUNTER — Encounter: Payer: Self-pay | Admitting: Gastroenterology

## 2020-07-03 ENCOUNTER — Encounter: Payer: Self-pay | Admitting: Nurse Practitioner

## 2020-07-03 ENCOUNTER — Other Ambulatory Visit: Payer: Self-pay

## 2020-07-03 ENCOUNTER — Ambulatory Visit (INDEPENDENT_AMBULATORY_CARE_PROVIDER_SITE_OTHER): Payer: 59 | Admitting: Nurse Practitioner

## 2020-07-03 VITALS — BP 147/73 | HR 85 | Temp 99.3°F | Ht 68.0 in | Wt 276.9 lb

## 2020-07-03 DIAGNOSIS — Z7689 Persons encountering health services in other specified circumstances: Secondary | ICD-10-CM

## 2020-07-03 DIAGNOSIS — F17200 Nicotine dependence, unspecified, uncomplicated: Secondary | ICD-10-CM

## 2020-07-03 DIAGNOSIS — K029 Dental caries, unspecified: Secondary | ICD-10-CM

## 2020-07-03 DIAGNOSIS — E785 Hyperlipidemia, unspecified: Secondary | ICD-10-CM

## 2020-07-03 DIAGNOSIS — E119 Type 2 diabetes mellitus without complications: Secondary | ICD-10-CM

## 2020-07-03 DIAGNOSIS — F411 Generalized anxiety disorder: Secondary | ICD-10-CM

## 2020-07-03 DIAGNOSIS — I10 Essential (primary) hypertension: Secondary | ICD-10-CM | POA: Diagnosis not present

## 2020-07-03 LAB — POCT GLYCOSYLATED HEMOGLOBIN (HGB A1C): Hemoglobin A1C: 7 % — AB (ref 4.0–5.6)

## 2020-07-03 MED ORDER — ESCITALOPRAM OXALATE 10 MG PO TABS: 10.0000 mg | ORAL_TABLET | Freq: Every day | ORAL | 3 refills | Status: DC

## 2020-07-03 MED ORDER — METFORMIN HCL 1000 MG PO TABS: 1000.0000 mg | ORAL_TABLET | Freq: Every day | ORAL | 3 refills | Status: DC

## 2020-07-03 MED ORDER — CHLORHEXIDINE GLUCONATE 0.12 % MT SOLN: 15.0000 mL | Freq: Two times a day (BID) | OROMUCOSAL | 1 refills | Status: DC

## 2020-07-03 MED ORDER — ROSUVASTATIN CALCIUM 40 MG PO TABS: 40.0000 mg | ORAL_TABLET | Freq: Every day | ORAL | 3 refills | Status: DC

## 2020-07-03 MED ORDER — LOSARTAN POTASSIUM 25 MG PO TABS: 25.0000 mg | ORAL_TABLET | Freq: Every day | ORAL | 3 refills | Status: DC

## 2020-07-03 NOTE — Progress Notes (Signed)
New Patient Office Visit  Subjective:  Patient ID: Molly Payne, female    DOB: 1960-06-16  Age: 60 y.o. MRN: [id number]  CC:  Chief Complaint  Patient presents with  . New Patient (Initial Visit)    HPI Molly Payne presents to establish new primary care provider. She is diabetic. Today, her HgbA1c is 7.0. she states this is well managed. She currently takes metformin and is tolerating her dosing well. Her BP is mildly elevated today. She states that her blood pressure is generally elevated while at the doctor's office. She has had diabetic eye exam this year. She went to [facility] in 04/2020. She reports that she did get new glasses and no diabetic retinopathy was reported to her. 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 She should be following up yearly for screening mammograms. She states that she is currently trying to find a dentist.  She does have several issues with her teeth. One of the caps of her front teeth broke recently. She states that she has several other places in her mouth which bother her. She has reached out to a few different, local dentists. And is waiting a call back.  She states she was recently started on lexapro per her recent PCP. She started having anxiety since her mother got ill and had to have a pacemaker placed. The patient is now caring for her mother in the home. A PRN order for alprazolam was also given, but patient states that she has taken this medication twice. Has not needed it since lexapro has become therapeutic.  The patient states that she is a smoker. She smokes about 1 pack of cigarettes per day and has for some time.  She denies chest pain, chest pressure, shortness of breath, or headaches. She will be due to have fasting blood work done and routine physical in few months.  Past Medical History:  Diagnosis Date  . Allergy   . Diabetes mellitus without complication ([place])   . Hyperlipidemia     Past  Surgical History:  Procedure Laterality Date  . ABDOMINAL HYSTERECTOMY    . APPENDECTOMY    . CHOLECYSTECTOMY      Family History  Problem Relation Age of Onset  . Heart disease Mother   . Breast cancer Mother   . Heart disease Sister   . Heart disease Sister   . Heart attack Sister   . Colon cancer Neg Hx   . Colon polyps Neg Hx   . Esophageal cancer Neg Hx   . Rectal cancer Neg Hx   . Stomach cancer Neg Hx     Social History   Socioeconomic History  . Marital status: Married    Spouse name: Not on file  . Number of children: 2  . Years of education: 63  . Highest education level: Not on file  Occupational History  . Occupation: customer service  Tobacco Use  . Smoking status: Current Every Day Smoker    Packs/day: 1.00    Years: 32.00    Pack years: 32.00    Types: Cigarettes  . Smokeless tobacco: Never Used  Vaping Use  . Vaping Use: Never used  Substance and Sexual Activity  . Alcohol use: No    Alcohol/week: 0.0 standard drinks  . Drug use: No  . Sexual activity: Not Currently    Partners: Male  Other Topics Concern  . Not on file  Social History Narrative  epworth sleepiness scale = 3 (09/20/15)   Social Determinants of Health   Financial Resource Strain: Not on file  Food Insecurity: Not on file  Transportation Needs: Not on file  Physical Activity: Not on file  Stress: Not on file  Social Connections: Not on file  Intimate Partner Violence: Not on file    ROS Review of Systems  Constitutional: Negative for activity change, chills and fever.  HENT: Negative for congestion, postnasal drip, sinus pressure and sinus pain.   Eyes: Negative.   Respiratory: Negative for cough, chest tightness, shortness of breath and wheezing.   Cardiovascular: Negative for chest pain and palpitations.       Blood pressure mildly elevated today.   Gastrointestinal: Negative for constipation, diarrhea, nausea and vomiting.  Endocrine: Negative.    Musculoskeletal: Negative.  Negative for back pain and myalgias.  Skin: Negative.  Negative for rash.  Allergic/Immunologic: Negative for environmental allergies.  Neurological: Negative for dizziness, weakness and headaches.  Hematological: Negative.   Psychiatric/Behavioral: The patient is nervous/anxious.        Well managed on current dose lexapro.   All other systems reviewed and are negative.   Objective:   Today's Vitals   07/03/20 1425 07/03/20 1459  BP: (!) 163/73 (!) 147/73  Pulse: 85 85  Temp: 99.3 F (37.4 C)   SpO2: 96%   Weight: 276 lb 14.4 oz (125.6 kg)   Height: _0  (1.727 m)    Body mass index is 42.1 kg/m.   Physical Exam Vitals and nursing note reviewed.  Constitutional:      Appearance: Normal appearance. She is well-developed.  HENT:     Head: Normocephalic and atraumatic.     Right Ear: Ear canal and external ear normal.     Left Ear: Ear canal and external ear normal.     Mouth/Throat:     Mouth: Mucous membranes are moist.     Dentition: Gingival swelling, dental caries and gum lesions present.     Comments: Multiple dental caries with erythema of the gums.  Eyes:     Conjunctiva/sclera: Conjunctivae normal.     Pupils: Pupils are equal, round, and reactive to light.  Neck:     Vascular: No carotid bruit.  Cardiovascular:     Rate and Rhythm: Normal rate and regular rhythm.     Pulses: Normal pulses.     Heart sounds: Normal heart sounds.  Pulmonary:     Effort: Pulmonary effort is normal.     Breath sounds: Normal breath sounds.  Abdominal:     Palpations: Abdomen is soft.  Musculoskeletal:        General: Normal range of motion.     Cervical back: Normal range of motion and neck supple.  Skin:    General: Skin is warm and dry.     Capillary Refill: Capillary refill takes less than 2 seconds.  Neurological:     General: No focal deficit present.     Mental Status: She is alert and oriented to person, place, and time.   Psychiatric:        Mood and Affect: Mood normal.        Behavior: Behavior normal.        Thought Content: Thought content normal.        Judgment: Judgment normal.     Assessment & Plan:  1. Encounter to establish care Appointment today to establish new primary care provider.   2. Type 2 diabetes mellitus without complication,  without long-term current use of insulin (HCC) HgbA1c 7.0 today. Tolerating medication well. Continue as prescribed. Monitor blood sugars closely. Recheck HgbA1c at next visit  - POCT glycosylated hemoglobin (Hb A1C) - losartan (COZAAR) 25 MG tablet; Take 1 tablet (25 mg total) by mouth daily.  Dispense: 90 tablet; Refill: 3 - metFORMIN (GLUCOPHAGE) 1000 MG tablet; Take 1 tablet (1,000 mg total) by mouth daily with breakfast.  Dispense: 90 tablet; Refill: 3 - rosuvastatin (CRESTOR) 40 MG tablet; Take 1 tablet (40 mg total) by mouth daily.  Dispense: 90 tablet; Refill: 3  3. Dental caries Prescription for chlorhexidine mouth wash. Swish and spit with 10 to 15 mls twice daily. Recommend she follow up with dentist as soon as possible.  - chlorhexidine (PERIDEX) 0.12 % solution; Use as directed 15 mLs in the mouth or throat 2 (two) times daily.  Dispense: 120 mL; Refill: 1  4. Essential hypertension Stable. Continue losartan as prescribed  - losartan (COZAAR) 25 MG tablet; Take 1 tablet (25 mg total) by mouth daily.  Dispense: 90 tablet; Refill: 3  5. Dyslipidemia Check fasting lipids prior to next visit and adjust crestor dosing as indicated  - rosuvastatin (CRESTOR) 40 MG tablet; Take 1 tablet (40 mg total) by mouth daily.  Dispense: 90 tablet; Refill: 3  6. GAD (generalized anxiety disorder) Well managed on current dose lexapro. Refilled today. Patient states she does not need new rx for alprazolam as she has only had to take this twice.  - escitalopram (LEXAPRO) 10 MG tablet; Take 1 tablet (10 mg total) by mouth daily.  Dispense: 90 tablet; Refill: 3  7.  Smoker Smokes about 1 pack of cigarettes per day. Not ready to quit. Aware of risks factors associated with long term smoking.   Problem List Items Addressed This Visit      Cardiovascular and Mediastinum   Essential hypertension   Relevant Medications   losartan (COZAAR) 25 MG tablet   rosuvastatin (CRESTOR) 40 MG tablet     Digestive   Dental caries   Relevant Medications   chlorhexidine (PERIDEX) 0.12 % solution     Endocrine   Type 2 diabetes mellitus without complication, without long-term current use of insulin (HCC)   Relevant Medications   losartan (COZAAR) 25 MG tablet   metFORMIN (GLUCOPHAGE) 1000 MG tablet   rosuvastatin (CRESTOR) 40 MG tablet   Other Relevant Orders   POCT glycosylated hemoglobin (Hb A1C) (Completed)     Other   Dyslipidemia   Relevant Medications   rosuvastatin (CRESTOR) 40 MG tablet   Smoker   Encounter to establish care - Primary   GAD (generalized anxiety disorder)   Relevant Medications   escitalopram (LEXAPRO) 10 MG tablet      Outpatient Encounter Medications as of 07/03/2020  Medication Sig  . ALPRAZolam (NIRAVAM) 0.25 MG dissolvable tablet Take 1 tablet (0.25 mg total) by mouth at bedtime as needed for anxiety.  Marland Kitchen aspirin 81 MG tablet Take 81 mg by mouth daily.  . blood glucose meter kit and supplies KIT Dispense based on patient and insurance preference. Use up to four times daily as directed. (FOR ICD-10 E11.69).  . chlorhexidine (PERIDEX) 0.12 % solution Use as directed 15 mLs in the mouth or throat 2 (two) times daily.  . fluticasone (FLONASE) 50 MCG/ACT nasal spray Use 2 sprays each nostril twice daily for 5 days, then decrease to once daily  . Guaifenesin (MUCINEX MAXIMUM STRENGTH) 1200 MG TB12 Take 1 tablet (1,200 mg total) by  mouth every 12 (twelve) hours as needed.  Marland Kitchen ipratropium (ATROVENT) 0.03 % nasal spray Place 2 sprays into both nostrils 2 (two) times daily.  . Ipratropium-Albuterol (COMBIVENT) 20-100 MCG/ACT AERS  respimat Inhale 1 puff into the lungs 4 (four) times daily.  Marland Kitchen lidocaine (XYLOCAINE) 2 % solution Use as directed 5 mLs in the mouth or throat as needed for mouth pain.  . meloxicam (MOBIC) 7.5 MG tablet Take 1-2 tablets (7.5-15 mg total) by mouth daily as needed for pain.  . [DISCONTINUED] escitalopram (LEXAPRO) 10 MG tablet Take 1 tablet (10 mg total) by mouth daily.  . [DISCONTINUED] losartan (COZAAR) 25 MG tablet Take 1 tablet by mouth once daily  . [DISCONTINUED] metFORMIN (GLUCOPHAGE) 1000 MG tablet Take 1 tablet by mouth once daily with breakfast  . [DISCONTINUED] rosuvastatin (CRESTOR) 40 MG tablet Take 1 tablet by mouth once daily  . [DISCONTINUED] sulfamethoxazole-trimethoprim (BACTRIM DS) 800-160 MG tablet Take 1 tablet by mouth 2 (two) times daily.  . cetirizine (ZYRTEC) 10 MG tablet Take 1 tablet (10 mg total) by mouth daily.  Marland Kitchen escitalopram (LEXAPRO) 10 MG tablet Take 1 tablet (10 mg total) by mouth daily.  Marland Kitchen losartan (COZAAR) 25 MG tablet Take 1 tablet (25 mg total) by mouth daily.  . metFORMIN (GLUCOPHAGE) 1000 MG tablet Take 1 tablet (1,000 mg total) by mouth daily with breakfast.  . rosuvastatin (CRESTOR) 40 MG tablet Take 1 tablet (40 mg total) by mouth daily.  . [DISCONTINUED] Continuous Blood Gluc Receiver (FREESTYLE LIBRE 14 DAY READER) DEVI 1 Units by Does not apply route daily as needed.  . [DISCONTINUED] Continuous Blood Gluc Sensor (FREESTYLE LIBRE 14 DAY SENSOR) MISC Inject 1 Dose into the skin every 14 (fourteen) days.  . [DISCONTINUED] cyclobenzaprine (FLEXERIL) 5 MG tablet Take 1 tablet (5 mg total) by mouth 3 (three) times daily as needed for muscle spasms.   No facility-administered encounter medications on file as of 07/03/2020.   Time spent with the patient was approximately 45 minutes. This time included reviewing progress notes, labs, imaging studies, and discussing plan for follow up.   Follow-up: Return in about 3 months (around 10/03/2020) for health  maintenance exam, with pap - fbw and urine micro a week prior - needs 40 min.   Ronnell Freshwater, NP

## 2020-07-03 NOTE — Patient Instructions (Signed)
Dental Caries, Adult  Dental caries are spots of decay (cavities) in teeth. They are in the outer layer of your tooth (enamel). Treat them as soon as you can. If they are not treated, they can spread decay and lead to painful infection. What are the causes? This condition is caused by acid that is produced when bacteria in your mouth break down sugary foods and liquids. What increases the risk? This condition is more likely to develop in people who:  Drink a lot of sugary liquids.  Eat a lot of sweets and carbohydrates.  Drink water that does not have fluoride.  Do not clean their teeth regularly.  Take medicines that decrease saliva. What are the signs or symptoms? Symptoms of this condition include:  White, brown, or black spots on the teeth.  Pain as the decay goes deeper into the tooth.  Swelling or bleeding in the gums. How is this treated? This condition is treated with a procedure to remove decay and to restore the tooth. Follow these instructions at home:  Take good care of your mouth and teeth. This keeps them healthy. ? Brush your teeth 2 times a day. Use toothpaste with fluoride in it. ? Floss your teeth once a day.  If your dentist prescribed an antibiotic medicine, take it as told. Do not stop taking the antibiotic even if your condition gets better.  Keep all follow-up visits as told by your dentist. This is important. This includes all cleanings. How is this prevented?  To prevent dental caries: ? Brush your teeth every morning and night. Use fluoride toothpaste. ? Floss your teeth once a day. ? Get regular dental cleanings. ? If told by your dentist:  Wash your mouth with prescription mouthwash (chlorhexidine).  Put topical fluoride on your teeth. ? Drink water with fluoride in it. ? Drink water instead of sugary drinks. ? Eat healthy meals and snacks. ? Have fluoride treatments in the dentist's office and sealants put on your teeth, if told by your  dentist. Contact a doctor if:  You have symptoms of tooth decay. Summary  Dental caries are spots of decay (cavities) in teeth. It is important to treat this as soon as you see them.  This condition is caused by an acid that comes when sugar breaks down in your mouth.  To prevent this condition, brush your teeth often and have regular dental cleanings.  Take an antibiotic to treat an infection, if told by your dentist. Do not stop taking the antibiotic even if your condition gets better.  Have regular dental cleanings and keep all follow-up visits. This information is not intended to replace advice given to you by your health care provider. Make sure you discuss any questions you have with your health care provider. Document Revised: 03/11/2019 Document Reviewed: 03/11/2019 Elsevier Patient Education  [place].

## 2020-07-09 DIAGNOSIS — K029 Dental caries, unspecified: Secondary | ICD-10-CM | POA: Insufficient documentation

## 2020-07-09 DIAGNOSIS — F411 Generalized anxiety disorder: Secondary | ICD-10-CM | POA: Insufficient documentation

## 2020-07-09 DIAGNOSIS — E1165 Type 2 diabetes mellitus with hyperglycemia: Secondary | ICD-10-CM | POA: Insufficient documentation

## 2020-07-09 DIAGNOSIS — I10 Essential (primary) hypertension: Secondary | ICD-10-CM | POA: Insufficient documentation

## 2020-07-09 DIAGNOSIS — E1159 Type 2 diabetes mellitus with other circulatory complications: Secondary | ICD-10-CM | POA: Insufficient documentation

## 2020-07-09 DIAGNOSIS — E119 Type 2 diabetes mellitus without complications: Secondary | ICD-10-CM | POA: Insufficient documentation

## 2020-07-09 DIAGNOSIS — Z7689 Persons encountering health services in other specified circumstances: Secondary | ICD-10-CM | POA: Insufficient documentation

## 2020-09-14 ENCOUNTER — Other Ambulatory Visit: Payer: Self-pay | Admitting: Registered Nurse

## 2020-09-14 DIAGNOSIS — I10 Essential (primary) hypertension: Secondary | ICD-10-CM

## 2020-09-14 DIAGNOSIS — E119 Type 2 diabetes mellitus without complications: Secondary | ICD-10-CM

## 2020-10-02 ENCOUNTER — Other Ambulatory Visit: Payer: 59

## 2020-10-04 ENCOUNTER — Encounter: Payer: 59 | Admitting: Nurse Practitioner

## 2020-10-05 ENCOUNTER — Other Ambulatory Visit: Payer: 59

## 2020-10-05 ENCOUNTER — Other Ambulatory Visit: Payer: Self-pay

## 2020-10-05 DIAGNOSIS — E119 Type 2 diabetes mellitus without complications: Secondary | ICD-10-CM

## 2020-10-05 DIAGNOSIS — Z0000 Encounter for general adult medical examination without abnormal findings: Secondary | ICD-10-CM

## 2020-10-06 LAB — LIPID PANEL
Chol/HDL Ratio: 5.4 ratio — ABNORMAL HIGH (ref 0.0–4.4)
Cholesterol, Total: 178 mg/dL (ref 100–199)
HDL: 33 mg/dL — ABNORMAL LOW (ref >39)
LDL Chol Calc (NIH): 108 mg/dL — ABNORMAL HIGH (ref 0–99)
Triglycerides: 210 mg/dL — ABNORMAL HIGH (ref 0–149)
VLDL Cholesterol Cal: 37 mg/dL (ref 5–40)

## 2020-10-06 LAB — COMPREHENSIVE METABOLIC PANEL
ALT: 24 IU/L (ref 0–32)
AST: 36 IU/L (ref 0–40)
Albumin/Globulin Ratio: 1.7 (ref 1.2–2.2)
Albumin: 4.1 g/dL (ref 3.8–4.9)
Alkaline Phosphatase: 105 IU/L (ref 44–121)
BUN/Creatinine Ratio: 9 (ref 9–23)
BUN: 7 mg/dL (ref 6–24)
Bilirubin Total: 0.7 mg/dL (ref 0.0–1.2)
CO2: 25 mmol/L (ref 20–29)
Calcium: 9.3 mg/dL (ref 8.7–10.2)
Chloride: 98 mmol/L (ref 96–106)
Creatinine, Ser: 0.74 mg/dL (ref 0.57–1.00)
Globulin, Total: 2.4 g/dL (ref 1.5–4.5)
Glucose: 138 mg/dL — ABNORMAL HIGH (ref 65–99)
Potassium: 4.6 mmol/L (ref 3.5–5.2)
Sodium: 139 mmol/L (ref 134–144)
Total Protein: 6.5 g/dL (ref 6.0–8.5)
eGFR: 93 mL/min/{1.73_m2} (ref >59)

## 2020-10-06 LAB — CBC WITH DIFFERENTIAL/PLATELET
Basophils Absolute: 0 10*3/uL (ref 0.0–0.2)
Basos: 1 %
EOS (ABSOLUTE): 0.1 10*3/uL (ref 0.0–0.4)
Eos: 1 %
Hematocrit: 50.7 % — ABNORMAL HIGH (ref 34.0–46.6)
Hemoglobin: 16.3 g/dL — ABNORMAL HIGH (ref 11.1–15.9)
Immature Grans (Abs): 0 10*3/uL (ref 0.0–0.1)
Immature Granulocytes: 0 %
Lymphocytes Absolute: 1.7 10*3/uL (ref 0.7–3.1)
Lymphs: 25 %
MCH: 29.9 pg (ref 26.6–33.0)
MCHC: 32.1 g/dL (ref 31.5–35.7)
MCV: 93 fL (ref 79–97)
Monocytes Absolute: 0.6 10*3/uL (ref 0.1–0.9)
Monocytes: 8 %
Neutrophils Absolute: 4.3 10*3/uL (ref 1.4–7.0)
Neutrophils: 65 %
Platelets: 111 10*3/uL — ABNORMAL LOW (ref 150–450)
RBC: 5.45 x10E6/uL — ABNORMAL HIGH (ref 3.77–5.28)
RDW: 12.9 % (ref 11.7–15.4)
WBC: 6.7 10*3/uL (ref 3.4–10.8)

## 2020-10-06 LAB — HEMOGLOBIN A1C
Est. average glucose Bld gHb Est-mCnc: 169 mg/dL
Hgb A1c MFr Bld: 7.5 % — ABNORMAL HIGH (ref 4.8–5.6)

## 2020-10-06 LAB — MICROALBUMIN / CREATININE URINE RATIO
Creatinine, Urine: 79.6 mg/dL
Microalb/Creat Ratio: 12 mg/g creat (ref 0–29)
Microalbumin, Urine: 9.8 ug/mL

## 2020-10-06 LAB — TSH: TSH: 1.79 u[IU]/mL (ref 0.450–4.500)

## 2020-10-10 ENCOUNTER — Ambulatory Visit (INDEPENDENT_AMBULATORY_CARE_PROVIDER_SITE_OTHER): Payer: 59 | Admitting: Nurse Practitioner

## 2020-10-10 ENCOUNTER — Other Ambulatory Visit: Payer: Self-pay

## 2020-10-10 ENCOUNTER — Encounter: Payer: Self-pay | Admitting: Nurse Practitioner

## 2020-10-10 ENCOUNTER — Other Ambulatory Visit (HOSPITAL_COMMUNITY)
Admission: RE | Admit: 2020-10-10 | Discharge: 2020-10-10 | Disposition: A | Discharge status: Home / Self Care | Payer: 59 | Source: Ambulatory Visit | Attending: Nurse Practitioner | Admitting: Nurse Practitioner

## 2020-10-10 VITALS — BP 133/87 | HR 88 | Temp 97.8°F | Ht 68.0 in | Wt 267.6 lb

## 2020-10-10 DIAGNOSIS — E785 Hyperlipidemia, unspecified: Secondary | ICD-10-CM

## 2020-10-10 DIAGNOSIS — F17200 Nicotine dependence, unspecified, uncomplicated: Secondary | ICD-10-CM

## 2020-10-10 DIAGNOSIS — Z0001 Encounter for general adult medical examination with abnormal findings: Secondary | ICD-10-CM

## 2020-10-10 DIAGNOSIS — E119 Type 2 diabetes mellitus without complications: Secondary | ICD-10-CM | POA: Diagnosis not present

## 2020-10-10 DIAGNOSIS — Z01419 Encounter for gynecological examination (general) (routine) without abnormal findings: Secondary | ICD-10-CM | POA: Diagnosis present

## 2020-10-10 DIAGNOSIS — L02413 Cutaneous abscess of right upper limb: Secondary | ICD-10-CM

## 2020-10-10 DIAGNOSIS — I10 Essential (primary) hypertension: Secondary | ICD-10-CM

## 2020-10-10 LAB — POCT UA - MICROALBUMIN
Albumin/Creatinine Ratio, Urine, POC: <30
Creatinine, POC: 200 mg/dL
Microalbumin Ur, POC: 30 mg/L

## 2020-10-10 LAB — POCT GLYCOSYLATED HEMOGLOBIN (HGB A1C): Hemoglobin A1C: 7.1 % — AB (ref 4.0–5.6)

## 2020-10-10 MED ORDER — DOXYCYCLINE HYCLATE 100 MG PO TABS: 100.0000 mg | ORAL_TABLET | Freq: Two times a day (BID) | ORAL | 0 refills | Status: DC

## 2020-10-10 NOTE — Progress Notes (Signed)
Established Patient Office Visit  Subjective:  Patient ID: Molly Payne, female    DOB: 1960-11-06  Age: 60 y.o. MRN: [id number]  CC:  Chief Complaint  Patient presents with   Annual Exam   Gynecologic Exam    HPI Molly Payne presents for health maintenance exam.  She is due to have a Pap smear today also.  Her lipid panel was elevated.  She is currently on Crestor 40 mg tablets every evening.  Lipid Panel     Component Value Date/Time   CHOL 178 10/05/2020 0957   CHOL 153 11/02/2015 0827   TRIG 210 (H) 10/05/2020 0957   TRIG 159 (H) 11/02/2015 0827   HDL 33 (L) 10/05/2020 0957   HDL 28 (L) 11/02/2015 0827   CHOLHDL 5.4 (H) 10/05/2020 0957   CHOLHDL 4.2 11/22/2015 1622   VLDL 44 (H) 11/22/2015 1622   LDLCALC 108 (H) 10/05/2020 0957   LDLCALC 93 11/02/2015 0827   LABVLDL 37 10/05/2020 0957  After seeing the results of her lipid panel on my chart, she has already increase her exercise, added fish oil, and has decreased the fried and fatty foods in her diet. Check of her hemoglobin A1c today 7.1.  Her microalbumin is normal. Her hemoglobin and hematocrit were slightly elevated.  She is a smoker.  Platelet count was decreased.  Blood count is stable over the last few checks.  We will continue to monitor. She has blisterlike lesion in the right shoulder blade.  It appeared after having moderate sunburn to the shoulders.  Skin is peeling.  This focal lesion is tender and warm to touch.  She has noticed a scab. She has no other concerns or complaints today. She denies chest pain, chest pressure, or shortness of breath. She denies headaches or visual disturbances. She denies abdominal pain, nausea, vomiting, or changes in bowel or bladder habits.    Past Surgical History:  Procedure Laterality Date   ABDOMINAL HYSTERECTOMY     APPENDECTOMY     CHOLECYSTECTOMY      Family History  Problem Relation Age of Onset   Heart disease Mother    Breast cancer Mother    Heart disease  Sister    Heart disease Sister    Heart attack Sister    Colon cancer Neg Hx    Colon polyps Neg Hx    Esophageal cancer Neg Hx    Rectal cancer Neg Hx    Stomach cancer Neg Hx     Social History   Socioeconomic History   Marital status: Married    Spouse name: Not on file   Number of children: 2   Years of education: 12   Highest education level: Not on file  Occupational History   Occupation: customer service  Tobacco Use   Smoking status: Every Day    Packs/day: 1.00    Years: 32.00    Pack years: 32.00    Types: Cigarettes   Smokeless tobacco: Never  Vaping Use   Vaping Use: Never used  Substance and Sexual Activity   Alcohol use: No    Alcohol/week: 0.0 standard drinks   Drug use: No   Sexual activity: Not Currently    Partners: Male  Other Topics Concern   Not on file  Social History Narrative   epworth sleepiness scale = 3 (09/20/15)   Social Determinants of Health   Financial Resource Strain: Not on file  Food Insecurity: Not on file  Transportation Needs: Not on  file  Physical Activity: Not on file  Stress: Not on file  Social Connections: Not on file  Intimate Partner Violence: Not on file    Outpatient Medications Prior to Visit  Medication Sig Dispense Refill   ALPRAZolam (NIRAVAM) 0.25 MG dissolvable tablet Take 1 tablet (0.25 mg total) by mouth at bedtime as needed for anxiety. 30 tablet 0   aspirin 81 MG tablet Take 81 mg by mouth daily.     blood glucose meter kit and supplies KIT Dispense based on patient and insurance preference. Use up to four times daily as directed. (FOR ICD-10 E11.69). 1 each 0   chlorhexidine (PERIDEX) 0.12 % solution Use as directed 15 mLs in the mouth or throat 2 (two) times daily. 120 mL 1   escitalopram (LEXAPRO) 10 MG tablet Take 1 tablet (10 mg total) by mouth daily. 90 tablet 3   fluticasone (FLONASE) 50 MCG/ACT nasal spray Use 2 sprays each nostril twice daily for 5 days, then decrease to once daily 16 g 1    Guaifenesin (MUCINEX MAXIMUM STRENGTH) 1200 MG TB12 Take 1 tablet (1,200 mg total) by mouth every 12 (twelve) hours as needed. 14 tablet 1   ipratropium (ATROVENT) 0.03 % nasal spray Place 2 sprays into both nostrils 2 (two) times daily. 30 mL 0   Ipratropium-Albuterol (COMBIVENT) 20-100 MCG/ACT AERS respimat Inhale 1 puff into the lungs 4 (four) times daily. 4 g 0   lidocaine (XYLOCAINE) 2 % solution Use as directed 5 mLs in the mouth or throat as needed for mouth pain. 100 mL 0   losartan (COZAAR) 25 MG tablet TAKE 1 TABLET BY MOUTH ONCE DAILY NO  MORE  REFILLS  WITHOUT  OFFICE  VISIT 90 tablet 0   meloxicam (MOBIC) 7.5 MG tablet Take 1-2 tablets (7.5-15 mg total) by mouth daily as needed for pain. 30 tablet 0   metFORMIN (GLUCOPHAGE) 1000 MG tablet Take 1 tablet (1,000 mg total) by mouth daily with breakfast. 90 tablet 3   rosuvastatin (CRESTOR) 40 MG tablet Take 1 tablet (40 mg total) by mouth daily. 90 tablet 3   No facility-administered medications prior to visit.    No Known Allergies  ROS Review of Systems  Constitutional:  Negative for activity change, chills, fatigue and fever.  HENT:  Negative for congestion, postnasal drip, rhinorrhea, sinus pressure and sinus pain.   Eyes: Negative.   Respiratory:  Negative for cough, chest tightness, shortness of breath and wheezing.   Cardiovascular:  Negative for chest pain and palpitations.  Gastrointestinal:  Negative for constipation, diarrhea, nausea and vomiting.  Endocrine: Negative for cold intolerance, heat intolerance, polydipsia and polyuria.       Blood sugars have been stable.  Genitourinary:  Negative for dysuria, frequency and urgency.  Musculoskeletal:  Negative for arthralgias, back pain, myalgias and neck pain.  Skin:  Negative for rash.       Abscess type lesion to right shoulder blade.  Tender and warm with scabbing present.  Allergic/Immunologic: Negative.   Neurological:  Negative for dizziness, weakness and  headaches.  Psychiatric/Behavioral:  Negative for dysphoric mood. The patient is not nervous/anxious.      Objective:    Physical Exam Vitals and nursing note reviewed. Exam conducted with a chaperone present.  Constitutional:      Appearance: Normal appearance. She is well-developed. She is obese.  HENT:     Head: Normocephalic and atraumatic.     Right Ear: Ear canal and external ear normal.  Left Ear: Ear canal and external ear normal.     Nose: Nose normal.     Mouth/Throat:     Mouth: Mucous membranes are moist.     Pharynx: Oropharynx is clear.  Eyes:     Extraocular Movements: Extraocular movements intact.     Conjunctiva/sclera: Conjunctivae normal.     Pupils: Pupils are equal, round, and reactive to light.  Neck:     Vascular: No carotid bruit.  Cardiovascular:     Rate and Rhythm: Normal rate and regular rhythm.     Pulses:          Dorsalis pedis pulses are 1+ on the right side and 1+ on the left side.       Posterior tibial pulses are 1+ on the right side and 1+ on the left side.     Heart sounds: Normal heart sounds.  Pulmonary:     Effort: Pulmonary effort is normal.     Breath sounds: Normal breath sounds.  Chest:  Breasts:    Right: Normal. No swelling, bleeding, inverted nipple, mass, nipple discharge, skin change, tenderness or axillary adenopathy.     Left: Normal. No swelling, bleeding, inverted nipple, mass, nipple discharge, skin change, tenderness or axillary adenopathy.  Abdominal:     General: Bowel sounds are normal.     Palpations: Abdomen is soft.     Tenderness: There is no abdominal tenderness.     Hernia: There is no hernia in the left inguinal area or right inguinal area.  Genitourinary:    Exam position: Supine.     Labia:        Right: No rash, tenderness or lesion.        Left: No rash, tenderness or lesion.      Vagina: Normal. No vaginal discharge, erythema, tenderness, bleeding or lesions.     Cervix: No cervical motion  tenderness, discharge, friability, lesion, erythema or cervical bleeding.     Uterus: Normal.      Adnexa: Right adnexa normal and left adnexa normal.     Comments: No tenderness, masses, or organomeglay present during bimanual exam .   Musculoskeletal:        General: Normal range of motion.     Cervical back: Normal range of motion and neck supple.     Right foot: Normal range of motion. No deformity or bunion.     Left foot: Normal range of motion. No deformity or bunion.  Feet:     Right foot:     Protective Sensation: 10 sites tested.  10 sites sensed.     Skin integrity: Skin integrity normal.     Toenail Condition: Right toenails are normal.     Left foot:     Protective Sensation: 10 sites tested.  10 sites sensed.     Skin integrity: Skin integrity normal.     Toenail Condition: Left toenails are normal.  Lymphadenopathy:     Cervical: No cervical adenopathy.     Upper Body:     Right upper body: No axillary adenopathy.     Left upper body: No axillary adenopathy.     Lower Body: No right inguinal adenopathy. No left inguinal adenopathy.  Skin:    General: Skin is warm and dry.     Capillary Refill: Capillary refill takes less than 2 seconds.     Comments: Single basilic is in the right lower leg.  It is tender warm to palpate.  With several scabbed oozing present  in center.  No drainage noted at this time.  There is localized erythema present.  Skin peeling on entire shoulder.  Neurological:     General: No focal deficit present.     Mental Status: She is alert and oriented to person, place, and time.  Psychiatric:        Mood and Affect: Mood normal.        Behavior: Behavior normal.        Thought Content: Thought content normal.        Judgment: Judgment normal.   Today's Vitals   10/10/20 0902  BP: 133/87  Pulse: 88  Temp: 97.8 F (36.6 C)  SpO2: 97%  Weight: 267 lb 9.6 oz (121.4 kg)  Height: _0  (1.727 m)   Body mass index is 40.69 kg/m.   Wt  Readings from Last 3 Encounters:  10/10/20 267 lb 9.6 oz (121.4 kg)  07/03/20 276 lb 14.4 oz (125.6 kg)  07/06/19 260 lb 6.4 oz (118.1 kg)     Health Maintenance Due  Topic Date Due   Zoster Vaccines- Shingrix (1 of 2) Never done   PAP SMEAR-Modifier  08/08/2017   Pneumococcal Vaccine 60-34 Years old (2 - PCV) 12/01/2017   OPHTHALMOLOGY EXAM  12/06/2017   FOOT EXAM  02/23/2019   COVID-19 Vaccine (3 - Booster for Pfizer series) 01/04/2020   COLONOSCOPY (Pts 45-46yr Insurance coverage will need to be confirmed)  02/10/2020    There are no preventive care reminders to display for this patient.  Lab Results  Component Value Date   TSH 1.790 10/05/2020   Lab Results  Component Value Date   WBC 6.7 10/05/2020   HGB 16.3 (H) 10/05/2020   HCT 50.7 (H) 10/05/2020   MCV 93 10/05/2020   PLT 111 (L) 10/05/2020   Lab Results  Component Value Date   NA 139 10/05/2020   K 4.6 10/05/2020   CO2 25 10/05/2020   GLUCOSE 138 (H) 10/05/2020   BUN 7 10/05/2020   CREATININE 0.74 10/05/2020   BILITOT 0.7 10/05/2020   ALKPHOS 105 10/05/2020   AST 36 10/05/2020   ALT 24 10/05/2020   PROT 6.5 10/05/2020   ALBUMIN 4.1 10/05/2020   CALCIUM 9.3 10/05/2020   ANIONGAP 10 07/08/2015   EGFR 93 10/05/2020   Lab Results  Component Value Date   CHOL 178 10/05/2020   Lab Results  Component Value Date   HDL 33 (L) 10/05/2020   Lab Results  Component Value Date   LDLCALC 108 (H) 10/05/2020   Lab Results  Component Value Date   TRIG 210 (H) 10/05/2020   Lab Results  Component Value Date   CHOLHDL 5.4 (H) 10/05/2020   Lab Results  Component Value Date   HGBA1C 7.1 (A) 10/10/2020      Assessment & Plan:  1. Encounter for general adult medical examination with abnormal findings Annual health maintenance exam today.  2. Type 2 diabetes mellitus without complication, without long-term current use of insulin (HCC) Hemoglobin A1c is 7.1 today and urine microalbumin is normal.   Continue current medications as prescribed.  Limit intake of carbohydrates and sugar.  Encouraged her to incorporate exercise into daily routine. - POCT glycosylated hemoglobin (Hb A1C) - POCT UA - Microalbumin  3. Abscess of skin of right shoulder Trial doxycycline 100 mg 1 tablet twice daily for next 10 days.  She keep lesion clean and dry.  Advised to notify the office if no improvement over next 5 to 7  days. - doxycycline (VIBRA-TABS) 100 MG tablet; Take 1 tablet (100 mg total) by mouth 2 (two) times daily.  Dispense: 20 tablet; Refill: 0  4. Essential hypertension Blood pressure stable.  Continue medications as prescribed.  5. Dyslipidemia Reviewed fasting blood work.  Cholesterol elevated.  Continue Crestor 40 mg tablets every day.  Advised her to limit fried and fatty foods in her diet and increase physical activity.  Written information provided to support discussion.  24. Well woman exam Pap smear obtained today. - Cytology - PAP( [facility])  7. Smoker Patient smoking approximately 1 pack of cigarettes per day.  Not ready to quit at this time.  Problem List Items Addressed This Visit       Cardiovascular and Mediastinum   Essential hypertension     Endocrine   Type 2 diabetes mellitus without complication, without long-term current use of insulin (HCC)   Relevant Orders   POCT glycosylated hemoglobin (Hb A1C) (Completed)   POCT UA - Microalbumin (Completed)     Musculoskeletal and Integument   Abscess of skin of right shoulder   Relevant Medications   doxycycline (VIBRA-TABS) 100 MG tablet     Other   Dyslipidemia   Smoker   Encounter for general adult medical examination with abnormal findings - Primary   Well woman exam   Relevant Orders   Cytology - PAP( [place])    Meds ordered this encounter  Medications   doxycycline (VIBRA-TABS) 100 MG tablet    Sig: Take 1 tablet (100 mg total) by mouth 2 (two) times daily.    Dispense:  20 tablet    Refill:   0    Order Specific Question:   Supervising Provider    Answer:   Beatrice Lecher D [[id number]]    Follow-up: Return in about 3 months (around 01/10/2021) for diabetes with HgbA1c check - in 6 months - f/u with fasting lipids and CBC a week prior.Marland Kitchen    Ronnell Freshwater, NP

## 2020-10-10 NOTE — Progress Notes (Signed)
Reviewed with patient during visit. Repeat lipids and cbc in 6 months and discuss at visit

## 2020-10-14 DIAGNOSIS — L02413 Cutaneous abscess of right upper limb: Secondary | ICD-10-CM | POA: Insufficient documentation

## 2020-10-14 DIAGNOSIS — Z01419 Encounter for gynecological examination (general) (routine) without abnormal findings: Secondary | ICD-10-CM | POA: Insufficient documentation

## 2020-10-14 DIAGNOSIS — Z0001 Encounter for general adult medical examination with abnormal findings: Secondary | ICD-10-CM | POA: Insufficient documentation

## 2020-10-15 LAB — CYTOLOGY - PAP
Comment: NEGATIVE
Diagnosis: NEGATIVE
High risk HPV: NEGATIVE

## 2020-10-16 NOTE — Progress Notes (Signed)
Please let the patient know that her pap smear was negative with negative HPV. We will repeat in three years. Thanks.

## 2020-12-14 ENCOUNTER — Other Ambulatory Visit: Payer: Self-pay | Admitting: Registered Nurse

## 2020-12-14 DIAGNOSIS — E785 Hyperlipidemia, unspecified: Secondary | ICD-10-CM

## 2020-12-14 DIAGNOSIS — E119 Type 2 diabetes mellitus without complications: Secondary | ICD-10-CM

## 2020-12-26 ENCOUNTER — Other Ambulatory Visit: Payer: Self-pay | Admitting: Nurse Practitioner

## 2020-12-26 DIAGNOSIS — E785 Hyperlipidemia, unspecified: Secondary | ICD-10-CM

## 2020-12-26 DIAGNOSIS — E119 Type 2 diabetes mellitus without complications: Secondary | ICD-10-CM

## 2020-12-26 MED ORDER — ROSUVASTATIN CALCIUM 40 MG PO TABS: 40.0000 mg | ORAL_TABLET | Freq: Every day | ORAL | 0 refills | Status: DC

## 2021-01-11 ENCOUNTER — Other Ambulatory Visit: Payer: 59

## 2021-01-17 ENCOUNTER — Ambulatory Visit: Payer: 59 | Admitting: Nurse Practitioner

## 2021-01-22 ENCOUNTER — Other Ambulatory Visit: Payer: Self-pay

## 2021-01-22 DIAGNOSIS — Z1329 Encounter for screening for other suspected endocrine disorder: Secondary | ICD-10-CM

## 2021-01-22 DIAGNOSIS — E785 Hyperlipidemia, unspecified: Secondary | ICD-10-CM

## 2021-01-22 DIAGNOSIS — Z130 Encounter for screening for diseases of the blood and blood-forming organs and certain disorders involving the immune mechanism: Secondary | ICD-10-CM

## 2021-01-23 ENCOUNTER — Other Ambulatory Visit (INDEPENDENT_AMBULATORY_CARE_PROVIDER_SITE_OTHER): Payer: 59

## 2021-01-23 ENCOUNTER — Other Ambulatory Visit: Payer: Self-pay

## 2021-01-23 DIAGNOSIS — Z13228 Encounter for screening for other metabolic disorders: Secondary | ICD-10-CM

## 2021-01-23 DIAGNOSIS — Z130 Encounter for screening for diseases of the blood and blood-forming organs and certain disorders involving the immune mechanism: Secondary | ICD-10-CM

## 2021-01-23 DIAGNOSIS — E785 Hyperlipidemia, unspecified: Secondary | ICD-10-CM

## 2021-01-24 LAB — CBC WITH DIFFERENTIAL/PLATELET
Basophils Absolute: 0.1 10*3/uL (ref 0.0–0.2)
Basos: 1 %
EOS (ABSOLUTE): 0.1 10*3/uL (ref 0.0–0.4)
Eos: 1 %
Hematocrit: 50.1 % — ABNORMAL HIGH (ref 34.0–46.6)
Hemoglobin: 16.6 g/dL — ABNORMAL HIGH (ref 11.1–15.9)
Immature Grans (Abs): 0.1 10*3/uL (ref 0.0–0.1)
Immature Granulocytes: 1 %
Lymphocytes Absolute: 1.8 10*3/uL (ref 0.7–3.1)
Lymphs: 25 %
MCH: 30.6 pg (ref 26.6–33.0)
MCHC: 33.1 g/dL (ref 31.5–35.7)
MCV: 92 fL (ref 79–97)
Monocytes Absolute: 0.6 10*3/uL (ref 0.1–0.9)
Monocytes: 9 %
Neutrophils Absolute: 4.6 10*3/uL (ref 1.4–7.0)
Neutrophils: 63 %
Platelets: 110 10*3/uL — ABNORMAL LOW (ref 150–450)
RBC: 5.43 x10E6/uL — ABNORMAL HIGH (ref 3.77–5.28)
RDW: 12.8 % (ref 11.7–15.4)
WBC: 7.2 10*3/uL (ref 3.4–10.8)

## 2021-01-24 LAB — LIPID PANEL
Chol/HDL Ratio: 3.8 ratio (ref 0.0–4.4)
Cholesterol, Total: 139 mg/dL (ref 100–199)
HDL: 37 mg/dL — ABNORMAL LOW (ref >39)
LDL Chol Calc (NIH): 79 mg/dL (ref 0–99)
Triglycerides: 126 mg/dL (ref 0–149)
VLDL Cholesterol Cal: 23 mg/dL (ref 5–40)

## 2021-01-24 NOTE — Progress Notes (Signed)
Improved labs. Discuss with patient at visit 01/25/2021

## 2021-01-31 ENCOUNTER — Ambulatory Visit (INDEPENDENT_AMBULATORY_CARE_PROVIDER_SITE_OTHER): Payer: 59 | Admitting: Nurse Practitioner

## 2021-01-31 ENCOUNTER — Encounter: Payer: Self-pay | Admitting: Nurse Practitioner

## 2021-01-31 DIAGNOSIS — E785 Hyperlipidemia, unspecified: Secondary | ICD-10-CM

## 2021-01-31 DIAGNOSIS — I10 Essential (primary) hypertension: Secondary | ICD-10-CM | POA: Diagnosis not present

## 2021-01-31 DIAGNOSIS — E119 Type 2 diabetes mellitus without complications: Secondary | ICD-10-CM | POA: Diagnosis not present

## 2021-01-31 MED ORDER — ROSUVASTATIN CALCIUM 40 MG PO TABS: 40.0000 mg | ORAL_TABLET | Freq: Every day | ORAL | 1 refills | Status: DC

## 2021-01-31 MED ORDER — LOSARTAN POTASSIUM 25 MG PO TABS: ORAL_TABLET | ORAL | 1 refills | Status: DC

## 2021-01-31 NOTE — Progress Notes (Signed)
Virtual Visit via Telephone Note  I connected with Molly Payne on 02/10/21 at  8:50 AM EDT by telephone and verified that I am speaking with the correct person using two identifiers.  Location: Patient: home Provider: [facility]     I discussed the limitations, risks, security and privacy concerns of performing an evaluation and management service by telephone and the availability of in person appointments. I also discussed with the patient that there may be a patient responsible charge related to this service. The patient expressed understanding and agreed to proceed.   History of Present Illness: The patient presents for routine follow up visit. She was recently seen by [facility] provider due to bronchitis. Visit was on Tuesday 01/29/2021. She was started on inhaler, nasal spray, and cough perls. She states that today, she is finally starting to feel some better.  She did have fasting lipids checked prior to this visit. She is on Crestor 40mg  daily. Had not been taking it regularly, but lipid panel was moderately elevated when I saw her last. Most recent lipid pael is much improved. The patient reports she is doing better with taking crestor and has made some improvements to her diet.   Lipid Panel     Component Value Date/Time   CHOL 139 01/23/2021 0828   CHOL 153 11/02/2015 0827   TRIG 126 01/23/2021 0828   TRIG 159 (H) 11/02/2015 0827   HDL 37 (L) 01/23/2021 0828   HDL 28 (L) 11/02/2015 0827   CHOLHDL 3.8 01/23/2021 0828   CHOLHDL 4.2 11/22/2015 1622   VLDL 44 (H) 11/22/2015 1622   LDLCALC 79 01/23/2021 0828   LDLCALC 93 11/02/2015 0827   LABVLDL 23 01/23/2021 0828    She is diabetic. Does not routinely check her blood sugars. Most recent HgbA1c was done 11/01/2020 and was 7.1. will check this at her next, in-office visit.  She has no new concerns or complaints today. She denies chest pain, chest pressure, or shortness of breath. She denies  headaches or visual disturbances. She denies abdominal pain, nausea, vomiting, or changes in bowel or bladder habits.     Observations/Objective:   The patient is alert and oriented. She is pleasant and answers all questions appropriately. Breathing is non-labored. She is in no acute distress at this time.  She does have dry cough.   Today's Vitals   01/31/21 0854  Weight: 267 lb 9.6 oz (121.4 kg)  Height: 5\' 8"  (1.727 m)   Body mass index is 40.69 kg/m.   Assessment and Plan: 1. Type 2 diabetes mellitus without complication, without long-term current use of insulin (HCC) Blood sugars well managed.  Continue diabetic medication as prescribed.  Check hemoglobin A1c at next office visit in 2 months.  Continue losartan 25 mg tablets daily and rosuvastatin 40 mg every evening. - losartan (COZAAR) 25 MG tablet; TAKE 1 TABLET BY MOUTH ONCE DAILY NO  MORE  REFILLS  WITHOUT  OFFICE  VISIT  Dispense: 90 tablet; Refill: 1 - rosuvastatin (CRESTOR) 40 MG tablet; Take 1 tablet (40 mg total) by mouth daily.  Dispense: 90 tablet; Refill: 1  2. Essential hypertension Blood pressure stable.  Continue losartan 25 mg tablets daily. - losartan (COZAAR) 25 MG tablet; TAKE 1 TABLET BY MOUTH ONCE DAILY NO  MORE  REFILLS  WITHOUT  OFFICE  VISIT  Dispense: 90 tablet; Refill: 1  3. Dyslipidemia Improved lipid panel.  Continue Crestor 40 mg every evening. - rosuvastatin (CRESTOR)  40 MG tablet; Take 1 tablet (40 mg total) by mouth daily.  Dispense: 90 tablet; Refill: 1   Follow Up Instructions:    I discussed the assessment and treatment plan with the patient. The patient was provided an opportunity to ask questions and all were answered. The patient agreed with the plan and demonstrated an understanding of the instructions.   The patient was advised to call back or seek an in-person evaluation if the symptoms worsen or if the condition fails to improve as anticipated.  I provided 15 minutes of  non-face-to-face time during this encounter.   Ronnell Freshwater, NP

## 2021-02-25 IMAGING — MG DIGITAL SCREENING BILAT W/ CAD
6 series · 6 of 6 positions shown · non-contrast
Comparison: Previous exam(s).

CLINICAL DATA: Screening.

EXAM:
DIGITAL SCREENING BILATERAL MAMMOGRAM WITH CAD

[L CC]
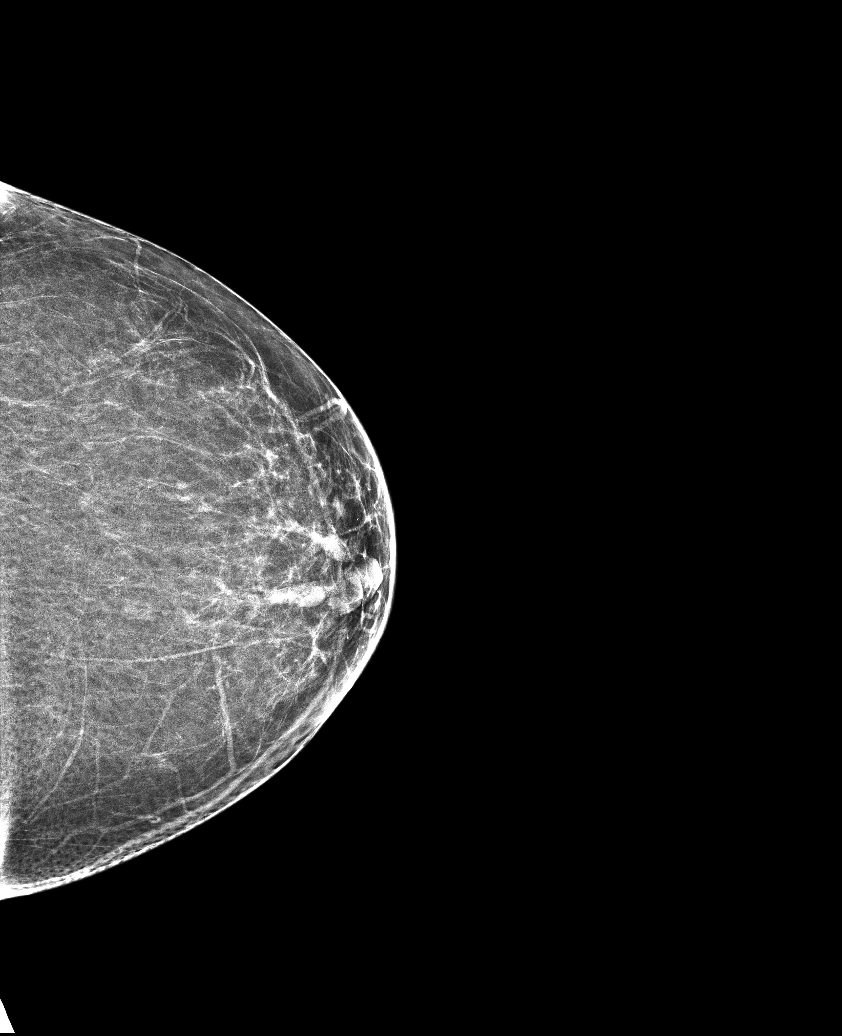

[R MLO (1 of 2)]
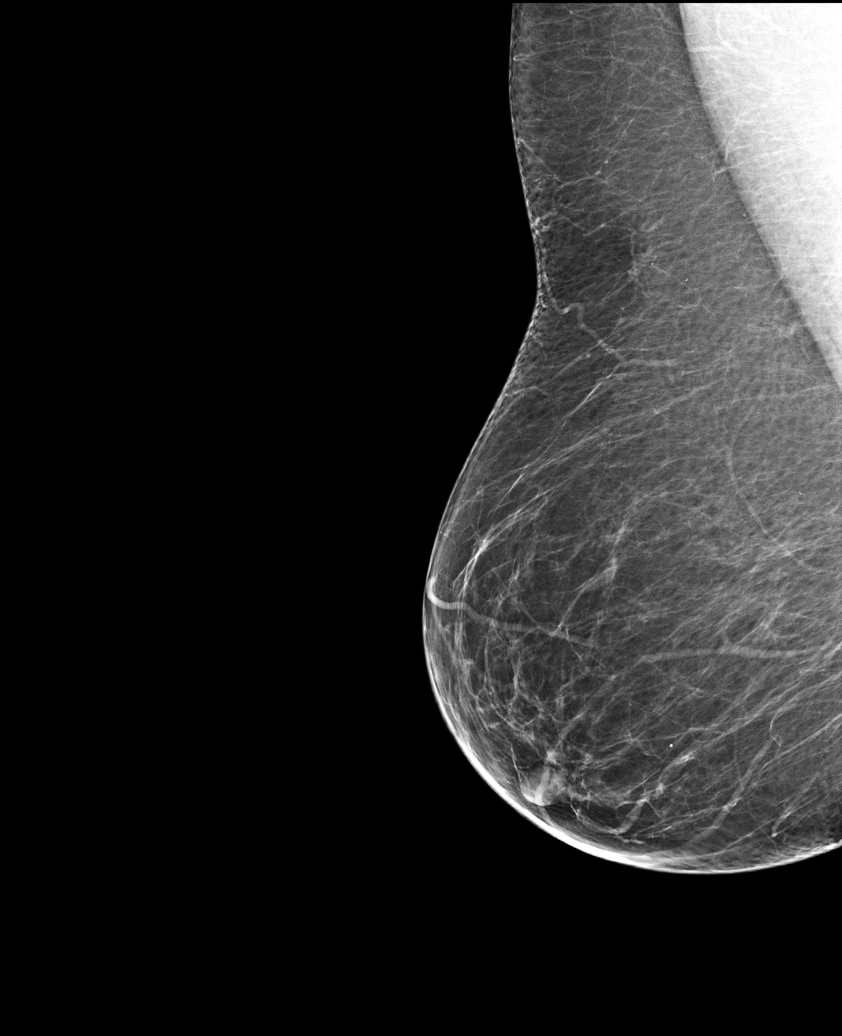

[R MLO (2 of 2)]
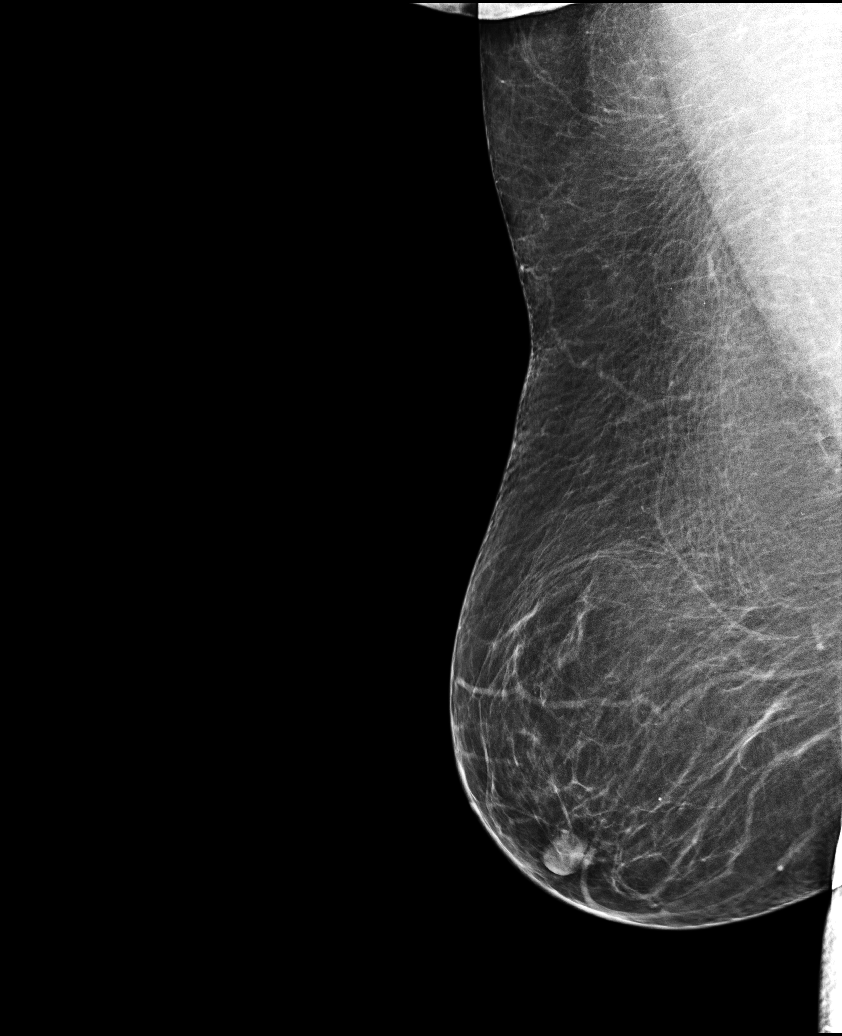

[R CC]
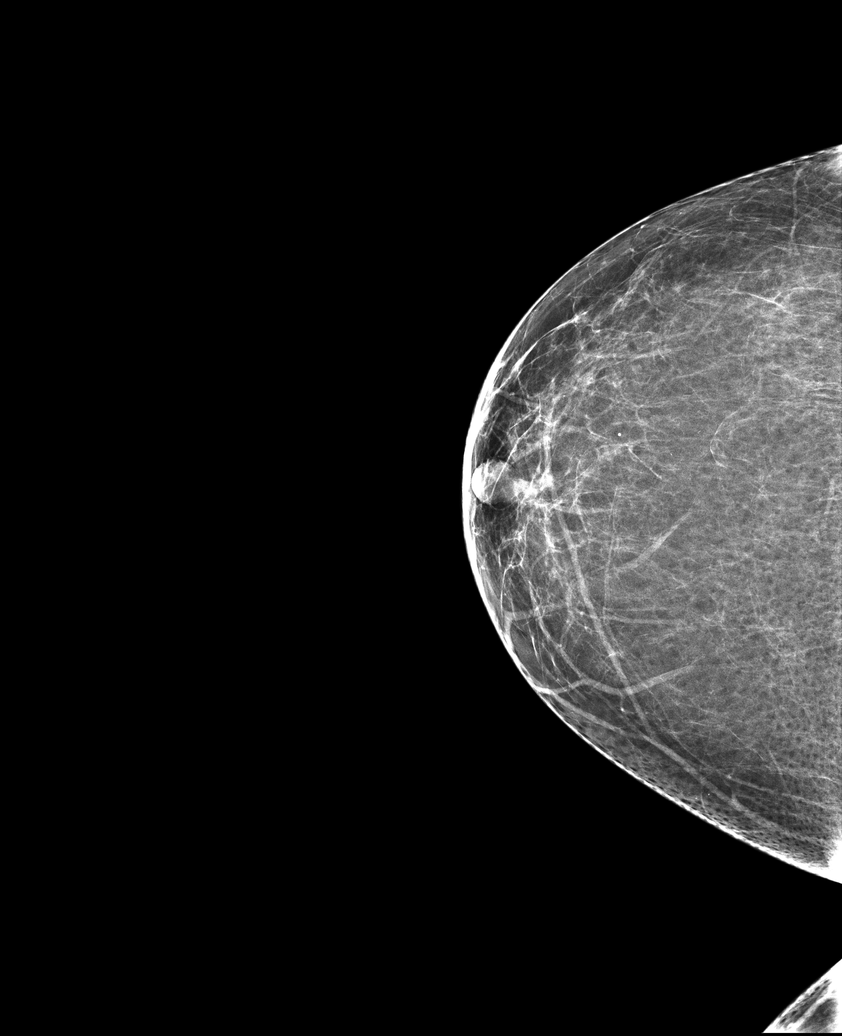

[L MLO]
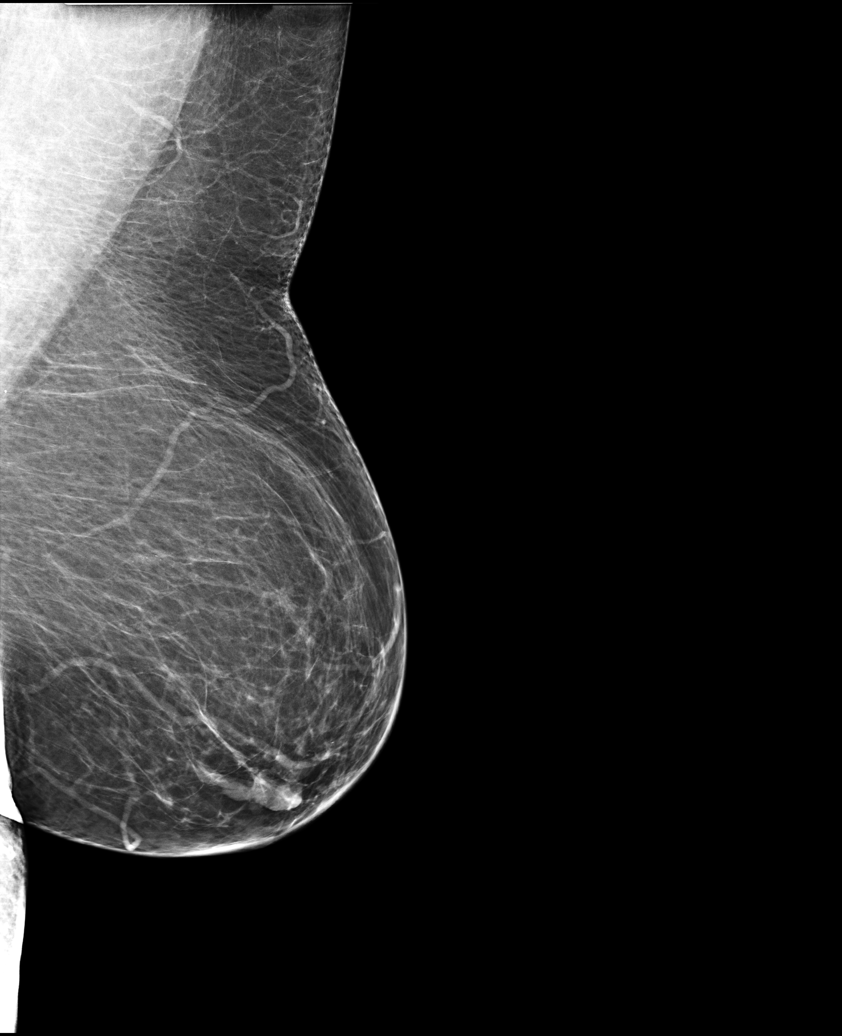

[R CV]
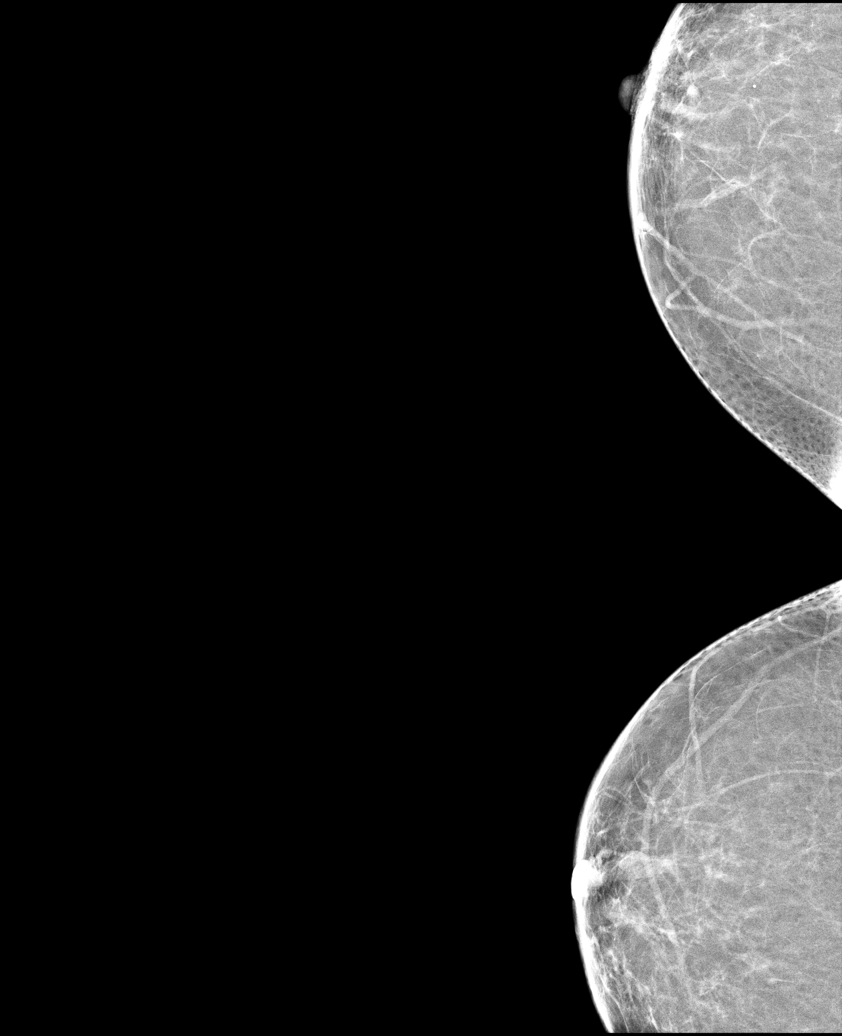

[6 of 6 positions shown; findings below may reference images not displayed]

ACR Breast Density Category b: There are scattered areas of
fibroglandular density.
FINDINGS: In the left breast, calcifications warrant further evaluation. In
the right breast, no findings suspicious for malignancy. Images were
processed with CAD.
IMPRESSION: Further evaluation is suggested for calcifications in the left
breast.

RECOMMENDATION:
Diagnostic mammogram of the left breast. (Code:[id number])

The patient will be contacted regarding the findings, and additional
imaging will be scheduled.

BI-RADS CATEGORY  0: Incomplete. Need additional imaging evaluation
and/or prior mammograms for comparison.

## 2021-04-29 ENCOUNTER — Ambulatory Visit (INDEPENDENT_AMBULATORY_CARE_PROVIDER_SITE_OTHER): Payer: 59 | Admitting: Nurse Practitioner

## 2021-04-29 ENCOUNTER — Encounter: Payer: Self-pay | Admitting: Nurse Practitioner

## 2021-04-29 ENCOUNTER — Other Ambulatory Visit: Payer: Self-pay

## 2021-04-29 VITALS — BP 138/77 | HR 84 | Temp 98.2°F | Ht 68.0 in | Wt 265.0 lb

## 2021-04-29 DIAGNOSIS — R0981 Nasal congestion: Secondary | ICD-10-CM | POA: Diagnosis not present

## 2021-04-29 DIAGNOSIS — J0140 Acute pansinusitis, unspecified: Secondary | ICD-10-CM

## 2021-04-29 DIAGNOSIS — Z6841 Body Mass Index (BMI) 40.0 and over, adult: Secondary | ICD-10-CM | POA: Diagnosis not present

## 2021-04-29 DIAGNOSIS — R051 Acute cough: Secondary | ICD-10-CM | POA: Diagnosis not present

## 2021-04-29 LAB — POCT INFLUENZA A/B
Influenza A, POC: NEGATIVE
Influenza B, POC: NEGATIVE

## 2021-04-29 MED ORDER — BENZONATATE 100 MG PO CAPS: 100.0000 mg | ORAL_CAPSULE | Freq: Three times a day (TID) | ORAL | 0 refills | Status: DC | PRN

## 2021-04-29 MED ORDER — AZITHROMYCIN 250 MG PO TABS: ORAL_TABLET | ORAL | 0 refills | Status: DC

## 2021-04-29 MED ORDER — IPRATROPIUM-ALBUTEROL 20-100 MCG/ACT IN AERS: 1.0000 | INHALATION_SPRAY | Freq: Four times a day (QID) | RESPIRATORY_TRACT | 3 refills | Status: DC

## 2021-04-29 NOTE — Progress Notes (Signed)
Acute Office Visit  Subjective:    Patient ID: Molly Payne, female    DOB: 04-09-1960, 61 y.o.   MRN: [id number]  Chief Complaint  Patient presents with   Nasal Congestion    The patient did take a home test for COVID 19 yesterday and results were negative   URI  This is a new problem. The current episode started in the past 7 days. The problem has been gradually worsening. There has been no fever. Associated symptoms include congestion, coughing, headaches, nausea, rhinorrhea, sinus pain, sneezing, a sore throat and wheezing. Pertinent negatives include no abdominal pain, chest pain, diarrhea, dysuria, ear pain, joint pain, joint swelling, neck pain, plugged ear sensation, rash, swollen glands or vomiting. She has tried inhaler use (tessalon perls and nasal spray) for the symptoms. The treatment provided mild relief.    Past Medical History:  Diagnosis Date   Allergy    Diabetes mellitus without complication ([place])    Hyperlipidemia     Past Surgical History:  Procedure Laterality Date   ABDOMINAL HYSTERECTOMY     APPENDECTOMY     CHOLECYSTECTOMY      Family History  Problem Relation Age of Onset   Heart disease Mother    Breast cancer Mother    Heart disease Sister    Heart disease Sister    Heart attack Sister    Colon cancer Neg Hx    Colon polyps Neg Hx    Esophageal cancer Neg Hx    Rectal cancer Neg Hx    Stomach cancer Neg Hx     Social History   Socioeconomic History   Marital status: Married    Spouse name: Not on file   Number of children: 2   Years of education: 12   Highest education level: Not on file  Occupational History   Occupation: customer service  Tobacco Use   Smoking status: Every Day    Packs/day: 1.00    Years: 32.00    Pack years: 32.00    Types: Cigarettes   Smokeless tobacco: Never  Vaping Use   Vaping Use: Never used  Substance and Sexual Activity   Alcohol use: No    Alcohol/week: 0.0 standard drinks   Drug use: No    Sexual activity: Not Currently    Partners: Male  Other Topics Concern   Not on file  Social History Narrative   epworth sleepiness scale = 3 (09/20/15)   Social Determinants of Health   Financial Resource Strain: Not on file  Food Insecurity: Not on file  Transportation Needs: Not on file  Physical Activity: Not on file  Stress: Not on file  Social Connections: Not on file  Intimate Partner Violence: Not on file    Outpatient Medications Prior to Visit  Medication Sig Dispense Refill   albuterol (VENTOLIN HFA) 108 (90 Base) MCG/ACT inhaler SMARTSIG:2 Puff(s) By Mouth 4 Times Daily PRN     ALPRAZolam (NIRAVAM) 0.25 MG dissolvable tablet Take 1 tablet (0.25 mg total) by mouth at bedtime as needed for anxiety. 30 tablet 0   aspirin 81 MG tablet Take 81 mg by mouth daily.     blood glucose meter kit and supplies KIT Dispense based on patient and insurance preference. Use up to four times daily as directed. (FOR ICD-10 E11.69). 1 each 0   chlorhexidine (PERIDEX) 0.12 % solution Use as directed 15 mLs in the mouth or throat 2 (two) times daily. 120 mL 1   doxycycline (  VIBRA-TABS) 100 MG tablet Take 1 tablet (100 mg total) by mouth 2 (two) times daily. 20 tablet 0   escitalopram (LEXAPRO) 10 MG tablet Take 1 tablet (10 mg total) by mouth daily. 90 tablet 3   fluticasone (FLONASE) 50 MCG/ACT nasal spray Use 2 sprays each nostril twice daily for 5 days, then decrease to once daily 16 g 1   Guaifenesin (MUCINEX MAXIMUM STRENGTH) 1200 MG TB12 Take 1 tablet (1,200 mg total) by mouth every 12 (twelve) hours as needed. 14 tablet 1   ipratropium (ATROVENT) 0.03 % nasal spray Place 2 sprays into both nostrils 2 (two) times daily. 30 mL 0   lidocaine (XYLOCAINE) 2 % solution Use as directed 5 mLs in the mouth or throat as needed for mouth pain. 100 mL 0   losartan (COZAAR) 25 MG tablet TAKE 1 TABLET BY MOUTH ONCE DAILY NO  MORE  REFILLS  WITHOUT  OFFICE  VISIT 90 tablet 1   meloxicam (MOBIC) 7.5 MG  tablet Take 1-2 tablets (7.5-15 mg total) by mouth daily as needed for pain. 30 tablet 0   metFORMIN (GLUCOPHAGE) 1000 MG tablet Take 1 tablet (1,000 mg total) by mouth daily with breakfast. 90 tablet 3   rosuvastatin (CRESTOR) 40 MG tablet Take 1 tablet (40 mg total) by mouth daily. 90 tablet 1   benzonatate (TESSALON) 100 MG capsule Take 100 mg by mouth 3 (three) times daily as needed.     Ipratropium-Albuterol (COMBIVENT) 20-100 MCG/ACT AERS respimat Inhale 1 puff into the lungs 4 (four) times daily. 4 g 0   No facility-administered medications prior to visit.    No Known Allergies  Review of Systems  Constitutional:  Positive for fatigue. Negative for activity change, appetite change, chills and fever.  HENT:  Positive for congestion, postnasal drip, rhinorrhea, sinus pressure, sinus pain, sneezing and sore throat. Negative for ear pain.   Eyes: Negative.   Respiratory:  Positive for cough and wheezing. Negative for chest tightness and shortness of breath.   Cardiovascular:  Negative for chest pain and palpitations.  Gastrointestinal:  Positive for nausea. Negative for abdominal pain, constipation, diarrhea and vomiting.  Endocrine: Negative for cold intolerance, heat intolerance, polydipsia and polyuria.  Genitourinary:  Negative for dyspareunia, dysuria, flank pain, frequency and urgency.  Musculoskeletal:  Negative for arthralgias, back pain, joint pain, myalgias and neck pain.  Skin:  Negative for rash.  Allergic/Immunologic: Positive for environmental allergies.  Neurological:  Positive for headaches. Negative for dizziness and weakness.  Hematological:  Positive for adenopathy.  Psychiatric/Behavioral:  The patient is not nervous/anxious.       Objective:    Physical Exam Vitals and nursing note reviewed.  Constitutional:      Appearance: Normal appearance. She is well-developed. She is ill-appearing.  HENT:     Head: Normocephalic.     Right Ear: Tympanic membrane is  erythematous and bulging.     Left Ear: Tympanic membrane is erythematous and bulging.     Nose: Congestion present.     Right Sinus: Maxillary sinus tenderness and frontal sinus tenderness present.     Left Sinus: Maxillary sinus tenderness and frontal sinus tenderness present.     Mouth/Throat:     Pharynx: Posterior oropharyngeal erythema present.  Eyes:     Pupils: Pupils are equal, round, and reactive to light.  Cardiovascular:     Rate and Rhythm: Normal rate and regular rhythm.     Pulses: Normal pulses.     Heart sounds: Normal  heart sounds.  Pulmonary:     Effort: Pulmonary effort is normal.     Breath sounds: Normal breath sounds.     Comments: Congested, non-productive cough noted today  Abdominal:     Palpations: Abdomen is soft.  Musculoskeletal:        General: Normal range of motion.     Cervical back: Normal range of motion and neck supple.  Lymphadenopathy:     Cervical: No cervical adenopathy.  Skin:    General: Skin is warm and dry.     Capillary Refill: Capillary refill takes less than 2 seconds.  Neurological:     General: No focal deficit present.     Mental Status: She is alert and oriented to person, place, and time.  Psychiatric:        Mood and Affect: Mood normal.        Behavior: Behavior normal.        Thought Content: Thought content normal.        Judgment: Judgment normal.    Today's Vitals   04/29/21 1604  BP: 138/77  Pulse: 84  Temp: 98.2 F (36.8 C)  SpO2: 96%  Weight: 265 lb (120.2 kg)  Height: 5' 8" (1.727 m)   Body mass index is 40.29 kg/m.   Wt Readings from Last 3 Encounters:  04/29/21 265 lb (120.2 kg)  01/31/21 267 lb 9.6 oz (121.4 kg)  10/10/20 267 lb 9.6 oz (121.4 kg)    Health Maintenance Due  Topic Date Due   Zoster Vaccines- Shingrix (1 of 2) Never done   OPHTHALMOLOGY EXAM  12/06/2017   COVID-19 Vaccine (3 - Booster for Pfizer series) 09/29/2019   HEMOGLOBIN A1C  04/12/2021    There are no preventive care  reminders to display for this patient.   Lab Results  Component Value Date   TSH 1.790 10/05/2020   Lab Results  Component Value Date   WBC 7.2 01/23/2021   HGB 16.6 (H) 01/23/2021   HCT 50.1 (H) 01/23/2021   MCV 92 01/23/2021   PLT 110 (L) 01/23/2021   Lab Results  Component Value Date   NA 139 10/05/2020   K 4.6 10/05/2020   CO2 25 10/05/2020   GLUCOSE 138 (H) 10/05/2020   BUN 7 10/05/2020   CREATININE 0.74 10/05/2020   BILITOT 0.7 10/05/2020   ALKPHOS 105 10/05/2020   AST 36 10/05/2020   ALT 24 10/05/2020   PROT 6.5 10/05/2020   ALBUMIN 4.1 10/05/2020   CALCIUM 9.3 10/05/2020   ANIONGAP 10 07/08/2015   EGFR 93 10/05/2020   Lab Results  Component Value Date   CHOL 139 01/23/2021   Lab Results  Component Value Date   HDL 37 (L) 01/23/2021   Lab Results  Component Value Date   LDLCALC 79 01/23/2021   Lab Results  Component Value Date   TRIG 126 01/23/2021   Lab Results  Component Value Date   CHOLHDL 3.8 01/23/2021   Lab Results  Component Value Date   HGBA1C 7.1 (A) 10/10/2020       Assessment & Plan:  1. Sinus congestion Flu swab negative during today's visit.  We will treat as sinus infection. - POCT Influenza A/B  2. Acute non-recurrent pansinusitis Start Z-Pak.  Take as directed with relief. Rest and increase fluids. Continue using OTC medication to control symptoms.   - azithromycin (ZITHROMAX) 250 MG tablet; z-pack - take as directed for 5 days  Dispense: 6 tablet; Refill: 0  3. Acute  cough Tessalon Perles may be taken up to 3 times daily as needed for acute cough.  Patient should also use Combivent rescue inhaler as needed and as prescribed. - Ipratropium-Albuterol (COMBIVENT) 20-100 MCG/ACT AERS respimat; Inhale 1 puff into the lungs 4 (four) times daily.  Dispense: 4 g; Refill: 3 - benzonatate (TESSALON) 100 MG capsule; Take 1 capsule (100 mg total) by mouth 3 (three) times daily as needed.  Dispense: 30 capsule; Refill: 0  4. Body  mass index (BMI) of 40.1-44.9 in adult [facility]) Discussed lowering calorie intake to 1500 calories per day and incorporating exercise into daily routine to help lose weight.    Problem List Items Addressed This Visit       Respiratory   Sinus congestion - Primary   Relevant Orders   POCT Influenza A/B (Completed)   Acute non-recurrent pansinusitis   Relevant Medications   benzonatate (TESSALON) 100 MG capsule   azithromycin (ZITHROMAX) 250 MG tablet     Other   Acute cough   Relevant Medications   Ipratropium-Albuterol (COMBIVENT) 20-100 MCG/ACT AERS respimat   benzonatate (TESSALON) 100 MG capsule   Body mass index (BMI) of 40.1-44.9 in adult [facility])     Meds ordered this encounter  Medications   Ipratropium-Albuterol (COMBIVENT) 20-100 MCG/ACT AERS respimat    Sig: Inhale 1 puff into the lungs 4 (four) times daily.    Dispense:  4 g    Refill:  3    Order Specific Question:   Supervising Provider    Answer:   Beatrice Lecher D [[id number]]   benzonatate (TESSALON) 100 MG capsule    Sig: Take 1 capsule (100 mg total) by mouth 3 (three) times daily as needed.    Dispense:  30 capsule    Refill:  0    Order Specific Question:   Supervising Provider    Answer:   Beatrice Lecher D [[id number]]   azithromycin (ZITHROMAX) 250 MG tablet    Sig: z-pack - take as directed for 5 days    Dispense:  6 tablet    Refill:  0    Order Specific Question:   Supervising Provider    Answer:   Beatrice Lecher D [[id number]]     Ronnell Freshwater, NP  This note was dictated using Dragon Voice Recognition Software. Rapid proofreading was performed to expedite the delivery of the information. Despite proofreading, phonetic errors will occur which are common with this voice recognition software. Please take this into consideration. If there are any concerns, please contact our office.

## 2021-05-05 DIAGNOSIS — R051 Acute cough: Secondary | ICD-10-CM | POA: Insufficient documentation

## 2021-05-05 DIAGNOSIS — Z6841 Body Mass Index (BMI) 40.0 and over, adult: Secondary | ICD-10-CM | POA: Insufficient documentation

## 2021-05-05 DIAGNOSIS — R0981 Nasal congestion: Secondary | ICD-10-CM | POA: Insufficient documentation

## 2021-05-05 DIAGNOSIS — J0140 Acute pansinusitis, unspecified: Secondary | ICD-10-CM | POA: Insufficient documentation

## 2021-07-25 ENCOUNTER — Other Ambulatory Visit: Payer: Self-pay | Admitting: Nurse Practitioner

## 2021-07-25 DIAGNOSIS — F411 Generalized anxiety disorder: Secondary | ICD-10-CM

## 2021-08-28 ENCOUNTER — Other Ambulatory Visit: Payer: Self-pay | Admitting: Nurse Practitioner

## 2021-08-28 DIAGNOSIS — F411 Generalized anxiety disorder: Secondary | ICD-10-CM

## 2021-08-28 DIAGNOSIS — E119 Type 2 diabetes mellitus without complications: Secondary | ICD-10-CM

## 2021-09-16 ENCOUNTER — Ambulatory Visit (INDEPENDENT_AMBULATORY_CARE_PROVIDER_SITE_OTHER): Payer: 59

## 2021-09-16 ENCOUNTER — Ambulatory Visit
Admission: EM | Admit: 2021-09-16 | Discharge: 2021-09-16 | Disposition: A | Discharge status: Home / Self Care | Payer: 59 | Attending: Internal Medicine | Admitting: Internal Medicine

## 2021-09-16 DIAGNOSIS — M79642 Pain in left hand: Secondary | ICD-10-CM | POA: Diagnosis not present

## 2021-09-16 DIAGNOSIS — M25532 Pain in left wrist: Secondary | ICD-10-CM

## 2021-09-16 NOTE — ED Triage Notes (Signed)
Patient presents to Urgent Care with complaints of fall on tuesday with injury and swelling to L forearm/wrist . Patient reports pain in arm and tylenol for symptoms.

## 2021-09-16 NOTE — ED Provider Notes (Signed)
[facility]    CSN: [id number] Arrival date & time: 09/16/21  1634      History   Chief Complaint No chief complaint on file.   HPI Molly Payne is a 61 y.o. female.   Patient presents with left wrist and hand pain that started yesterday after a fall.  Patient reports that she was trying to step out of the shower when she tripped and fell forward with both hands outstretched.  She is only having pain in the left wrist.  Denies any pain in any other part of the body.  Patient has taken Tylenol for symptoms with some improvement.  Patient reports that she has had significant improvement in symptoms and pain since injury yesterday.  Denies any numbness or tingling.  Patient has full range of motion of hand and wrist.     Past Medical History:  Diagnosis Date   Allergy    Diabetes mellitus without complication ([place])    Hyperlipidemia     Patient Active Problem List   Diagnosis Date Noted   Sinus congestion 05/05/2021   Acute non-recurrent pansinusitis 05/05/2021   Acute cough 05/05/2021   Body mass index (BMI) of 40.1-44.9 in adult [facility]) 05/05/2021   Encounter for general adult medical examination with abnormal findings 10/14/2020   Abscess of skin of right shoulder 10/14/2020   Well woman exam 10/14/2020   Encounter to establish care 07/09/2020   Type 2 diabetes mellitus without complication, without long-term current use of insulin ([place]) 07/09/2020   Dental caries 07/09/2020   Essential hypertension 07/09/2020   GAD (generalized anxiety disorder) 07/09/2020   Agatston coronary artery calcium score less than 100 11/05/2015   Cough due to ACE inhibitor 11/05/2015   Dyslipidemia 09/20/2015   Family history of heart disease 09/20/2015   Smoker 09/20/2015   Diabetes mellitus type 2 in obese ([place]) 08/09/2014   Morbid obesity ([place]) 08/09/2014    Past Surgical History:  Procedure Laterality Date   ABDOMINAL HYSTERECTOMY     APPENDECTOMY     CHOLECYSTECTOMY       OB History   No obstetric history on file.      Home Medications    Prior to Admission medications   Medication Sig Start Date End Date Taking? Authorizing Provider  albuterol (VENTOLIN HFA) 108 (90 Base) MCG/ACT inhaler SMARTSIG:2 Puff(s) By Mouth 4 Times Daily PRN 01/29/21   [provider]  ALPRAZolam (NIRAVAM) 0.25 MG dissolvable tablet Take 1 tablet (0.25 mg total) by mouth at bedtime as needed for anxiety. 05/25/19   Maximiano Coss, NP  aspirin 81 MG tablet Take 81 mg by mouth daily.    [provider]  azithromycin (ZITHROMAX) 250 MG tablet z-pack - take as directed for 5 days 04/29/21   Ronnell Freshwater, NP  benzonatate (TESSALON) 100 MG capsule Take 1 capsule (100 mg total) by mouth 3 (three) times daily as needed. 04/29/21   Ronnell Freshwater, NP  blood glucose meter kit and supplies KIT Dispense based on patient and insurance preference. Use up to four times daily as directed. (FOR ICD-10 E11.69). 05/10/16   Shawnee Knapp, MD  chlorhexidine (PERIDEX) 0.12 % solution Use as directed 15 mLs in the mouth or throat 2 (two) times daily. 07/03/20   Ronnell Freshwater, NP  doxycycline (VIBRA-TABS) 100 MG tablet Take 1 tablet (100 mg total) by mouth 2 (two) times daily. 10/10/20   Ronnell Freshwater, NP  escitalopram (LEXAPRO) 10 MG tablet Take 1  tablet by mouth once daily 08/28/21   Ronnell Freshwater, NP  fluticasone (FLONASE) 50 MCG/ACT nasal spray Use 2 sprays each nostril twice daily for 5 days, then decrease to once daily 04/21/17   Posey Boyer, MD  Guaifenesin [facility] MAXIMUM STRENGTH) 1200 MG TB12 Take 1 tablet (1,200 mg total) by mouth every 12 (twelve) hours as needed. 12/17/17   [name] D, PA-C  ipratropium (ATROVENT) 0.03 % nasal spray Place 2 sprays into both nostrils 2 (two) times daily. 12/17/17   [name] D, PA-C  Ipratropium-Albuterol (COMBIVENT) 20-100 MCG/ACT AERS respimat Inhale 1 puff into the lungs 4 (four) times daily. 04/29/21    Ronnell Freshwater, NP  lidocaine (XYLOCAINE) 2 % solution Use as directed 5 mLs in the mouth or throat as needed for mouth pain. 12/17/16   Tereasa Coop, PA-C  losartan (COZAAR) 25 MG tablet TAKE 1 TABLET BY MOUTH ONCE DAILY NO  MORE  REFILLS  WITHOUT  OFFICE  VISIT 01/31/21   Ronnell Freshwater, NP  meloxicam (MOBIC) 7.5 MG tablet Take 1-2 tablets (7.5-15 mg total) by mouth daily as needed for pain. 06/19/17   [name] D, PA-C  metFORMIN (GLUCOPHAGE) 1000 MG tablet TAKE 1 TABLET BY MOUTH ONCE DAILY WITH BREAKFAST . APPOINTMENT REQUIRED FOR FUTURE REFILLS 08/28/21   Ronnell Freshwater, NP  rosuvastatin (CRESTOR) 40 MG tablet Take 1 tablet (40 mg total) by mouth daily. 01/31/21   Ronnell Freshwater, NP    Family History Family History  Problem Relation Age of Onset   Heart disease Mother    Breast cancer Mother    Heart disease Sister    Heart disease Sister    Heart attack Sister    Colon cancer Neg Hx    Colon polyps Neg Hx    Esophageal cancer Neg Hx    Rectal cancer Neg Hx    Stomach cancer Neg Hx     Social History Social History   Tobacco Use   Smoking status: Every Day    Packs/day: 1.00    Years: 32.00    Total pack years: 32.00    Types: Cigarettes   Smokeless tobacco: Never  Vaping Use   Vaping Use: Never used  Substance Use Topics   Alcohol use: No    Alcohol/week: 0.0 standard drinks of alcohol   Drug use: No     Allergies   Patient has no known allergies.   Review of Systems Review of Systems Per HPI  Physical Exam Triage Vital Signs ED Triage Vitals  Enc Vitals Group     BP 09/16/21 1720 (!) 150/88     Pulse Rate 09/16/21 1720 89     Resp 09/16/21 1720 18     Temp 09/16/21 1720 97.8 F (36.6 C)     Temp Source 09/16/21 1720 Oral     SpO2 09/16/21 1720 94 %     Weight --      Height --      Head Circumference --      Peak Flow --      Pain Score 09/16/21 1719 4     Pain Loc --      Pain Edu? --      Excl. in [place]? --    No data  found.  Updated Vital Signs BP (!) 150/88   Pulse 89   Temp 97.8 F (36.6 C) (Oral)   Resp 18   SpO2 94%   Visual Acuity Right  Eye Distance:   Left Eye Distance:   Bilateral Distance:    Right Eye Near:   Left Eye Near:    Bilateral Near:     Physical Exam Constitutional:      General: She is not in acute distress.    Appearance: Normal appearance. She is not toxic-appearing or diaphoretic.  HENT:     Head: Normocephalic and atraumatic.  Eyes:     Extraocular Movements: Extraocular movements intact.     Conjunctiva/sclera: Conjunctivae normal.  Pulmonary:     Effort: Pulmonary effort is normal.  Musculoskeletal:     Comments: No tenderness to palpation throughout left elbow, left forearm, left wrist, left hand, left fingers.  Patient has generalized pain with range of motion of left wrist.  Grip strength is 5/5.  Neurovascular intact.  No obvious abrasions, lacerations, discoloration, swelling noted.  Neurological:     General: No focal deficit present.     Mental Status: She is alert and oriented to person, place, and time. Mental status is at baseline.  Psychiatric:        Mood and Affect: Mood normal.        Behavior: Behavior normal.        Thought Content: Thought content normal.        Judgment: Judgment normal.      UC Treatments / Results  Labs (all labs ordered are listed, but only abnormal results are displayed) Labs Reviewed - No data to display  EKG   Radiology DG Hand Complete Left  Result Date: 09/16/2021 CLINICAL DATA:  Fall EXAM: LEFT HAND - COMPLETE 3+ VIEW COMPARISON:  None Available. FINDINGS: There is no acute fracture or dislocation. Bony alignment is normal. The joint spaces are preserved. There is no erosive change. The soft tissues are unremarkable. IMPRESSION: No acute fracture or dislocation. Electronically Signed   By: Valetta Mole M.D.   On: 09/16/2021 17:55   DG Wrist Complete Left  Result Date: 09/16/2021 CLINICAL DATA:  Fall,  pain EXAM: LEFT WRIST - COMPLETE 3+ VIEW COMPARISON:  None Available. FINDINGS: No fracture or dislocation of the left wrist. The carpus is normally aligned. Joint spaces are preserved. Coarse, dystrophic calcifications within the soft tissues overlying the distal ulna. Soft tissue edema about the ulnar forearm and wrist. IMPRESSION: 1. No fracture or dislocation of the left wrist. The carpus is normally aligned. 2. Soft tissue edema about the ulnar forearm and wrist. Electronically Signed   By: Delanna Ahmadi M.D.   On: 09/16/2021 17:48    Procedures Procedures (including critical care time)  Medications Ordered in UC Medications - No data to display  Initial Impression / Assessment and Plan / UC Course  I have reviewed the triage vital signs and the nursing notes.  Pertinent labs & imaging results that were available during my care of the patient were reviewed by me and considered in my medical decision making (see chart for details).     X-rays are negative for any acute bony abnormality.  Suspect wrist sprain.  Wrist brace was applied in urgent care.  Discussed supportive care, ice application, over-the-counter pain relievers.  Patient advised to follow-up with orthopedist if pain persists or worsens.  Discussed return precautions.  Patient verbalized understanding and was agreeable with plan. Final Clinical Impressions(s) / UC Diagnoses   Final diagnoses:  Left wrist pain     Discharge Instructions      Your x-rays were normal.  You did not break or dislocate anything.  Suspect wrist sprain.  Wrist brace has been applied.  Please use ice application as well.  Follow-up with orthopedist for further evaluation and management.    ED Prescriptions   None    PDMP not reviewed this encounter.   Teodora Medici, [place] 09/16/21 [phone]

## 2021-09-16 NOTE — Discharge Instructions (Signed)
Your x-rays were normal.  You did not break or dislocate anything.  Suspect wrist sprain.  Wrist brace has been applied.  Please use ice application as well.  Follow-up with orthopedist for further evaluation and management.

## 2021-10-11 ENCOUNTER — Other Ambulatory Visit: Payer: Self-pay | Admitting: Nurse Practitioner

## 2021-10-11 DIAGNOSIS — F411 Generalized anxiety disorder: Secondary | ICD-10-CM

## 2021-10-11 DIAGNOSIS — E119 Type 2 diabetes mellitus without complications: Secondary | ICD-10-CM

## 2021-11-30 ENCOUNTER — Other Ambulatory Visit: Payer: Self-pay | Admitting: Nurse Practitioner

## 2021-11-30 DIAGNOSIS — E119 Type 2 diabetes mellitus without complications: Secondary | ICD-10-CM

## 2021-11-30 DIAGNOSIS — I10 Essential (primary) hypertension: Secondary | ICD-10-CM

## 2021-11-30 DIAGNOSIS — F411 Generalized anxiety disorder: Secondary | ICD-10-CM

## 2021-11-30 DIAGNOSIS — E785 Hyperlipidemia, unspecified: Secondary | ICD-10-CM

## 2022-01-19 ENCOUNTER — Other Ambulatory Visit: Payer: Self-pay | Admitting: Nurse Practitioner

## 2022-01-19 DIAGNOSIS — F411 Generalized anxiety disorder: Secondary | ICD-10-CM

## 2022-01-19 DIAGNOSIS — E119 Type 2 diabetes mellitus without complications: Secondary | ICD-10-CM

## 2022-01-20 ENCOUNTER — Other Ambulatory Visit: Payer: Self-pay | Admitting: Nurse Practitioner

## 2022-01-20 DIAGNOSIS — Z1231 Encounter for screening mammogram for malignant neoplasm of breast: Secondary | ICD-10-CM

## 2022-01-21 ENCOUNTER — Ambulatory Visit
Admission: RE | Admit: 2022-01-21 | Discharge: 2022-01-21 | Disposition: A | Discharge status: Home / Self Care | Payer: 59 | Source: Ambulatory Visit | Attending: Nurse Practitioner | Admitting: Nurse Practitioner

## 2022-01-21 DIAGNOSIS — Z1231 Encounter for screening mammogram for malignant neoplasm of breast: Secondary | ICD-10-CM

## 2022-02-10 ENCOUNTER — Other Ambulatory Visit: Payer: 59

## 2022-02-13 ENCOUNTER — Other Ambulatory Visit: Payer: Self-pay | Admitting: Nurse Practitioner

## 2022-02-13 DIAGNOSIS — F411 Generalized anxiety disorder: Secondary | ICD-10-CM

## 2022-02-14 ENCOUNTER — Other Ambulatory Visit: Payer: 59

## 2022-02-17 ENCOUNTER — Ambulatory Visit: Payer: 59 | Admitting: Nurse Practitioner

## 2022-02-26 ENCOUNTER — Ambulatory Visit: Payer: 59 | Admitting: Nurse Practitioner

## 2022-02-26 ENCOUNTER — Other Ambulatory Visit: Payer: Self-pay | Admitting: Nurse Practitioner

## 2022-02-26 DIAGNOSIS — E119 Type 2 diabetes mellitus without complications: Secondary | ICD-10-CM

## 2022-03-10 ENCOUNTER — Other Ambulatory Visit: Payer: Self-pay | Admitting: Nurse Practitioner

## 2022-03-10 DIAGNOSIS — E119 Type 2 diabetes mellitus without complications: Secondary | ICD-10-CM

## 2022-03-14 NOTE — Progress Notes (Signed)
Please call patient. Normal mammogram.  Repeat in 1 year.  

## 2022-03-18 ENCOUNTER — Telehealth: Payer: Self-pay | Admitting: *Deleted

## 2022-03-18 NOTE — Telephone Encounter (Signed)
Pt called and LVM asking for refill on Metformin.  Tried to call and schedule appointment and phone only rang with no option to LVM.   If pt calls back please assist her in getting an appointment.  Looks like she had a small refill till previous appointment and did not make that appointment. Elisah Parmer Zimmerman Rumple, CMA

## 2022-03-21 ENCOUNTER — Telehealth: Payer: Self-pay

## 2022-03-21 ENCOUNTER — Other Ambulatory Visit: Payer: Self-pay

## 2022-03-21 DIAGNOSIS — E119 Type 2 diabetes mellitus without complications: Secondary | ICD-10-CM

## 2022-03-21 MED ORDER — METFORMIN HCL 1000 MG PO TABS: 1000.0000 mg | ORAL_TABLET | Freq: Every day | ORAL | 0 refills | Status: DC

## 2022-03-21 NOTE — Telephone Encounter (Signed)
Sent enough tablets to pharmacy until her upcoming appointment on 04/02/2022

## 2022-03-21 NOTE — Telephone Encounter (Signed)
Pt is calling stating that she has been out of Metformin for about 2 weeks now.   I advised the pt that an appointment would be needed and kept to get additional refills. I advised her that I would submit a request on her behalf to get a short supply of 2 weeks worth to get her to the appointment of 04/02/22.  Refill request: metFORMIN (GLUCOPHAGE) 1000 MG tablet   Pharmacy: [facility], [place] 01/31/21 ROV 04/02/22

## 2022-03-23 ENCOUNTER — Other Ambulatory Visit: Payer: Self-pay | Admitting: Nurse Practitioner

## 2022-03-23 DIAGNOSIS — F411 Generalized anxiety disorder: Secondary | ICD-10-CM

## 2022-04-02 ENCOUNTER — Ambulatory Visit (INDEPENDENT_AMBULATORY_CARE_PROVIDER_SITE_OTHER): Payer: 59 | Admitting: Nurse Practitioner

## 2022-04-02 ENCOUNTER — Encounter: Payer: Self-pay | Admitting: Nurse Practitioner

## 2022-04-02 VITALS — BP 128/80 | HR 81 | Ht 68.0 in | Wt 260.8 lb

## 2022-04-02 DIAGNOSIS — F411 Generalized anxiety disorder: Secondary | ICD-10-CM

## 2022-04-02 DIAGNOSIS — E785 Hyperlipidemia, unspecified: Secondary | ICD-10-CM

## 2022-04-02 DIAGNOSIS — Z6839 Body mass index (BMI) 39.0-39.9, adult: Secondary | ICD-10-CM

## 2022-04-02 DIAGNOSIS — F17200 Nicotine dependence, unspecified, uncomplicated: Secondary | ICD-10-CM

## 2022-04-02 DIAGNOSIS — I10 Essential (primary) hypertension: Secondary | ICD-10-CM

## 2022-04-02 DIAGNOSIS — E119 Type 2 diabetes mellitus without complications: Secondary | ICD-10-CM

## 2022-04-02 DIAGNOSIS — Z1211 Encounter for screening for malignant neoplasm of colon: Secondary | ICD-10-CM

## 2022-04-02 LAB — POCT GLYCOSYLATED HEMOGLOBIN (HGB A1C): HbA1c POC (<> result, manual entry): 6.8 % (ref 4.0–5.6)

## 2022-04-02 LAB — POCT UA - MICROALBUMIN
Albumin/Creatinine Ratio, Urine, POC: 30
Creatinine, POC: 200 mg/dL
Microalbumin Ur, POC: 30 mg/L

## 2022-04-02 MED ORDER — ESCITALOPRAM OXALATE 10 MG PO TABS: 10.0000 mg | ORAL_TABLET | Freq: Every day | ORAL | 1 refills | Status: DC

## 2022-04-02 MED ORDER — LOSARTAN POTASSIUM 25 MG PO TABS: ORAL_TABLET | ORAL | 1 refills | Status: DC

## 2022-04-02 MED ORDER — ROSUVASTATIN CALCIUM 40 MG PO TABS: 40.0000 mg | ORAL_TABLET | Freq: Every day | ORAL | 1 refills | Status: DC

## 2022-04-02 MED ORDER — METFORMIN HCL 1000 MG PO TABS: 1000.0000 mg | ORAL_TABLET | Freq: Every day | ORAL | 1 refills | Status: DC

## 2022-04-02 MED ORDER — ALBUTEROL SULFATE HFA 108 (90 BASE) MCG/ACT IN AERS: 2.0000 | INHALATION_SPRAY | Freq: Four times a day (QID) | RESPIRATORY_TRACT | 1 refills | Status: AC | PRN

## 2022-04-02 NOTE — Progress Notes (Signed)
Established patient visit   Patient: Molly Payne   DOB: 1961-03-05   61 y.o. Female  MRN: [id number] Visit Date: 04/02/2022   Chief Complaint  Patient presents with   Follow-up   Subjective    HPI  Follow up - has not been seen since 04/2021 -Type 2 diabetes  --HgbA1c 6.8 today with normal urine microalbumin  -due for colon cancer screening  -due for routine, fasting labs     Medications: Outpatient Medications Prior to Visit  Medication Sig   ALPRAZolam (NIRAVAM) 0.25 MG dissolvable tablet Take 1 tablet (0.25 mg total) by mouth at bedtime as needed for anxiety.   aspirin 81 MG tablet Take 81 mg by mouth daily.   azithromycin (ZITHROMAX) 250 MG tablet z-pack - take as directed for 5 days   blood glucose meter kit and supplies KIT Dispense based on patient and insurance preference. Use up to four times daily as directed. (FOR ICD-10 E11.69).   chlorhexidine (PERIDEX) 0.12 % solution Use as directed 15 mLs in the mouth or throat 2 (two) times daily.   doxycycline (VIBRA-TABS) 100 MG tablet Take 1 tablet (100 mg total) by mouth 2 (two) times daily.   fluticasone (FLONASE) 50 MCG/ACT nasal spray Use 2 sprays each nostril twice daily for 5 days, then decrease to once daily   ipratropium (ATROVENT) 0.03 % nasal spray Place 2 sprays into both nostrils 2 (two) times daily.   Ipratropium-Albuterol (COMBIVENT) 20-100 MCG/ACT AERS respimat Inhale 1 puff into the lungs 4 (four) times daily.   lidocaine (XYLOCAINE) 2 % solution Use as directed 5 mLs in the mouth or throat as needed for mouth pain.   [DISCONTINUED] albuterol (VENTOLIN HFA) 108 (90 Base) MCG/ACT inhaler SMARTSIG:2 Puff(s) By Mouth 4 Times Daily PRN   [DISCONTINUED] escitalopram (LEXAPRO) 10 MG tablet Take 1 tablet by mouth once daily   [DISCONTINUED] losartan (COZAAR) 25 MG tablet TAKE 1 TABLET BY MOUTH ONCE DAILY. NO MORE REFILLS WITHOUT OFFICE VISIT   [DISCONTINUED] metFORMIN (GLUCOPHAGE) 1000 MG tablet Take 1 tablet (1,000  mg total) by mouth daily with breakfast.   [DISCONTINUED] rosuvastatin (CRESTOR) 40 MG tablet Take 1 tablet by mouth once daily   [DISCONTINUED] benzonatate (TESSALON) 100 MG capsule Take 1 capsule (100 mg total) by mouth 3 (three) times daily as needed.   [DISCONTINUED] Guaifenesin (MUCINEX MAXIMUM STRENGTH) 1200 MG TB12 Take 1 tablet (1,200 mg total) by mouth every 12 (twelve) hours as needed.   [DISCONTINUED] meloxicam (MOBIC) 7.5 MG tablet Take 1-2 tablets (7.5-15 mg total) by mouth daily as needed for pain.   No facility-administered medications prior to visit.    Review of Systems  Constitutional:  Negative for activity change, appetite change, chills, fatigue and fever.  HENT:  Negative for congestion, postnasal drip, rhinorrhea, sinus pressure, sinus pain, sneezing and sore throat.   Eyes: Negative.   Respiratory:  Negative for cough, chest tightness, shortness of breath and wheezing.   Cardiovascular:  Negative for chest pain and palpitations.  Gastrointestinal:  Negative for abdominal pain, constipation, diarrhea, nausea and vomiting.  Endocrine: Negative for cold intolerance, heat intolerance, polydipsia and polyuria.       Blood sugars doing well    Genitourinary:  Negative for dyspareunia, dysuria, flank pain, frequency and urgency.  Musculoskeletal:  Negative for arthralgias, back pain and myalgias.  Skin:  Negative for rash.  Allergic/Immunologic: Negative for environmental allergies.  Neurological:  Negative for dizziness, weakness and headaches.  Hematological:  Negative for adenopathy.  Psychiatric/Behavioral:  The patient is not nervous/anxious.        Objective     Today's Vitals   04/02/22 1123  BP: 128/80  Pulse: 81  SpO2: 94%  Weight: 260 lb 12.8 oz (118.3 kg)  Height: _0  (1.727 m)   Body mass index is 39.65 kg/m.  BP Readings from Last 3 Encounters:  04/02/22 128/80  09/16/21 (Abnormal) 150/88  04/29/21 138/77    Wt Readings from Last 3  Encounters:  04/02/22 260 lb 12.8 oz (118.3 kg)  04/29/21 265 lb (120.2 kg)  01/31/21 267 lb 9.6 oz (121.4 kg)    Physical Exam Vitals and nursing note reviewed.  Constitutional:      Appearance: Normal appearance. She is well-developed.  HENT:     Head: Normocephalic and atraumatic.     Nose: Nose normal.     Mouth/Throat:     Mouth: Mucous membranes are moist.     Pharynx: Oropharynx is clear.  Eyes:     Extraocular Movements: Extraocular movements intact.     Conjunctiva/sclera: Conjunctivae normal.     Pupils: Pupils are equal, round, and reactive to light.  Cardiovascular:     Rate and Rhythm: Normal rate and regular rhythm.     Pulses: Normal pulses.     Heart sounds: Normal heart sounds.  Pulmonary:     Effort: Pulmonary effort is normal.     Breath sounds: Normal breath sounds.  Abdominal:     Palpations: Abdomen is soft.  Musculoskeletal:        General: Normal range of motion.     Cervical back: Normal range of motion and neck supple.  Lymphadenopathy:     Cervical: No cervical adenopathy.  Skin:    General: Skin is warm and dry.     Capillary Refill: Capillary refill takes less than 2 seconds.  Neurological:     General: No focal deficit present.     Mental Status: She is alert and oriented to person, place, and time.  Psychiatric:        Mood and Affect: Mood normal.        Behavior: Behavior normal.        Thought Content: Thought content normal.        Judgment: Judgment normal.     Results for orders placed or performed in visit on 04/02/22  POCT glycosylated hemoglobin (Hb A1C)  Result Value Ref Range   Hemoglobin A1C     HbA1c POC (<> result, manual entry) 6.8 4.0 - 5.6 %   HbA1c, POC (prediabetic range)     HbA1c, POC (controlled diabetic range)    POCT UA - Microalbumin  Result Value Ref Range   Microalbumin Ur, POC 30 mg/L   Creatinine, POC 200 mg/dL   Albumin/Creatinine Ratio, Urine, POC 30     Assessment & Plan    1. Type 2 diabetes  mellitus without complication, without long-term current use of insulin (HCC) HgbA1c 6.8 today with normal urine microalbumin. Continue metformin as prescribed. Recheck in 3 - 4 months. - POCT glycosylated hemoglobin (Hb A1C) - POCT UA - Microalbumin - losartan (COZAAR) 25 MG tablet; TAKE 1 TABLET BY MOUTH ONCE DAILY. NO MORE REFILLS WITHOUT OFFICE VISIT  Dispense: 90 tablet; Refill: 1 - metFORMIN (GLUCOPHAGE) 1000 MG tablet; Take 1 tablet (1,000 mg total) by mouth daily with breakfast.  Dispense: 180 tablet; Refill: 1 - rosuvastatin (CRESTOR) 40 MG tablet; Take 1 tablet (40 mg total) by mouth daily.  Dispense: 90  tablet; Refill: 1  2. Essential hypertension Stable. Continue bp medication as prescribed  - losartan (COZAAR) 25 MG tablet; TAKE 1 TABLET BY MOUTH ONCE DAILY. NO MORE REFILLS WITHOUT OFFICE VISIT  Dispense: 90 tablet; Refill: 1  3. Dyslipidemia Stable. Continue crestor as prescribed  - rosuvastatin (CRESTOR) 40 MG tablet; Take 1 tablet (40 mg total) by mouth daily.  Dispense: 90 tablet; Refill: 1  4. BMI 39.0-39.9,adult Discussed lowering calorie intake to 1500 calories per day and incorporating exercise into daily routine to help lose weight.   5. GAD (generalized anxiety disorder) Continue lexapro as prescribed  - escitalopram (LEXAPRO) 10 MG tablet; Take 1 tablet (10 mg total) by mouth daily.  Dispense: 90 tablet; Refill: 1  6. Current smoker CT lung cancer screening test ordered today.  - CT CHEST LUNG CANCER SCREENING LOW DOSE WO CONTRAST; Future  7. Screening for colon cancer .refer to GI for screening colonoscopy   - Ambulatory referral to Gastroenterology   Problem List Items Addressed This Visit       Cardiovascular and Mediastinum   Essential hypertension   Relevant Medications   losartan (COZAAR) 25 MG tablet   rosuvastatin (CRESTOR) 40 MG tablet     Endocrine   Type 2 diabetes mellitus without complication, without long-term current use of insulin (HCC)  - Primary   Relevant Medications   losartan (COZAAR) 25 MG tablet   metFORMIN (GLUCOPHAGE) 1000 MG tablet   rosuvastatin (CRESTOR) 40 MG tablet   Other Relevant Orders   POCT glycosylated hemoglobin (Hb A1C) (Completed)   POCT UA - Microalbumin (Completed)     Other   Dyslipidemia   Relevant Medications   rosuvastatin (CRESTOR) 40 MG tablet   GAD (generalized anxiety disorder)   Relevant Medications   escitalopram (LEXAPRO) 10 MG tablet   BMI 39.0-39.9,adult   Other Visit Diagnoses     Current smoker       Relevant Orders   CT CHEST LUNG CANCER SCREENING LOW DOSE WO CONTRAST   Screening for colon cancer       Relevant Orders   Ambulatory referral to Gastroenterology        Return in about 4 months (around 08/02/2022) for health maintenance exam, FBW at time of visit.         Ronnell Freshwater, NP  [facility] [phone] (phone) [phone] (fax)  [place]

## 2022-04-27 DIAGNOSIS — Z6839 Body mass index (BMI) 39.0-39.9, adult: Secondary | ICD-10-CM | POA: Insufficient documentation

## 2022-05-05 ENCOUNTER — Ambulatory Visit (AMBULATORY_SURGERY_CENTER): Payer: 59 | Admitting: *Deleted

## 2022-05-05 ENCOUNTER — Encounter: Payer: Self-pay | Admitting: Gastroenterology

## 2022-05-05 VITALS — Ht 67.0 in | Wt 268.0 lb

## 2022-05-05 DIAGNOSIS — Z86010 Personal history of colonic polyps: Secondary | ICD-10-CM

## 2022-05-05 MED ORDER — NA SULFATE-K SULFATE-MG SULF 17.5-3.13-1.6 GM/177ML PO SOLN: 1.0000 | Freq: Once | ORAL | 0 refills | Status: AC

## 2022-05-05 NOTE — Progress Notes (Signed)

## 2022-05-18 ENCOUNTER — Encounter: Payer: Self-pay | Admitting: Certified Registered Nurse Anesthetist

## 2022-05-23 ENCOUNTER — Ambulatory Visit (AMBULATORY_SURGERY_CENTER): Payer: 59 | Admitting: Gastroenterology

## 2022-05-23 ENCOUNTER — Encounter: Payer: Self-pay | Admitting: Gastroenterology

## 2022-05-23 VITALS — BP 123/76 | HR 70 | Temp 97.3°F | Resp 25 | Ht 67.0 in | Wt 268.0 lb

## 2022-05-23 DIAGNOSIS — D125 Benign neoplasm of sigmoid colon: Secondary | ICD-10-CM

## 2022-05-23 DIAGNOSIS — Z86010 Personal history of colonic polyps: Secondary | ICD-10-CM

## 2022-05-23 DIAGNOSIS — Z09 Encounter for follow-up examination after completed treatment for conditions other than malignant neoplasm: Secondary | ICD-10-CM | POA: Diagnosis present

## 2022-05-23 DIAGNOSIS — K5140 Inflammatory polyps of colon without complications: Secondary | ICD-10-CM

## 2022-05-23 DIAGNOSIS — D123 Benign neoplasm of transverse colon: Secondary | ICD-10-CM

## 2022-05-23 MED ORDER — SODIUM CHLORIDE 0.9 % IV SOLN: 500.0000 mL | Freq: Once | INTRAVENOUS | Status: DC

## 2022-05-23 NOTE — Progress Notes (Signed)
[facility] History and Physical   Primary Care Physician:  Ronnell Freshwater, NP   Reason for Procedure:   History of colon polyps - 5 adenomas removed 02/2017  Plan:    colonoscopy     HPI: Molly Payne is a 62 y.o. female  here for colonoscopy surveillance - history of 5 adenomas in 2018.   Patient denies any bowel symptoms at this time. No family history of colon cancer known. Otherwise feels well without any cardiopulmonary symptoms.   I have discussed risks / benefits of anesthesia and endoscopic procedure with [name] and they wish to proceed with the exams as outlined today.    Past Medical History:  Diagnosis Date   Allergy    seasonal   Anxiety    Diabetes mellitus without complication ([place])    Hyperlipidemia     Past Surgical History:  Procedure Laterality Date   ABDOMINAL HYSTERECTOMY     partial   APPENDECTOMY     CHOLECYSTECTOMY     COLONOSCOPY      Prior to Admission medications   Medication Sig Start Date End Date Taking? Authorizing Provider  aspirin 81 MG tablet Take 81 mg by mouth daily.   Yes [provider]  escitalopram (LEXAPRO) 10 MG tablet Take 1 tablet (10 mg total) by mouth daily. 04/02/22  Yes Boscia, Heather E, NP  losartan (COZAAR) 25 MG tablet TAKE 1 TABLET BY MOUTH ONCE DAILY. NO MORE REFILLS WITHOUT OFFICE VISIT 04/02/22  Yes Ronnell Freshwater, NP  metFORMIN (GLUCOPHAGE) 1000 MG tablet Take 1 tablet (1,000 mg total) by mouth daily with breakfast. 04/02/22  Yes Boscia, Greer Ee, NP  OVER THE COUNTER MEDICATION daily. Air shield take one   Yes [provider]  rosuvastatin (CRESTOR) 40 MG tablet Take 1 tablet (40 mg total) by mouth daily. 04/02/22  Yes Boscia, Greer Ee, NP  albuterol (VENTOLIN HFA) 108 (90 Base) MCG/ACT inhaler Inhale 2 puffs into the lungs every 6 (six) hours as needed for wheezing or shortness of breath. 04/02/22   Ronnell Freshwater, NP  ALPRAZolam (NIRAVAM) 0.25 MG dissolvable  tablet Take 1 tablet (0.25 mg total) by mouth at bedtime as needed for anxiety. 05/25/19   Maximiano Coss, NP  blood glucose meter kit and supplies KIT Dispense based on patient and insurance preference. Use up to four times daily as directed. (FOR ICD-10 E11.69). Patient not taking: Reported on 05/05/2022 05/10/16   Shawnee Knapp, MD  chlorhexidine (PERIDEX) 0.12 % solution Use as directed 15 mLs in the mouth or throat 2 (two) times daily. Patient not taking: Reported on 05/05/2022 07/03/20   Ronnell Freshwater, NP  fluticasone [facility]) 50 MCG/ACT nasal spray Use 2 sprays each nostril twice daily for 5 days, then decrease to once daily 04/21/17   Posey Boyer, MD  Ipratropium-Albuterol (COMBIVENT) 20-100 MCG/ACT AERS respimat Inhale 1 puff into the lungs 4 (four) times daily. Patient not taking: Reported on 05/05/2022 04/29/21   Ronnell Freshwater, NP    Current Outpatient Medications  Medication Sig Dispense Refill   aspirin 81 MG tablet Take 81 mg by mouth daily.     escitalopram (LEXAPRO) 10 MG tablet Take 1 tablet (10 mg total) by mouth daily. 90 tablet 1   losartan (COZAAR) 25 MG tablet TAKE 1 TABLET BY MOUTH ONCE DAILY. NO MORE REFILLS WITHOUT OFFICE VISIT 90 tablet 1   metFORMIN (GLUCOPHAGE) 1000 MG tablet Take 1 tablet (1,000 mg total) by mouth  daily with breakfast. 180 tablet 1   OVER THE COUNTER MEDICATION daily. Air shield take one     rosuvastatin (CRESTOR) 40 MG tablet Take 1 tablet (40 mg total) by mouth daily. 90 tablet 1   albuterol (VENTOLIN HFA) 108 (90 Base) MCG/ACT inhaler Inhale 2 puffs into the lungs every 6 (six) hours as needed for wheezing or shortness of breath. 54 g 1   ALPRAZolam (NIRAVAM) 0.25 MG dissolvable tablet Take 1 tablet (0.25 mg total) by mouth at bedtime as needed for anxiety. 30 tablet 0   blood glucose meter kit and supplies KIT Dispense based on patient and insurance preference. Use up to four times daily as directed. (FOR ICD-10 E11.69). (Patient not taking:  Reported on 05/05/2022) 1 each 0   chlorhexidine (PERIDEX) 0.12 % solution Use as directed 15 mLs in the mouth or throat 2 (two) times daily. (Patient not taking: Reported on 05/05/2022) 120 mL 1   fluticasone (FLONASE) 50 MCG/ACT nasal spray Use 2 sprays each nostril twice daily for 5 days, then decrease to once daily 16 g 1   Ipratropium-Albuterol (COMBIVENT) 20-100 MCG/ACT AERS respimat Inhale 1 puff into the lungs 4 (four) times daily. (Patient not taking: Reported on 05/05/2022) 4 g 3   Current Facility-Administered Medications  Medication Dose Route Frequency Provider Last Rate Last Admin   0.9 %  sodium chloride infusion  500 mL Intravenous Once Jadie Comas, Carlota Raspberry, MD        Allergies as of 05/23/2022   (No Known Allergies)    Family History  Problem Relation Age of Onset   Heart disease Mother    Breast cancer Mother    Heart disease Sister    Heart disease Sister    Heart attack Sister    Colon cancer Neg Hx    Colon polyps Neg Hx    Esophageal cancer Neg Hx    Rectal cancer Neg Hx    Stomach cancer Neg Hx    Crohn's disease Neg Hx    Ulcerative colitis Neg Hx     Social History   Socioeconomic History   Marital status: Married    Spouse name: Not on file   Number of children: 2   Years of education: 12   Highest education level: Not on file  Occupational History   Occupation: customer service  Tobacco Use   Smoking status: Every Day    Packs/day: 1.00    Years: 32.00    Total pack years: 32.00    Types: Cigarettes   Smokeless tobacco: Never  Vaping Use   Vaping Use: Never used  Substance and Sexual Activity   Alcohol use: No    Alcohol/week: 0.0 standard drinks of alcohol   Drug use: No   Sexual activity: Not Currently    Partners: Male  Other Topics Concern   Not on file  Social History Narrative   epworth sleepiness scale = 3 (09/20/15)   Social Determinants of Health   Financial Resource Strain: Not on file  Food Insecurity: Not on file   Transportation Needs: Not on file  Physical Activity: Not on file  Stress: Not on file  Social Connections: Not on file  Intimate Partner Violence: Not on file    Review of Systems: All other review of systems negative except as mentioned in the HPI.  Physical Exam: Vital signs BP (!) 149/83   Pulse 84   Temp (!) 97.3 F (36.3 C)   Ht 5' 7"$  (1.702 m)  Wt 268 lb (121.6 kg)   SpO2 96%   BMI 41.97 kg/m   General:   Alert,  Well-developed, pleasant and cooperative in NAD Lungs:  Clear throughout to auscultation.   Heart:  Regular rate and rhythm Abdomen:  Soft, nontender and nondistended.   Neuro/Psych:  Alert and cooperative. Normal mood and affect. A and O x 3  Jolly Mango, MD [facility]

## 2022-05-23 NOTE — Op Note (Signed)
[facility] Patient Name: Molly Payne Procedure Date: 05/23/2022 9:36 AM MRN: [id number] Endoscopist: Remo Lipps P. [place] Molly Payne , Molly Payne, [id number] Age: 62 Referring Molly Payne:  Date of Birth: 1960/11/10 Gender: Female Account #: [id number] Procedure:                Colonoscopy Indications:              High risk colon cancer surveillance: Personal                            history of colonic polyps - 5 adenomas removed                            02/2017 Medicines:                Monitored Anesthesia Care Procedure:                Pre-Anesthesia Assessment:                           - Prior to the procedure, a History and Physical                            was performed, and patient medications and                            allergies were reviewed. The patient's tolerance of                            previous anesthesia was also reviewed. The risks                            and benefits of the procedure and the sedation                            options and risks were discussed with the patient.                            All questions were answered, and informed consent                            was obtained. Prior Anticoagulants: The patient has                            taken no anticoagulant or antiplatelet agents. ASA                            Grade Assessment: III - A patient with severe                            systemic disease. After reviewing the risks and                            benefits, the patient was deemed in satisfactory  condition to undergo the procedure.                           After obtaining informed consent, the colonoscope                            was passed under direct vision. Throughout the                            procedure, the patient's blood pressure, pulse, and                            oxygen saturations were monitored continuously. The                            CF [id number] was introduced through the  anus                            and advanced to the the cecum, identified by                            appendiceal orifice and ileocecal valve. The                            colonoscopy was performed without difficulty. The                            patient tolerated the procedure well. The quality                            of the bowel preparation was good. The ileocecal                            valve, appendiceal orifice, and rectum were                            photographed. Scope In: 9:41:57 AM Scope Out: 9:55:53 AM Scope Withdrawal Time: 0 hours 10 minutes 8 seconds  Total Procedure Duration: 0 hours 13 minutes 56 seconds  Findings:                 The perianal and digital rectal examinations were                            normal.                           A 4 mm polyp was found in the transverse colon. The                            polyp was sessile. The polyp was removed with a                            cold snare. Resection and retrieval were complete.  A 2 mm polyp was found in the sigmoid colon. The                            polyp was sessile. The polyp was removed with a                            cold snare. Resection and retrieval were complete.                           Internal hemorrhoids were found during retroflexion.                           Anal papilla(e) were hypertrophied.                           The exam was otherwise without abnormality. Complications:            No immediate complications. Estimated blood loss:                            Minimal. Estimated Blood Loss:     Estimated blood loss was minimal. Impression:               - One 4 mm polyp in the transverse colon, removed                            with a cold snare. Resected and retrieved.                           - One 2 mm polyp in the sigmoid colon, removed with                            a cold snare. Resected and retrieved.                           -  Internal hemorrhoids.                           - Anal papilla(e) were hypertrophied.                           - The examination was otherwise normal. Recommendation:           - Patient has a contact number available for                            emergencies. The signs and symptoms of potential                            delayed complications were discussed with the                            patient. Return to normal activities tomorrow.  Written discharge instructions were provided to the                            patient.                           - Resume previous diet.                           - Continue present medications.                           - Await pathology results. Remo Lipps P. Devontaye Ground, Molly Payne 05/23/2022 9:59:39 AM This report has been signed electronically.

## 2022-05-23 NOTE — Progress Notes (Signed)
Vital signs checked by:DT  The patient states no changes in medical or surgical history since pre-visit screening on 05/05/2022.

## 2022-05-23 NOTE — Patient Instructions (Signed)
Handouts on polyps and hemorrhoids given to you today  Await pathology results   YOU HAD AN ENDOSCOPIC PROCEDURE TODAY AT [facility]:   Refer to the procedure report that was given to you for any specific questions about what was found during the examination.  If the procedure report does not answer your questions, please call your gastroenterologist to clarify.  If you requested that your care partner not be given the details of your procedure findings, then the procedure report has been included in a sealed envelope for you to review at your convenience later.  YOU SHOULD EXPECT: Some feelings of bloating in the abdomen. Passage of more gas than usual.  Walking can help get rid of the air that was put into your GI tract during the procedure and reduce the bloating. If you had a lower endoscopy (such as a colonoscopy or flexible sigmoidoscopy) you may notice spotting of blood in your stool or on the toilet paper. If you underwent a bowel prep for your procedure, you may not have a normal bowel movement for a few days.  Please Note:  You might notice some irritation and congestion in your nose or some drainage.  This is from the oxygen used during your procedure.  There is no need for concern and it should clear up in a day or so.  SYMPTOMS TO REPORT IMMEDIATELY:  Following lower endoscopy (colonoscopy or flexible sigmoidoscopy):  Excessive amounts of blood in the stool  Significant tenderness or worsening of abdominal pains  Swelling of the abdomen that is new, acute  Fever of 100F or higher  For urgent or emergent issues, a gastroenterologist can be reached at any hour by calling [phone]. Do not use MyChart messaging for urgent concerns.    DIET:  We do recommend a small meal at first, but then you may proceed to your regular diet.  Drink plenty of fluids but you should avoid alcoholic beverages for 24 hours.  ACTIVITY:  You should plan to take it easy for the rest  of today and you should NOT DRIVE or use heavy machinery until tomorrow (because of the sedation medicines used during the test).    FOLLOW UP: Our staff will call the number listed on your records the next business day following your procedure.  We will call around 7:15- 8:00 am to check on you and address any questions or concerns that you may have regarding the information given to you following your procedure. If we do not reach you, we will leave a message.     If any biopsies were taken you will be contacted by phone or by letter within the next 1-3 weeks.  Please call us at [phone] if you have not heard about the biopsies in 3 weeks.    SIGNATURES/CONFIDENTIALITY: You and/or your care partner have signed paperwork which will be entered into your electronic medical record.  These signatures attest to the fact that that the information above on your After Visit Summary has been reviewed and is understood.  Full responsibility of the confidentiality of this discharge information lies with you and/or your care-partner.

## 2022-05-23 NOTE — Progress Notes (Signed)
Report given to PACU, vss 

## 2022-05-26 ENCOUNTER — Telehealth: Payer: Self-pay | Admitting: *Deleted

## 2022-05-26 NOTE — Telephone Encounter (Signed)
  Follow up Call-     05/23/2022    8:32 AM  Call back number  Post procedure Call Back phone  # [phone]  Permission to leave phone message Yes     Patient questions:  Do you have a fever, pain , or abdominal swelling? No. Pain Score  0 *  Have you tolerated food without any problems? Yes.    Have you been able to return to your normal activities? Yes.    Do you have any questions about your discharge instructions: Diet   No. Medications  No. Follow up visit  No.  Do you have questions or concerns about your Care? No.  Actions: * If pain score is 4 or above: No action needed, pain <4.

## 2022-07-11 ENCOUNTER — Other Ambulatory Visit: Payer: Self-pay | Admitting: Nurse Practitioner

## 2022-07-11 DIAGNOSIS — E785 Hyperlipidemia, unspecified: Secondary | ICD-10-CM

## 2022-07-11 DIAGNOSIS — I10 Essential (primary) hypertension: Secondary | ICD-10-CM

## 2022-07-11 DIAGNOSIS — Z0000 Encounter for general adult medical examination without abnormal findings: Secondary | ICD-10-CM

## 2022-07-11 DIAGNOSIS — E119 Type 2 diabetes mellitus without complications: Secondary | ICD-10-CM

## 2022-07-23 ENCOUNTER — Encounter: Payer: Self-pay | Admitting: Nurse Practitioner

## 2022-07-23 ENCOUNTER — Ambulatory Visit (INDEPENDENT_AMBULATORY_CARE_PROVIDER_SITE_OTHER): Payer: 59 | Admitting: Nurse Practitioner

## 2022-07-23 VITALS — BP 130/81 | HR 71 | Ht 67.0 in | Wt 268.4 lb

## 2022-07-23 DIAGNOSIS — L03116 Cellulitis of left lower limb: Secondary | ICD-10-CM

## 2022-07-23 MED ORDER — AMOXICILLIN-POT CLAVULANATE 400-57 MG/5ML PO SUSR: 800.0000 mg | Freq: Two times a day (BID) | ORAL | 0 refills | Status: DC

## 2022-07-23 NOTE — Progress Notes (Signed)
Established patient visit   Patient: Molly Payne   DOB: 03-25-1961   62 y.o. Female  MRN: [id number] Visit Date: 07/23/2022  Chief Complaint  Patient presents with   Leg Injury   Subjective    Leg Pain  The incident occurred more than 1 week ago. The incident occurred at home. The injury mechanism was a direct blow. The pain is present in the left ankle and left leg. The quality of the pain is described as aching. The pain is mild. The pain has been Improving since onset. She reports no foreign bodies present. The symptoms are aggravated by palpation. She has tried ice for the symptoms. The treatment provided mild relief.      Medications: Outpatient Medications Prior to Visit  Medication Sig   albuterol (VENTOLIN HFA) 108 (90 Base) MCG/ACT inhaler Inhale 2 puffs into the lungs every 6 (six) hours as needed for wheezing or shortness of breath.   ALPRAZolam (NIRAVAM) 0.25 MG dissolvable tablet Take 1 tablet (0.25 mg total) by mouth at bedtime as needed for anxiety.   aspirin 81 MG tablet Take 81 mg by mouth daily.   escitalopram (LEXAPRO) 10 MG tablet Take 1 tablet (10 mg total) by mouth daily.   fluticasone (FLONASE) 50 MCG/ACT nasal spray Use 2 sprays each nostril twice daily for 5 days, then decrease to once daily   losartan (COZAAR) 25 MG tablet TAKE 1 TABLET BY MOUTH ONCE DAILY. NO MORE REFILLS WITHOUT OFFICE VISIT   metFORMIN (GLUCOPHAGE) 1000 MG tablet Take 1 tablet (1,000 mg total) by mouth daily with breakfast.   OVER THE COUNTER MEDICATION daily. Air shield take one   rosuvastatin (CRESTOR) 40 MG tablet Take 1 tablet (40 mg total) by mouth daily.   blood glucose meter kit and supplies KIT Dispense based on patient and insurance preference. Use up to four times daily as directed. (FOR ICD-10 E11.69). (Patient not taking: Reported on 05/05/2022)   chlorhexidine (PERIDEX) 0.12 % solution Use as directed 15 mLs in the mouth or throat 2 (two) times daily. (Patient not taking:  Reported on 05/05/2022)   Ipratropium-Albuterol (COMBIVENT) 20-100 MCG/ACT AERS respimat Inhale 1 puff into the lungs 4 (four) times daily. (Patient not taking: Reported on 05/05/2022)   No facility-administered medications prior to visit.    Review of Systems See HPI     Objective     Today's Vitals   07/23/22 1509  BP: 130/81  Pulse: 71  SpO2: 97%  Weight: 268 lb 6.4 oz (121.7 kg)  Height: 5\' 7"  (1.702 m)   Body mass index is 42.04 kg/m.   Physical Exam Vitals and nursing note reviewed.  Constitutional:      Appearance: Normal appearance. She is well-developed.  HENT:     Head: Normocephalic and atraumatic.     Nose: Nose normal.     Mouth/Throat:     Mouth: Mucous membranes are moist.     Pharynx: Oropharynx is clear.  Eyes:     Extraocular Movements: Extraocular movements intact.     Conjunctiva/sclera: Conjunctivae normal.     Pupils: Pupils are equal, round, and reactive to light.  Neck:     Vascular: No carotid bruit.  Cardiovascular:     Rate and Rhythm: Normal rate and regular rhythm.     Pulses: Normal pulses.     Heart sounds: Normal heart sounds.  Pulmonary:     Effort: Pulmonary effort is normal.     Breath sounds: Normal breath sounds.  Abdominal:  Palpations: Abdomen is soft.  Musculoskeletal:        General: Normal range of motion.     Cervical back: Normal range of motion and neck supple.     Left lower leg: Edema present.  Lymphadenopathy:     Cervical: No cervical adenopathy.  Skin:    General: Skin is warm and dry.     Capillary Refill: Capillary refill takes less than 2 seconds.     Findings: Erythema present.  Neurological:     General: No focal deficit present.     Mental Status: She is alert and oriented to person, place, and time.  Psychiatric:        Mood and Affect: Mood normal.        Behavior: Behavior normal.        Thought Content: Thought content normal.        Judgment: Judgment normal.      Assessment & Plan      Cellulitis of left lower extremity Assessment & Plan: Start augmentin 800 mg bid for next 10 days.  -keep leg elevated when possible.  -take tylenol as needed for pain.  -advised her to keep close eye on area of redness and swelling. Contact the office if area gets larger rather than smaller.  -she voiced understanding and agreement with the plan.   Orders: -     Amoxicillin-Pot Clavulanate; Take 10 mLs (800 mg total) by mouth 2 (two) times daily.  Dispense: 200 mL; Refill: 0     Return for as scheduled.        Carlean Jews, NP  [facility] at [facility] [phone] (phone) [phone] (fax)  [facility]

## 2022-07-30 ENCOUNTER — Other Ambulatory Visit: Payer: 59

## 2022-07-30 DIAGNOSIS — I10 Essential (primary) hypertension: Secondary | ICD-10-CM

## 2022-07-30 DIAGNOSIS — Z0000 Encounter for general adult medical examination without abnormal findings: Secondary | ICD-10-CM

## 2022-07-30 DIAGNOSIS — E785 Hyperlipidemia, unspecified: Secondary | ICD-10-CM

## 2022-07-30 DIAGNOSIS — E119 Type 2 diabetes mellitus without complications: Secondary | ICD-10-CM

## 2022-07-31 LAB — CBC WITH DIFFERENTIAL/PLATELET
Basophils Absolute: 0.1 10*3/uL (ref 0.0–0.2)
Basos: 1 %
EOS (ABSOLUTE): 0.1 10*3/uL (ref 0.0–0.4)
Eos: 2 %
Hematocrit: 49 % — ABNORMAL HIGH (ref 34.0–46.6)
Hemoglobin: 16.2 g/dL — ABNORMAL HIGH (ref 11.1–15.9)
Immature Grans (Abs): 0.1 10*3/uL (ref 0.0–0.1)
Immature Granulocytes: 1 %
Lymphocytes Absolute: 1.8 10*3/uL (ref 0.7–3.1)
Lymphs: 24 %
MCH: 30.5 pg (ref 26.6–33.0)
MCHC: 33.1 g/dL (ref 31.5–35.7)
MCV: 92 fL (ref 79–97)
Monocytes Absolute: 0.6 10*3/uL (ref 0.1–0.9)
Monocytes: 8 %
Neutrophils Absolute: 4.9 10*3/uL (ref 1.4–7.0)
Neutrophils: 64 %
Platelets: 117 10*3/uL — ABNORMAL LOW (ref 150–450)
RBC: 5.32 x10E6/uL — ABNORMAL HIGH (ref 3.77–5.28)
RDW: 12.2 % (ref 11.7–15.4)
WBC: 7.6 10*3/uL (ref 3.4–10.8)

## 2022-07-31 LAB — COMPREHENSIVE METABOLIC PANEL
ALT: 22 IU/L (ref 0–32)
AST: 38 IU/L (ref 0–40)
Albumin/Globulin Ratio: 1.9 (ref 1.2–2.2)
Albumin: 4.1 g/dL (ref 3.8–4.9)
Alkaline Phosphatase: 99 IU/L (ref 44–121)
BUN/Creatinine Ratio: 11 — ABNORMAL LOW (ref 12–28)
BUN: 8 mg/dL (ref 8–27)
Bilirubin Total: 0.6 mg/dL (ref 0.0–1.2)
CO2: 23 mmol/L (ref 20–29)
Calcium: 9.1 mg/dL (ref 8.7–10.3)
Chloride: 105 mmol/L (ref 96–106)
Creatinine, Ser: 0.72 mg/dL (ref 0.57–1.00)
Globulin, Total: 2.2 g/dL (ref 1.5–4.5)
Glucose: 125 mg/dL — ABNORMAL HIGH (ref 70–99)
Potassium: 4.4 mmol/L (ref 3.5–5.2)
Sodium: 141 mmol/L (ref 134–144)
Total Protein: 6.3 g/dL (ref 6.0–8.5)
eGFR: 96 mL/min/{1.73_m2} (ref >59)

## 2022-07-31 LAB — HEMOGLOBIN A1C
Est. average glucose Bld gHb Est-mCnc: 169 mg/dL
Hgb A1c MFr Bld: 7.5 % — ABNORMAL HIGH (ref 4.8–5.6)

## 2022-07-31 LAB — LIPID PANEL
Chol/HDL Ratio: 4.1 ratio (ref 0.0–4.4)
Cholesterol, Total: 130 mg/dL (ref 100–199)
HDL: 32 mg/dL — ABNORMAL LOW (ref >39)
LDL Chol Calc (NIH): 65 mg/dL (ref 0–99)
Triglycerides: 196 mg/dL — ABNORMAL HIGH (ref 0–149)
VLDL Cholesterol Cal: 33 mg/dL (ref 5–40)

## 2022-07-31 LAB — TSH: TSH: 1.46 u[IU]/mL (ref 0.450–4.500)

## 2022-08-06 ENCOUNTER — Encounter: Payer: 59 | Admitting: Nurse Practitioner

## 2022-08-21 DIAGNOSIS — L03116 Cellulitis of left lower limb: Secondary | ICD-10-CM | POA: Insufficient documentation

## 2022-08-21 NOTE — Assessment & Plan Note (Signed)
Start augmentin 800 mg bid for next 10 days.  -keep leg elevated when possible.  -take tylenol as needed for pain.  -advised her to keep close eye on area of redness and swelling. Contact the office if area gets larger rather than smaller.  -she voiced understanding and agreement with the plan.

## 2022-09-05 ENCOUNTER — Ambulatory Visit (INDEPENDENT_AMBULATORY_CARE_PROVIDER_SITE_OTHER): Payer: 59 | Admitting: Nurse Practitioner

## 2022-09-05 ENCOUNTER — Encounter: Payer: Self-pay | Admitting: Nurse Practitioner

## 2022-09-05 VITALS — BP 127/80 | HR 66 | Ht 67.0 in | Wt 267.5 lb

## 2022-09-05 DIAGNOSIS — E1165 Type 2 diabetes mellitus with hyperglycemia: Secondary | ICD-10-CM | POA: Diagnosis not present

## 2022-09-05 DIAGNOSIS — F411 Generalized anxiety disorder: Secondary | ICD-10-CM

## 2022-09-05 DIAGNOSIS — E1169 Type 2 diabetes mellitus with other specified complication: Secondary | ICD-10-CM | POA: Diagnosis not present

## 2022-09-05 DIAGNOSIS — E785 Hyperlipidemia, unspecified: Secondary | ICD-10-CM

## 2022-09-05 DIAGNOSIS — Z7984 Long term (current) use of oral hypoglycemic drugs: Secondary | ICD-10-CM

## 2022-09-05 DIAGNOSIS — Z0001 Encounter for general adult medical examination with abnormal findings: Secondary | ICD-10-CM | POA: Diagnosis not present

## 2022-09-05 MED ORDER — TIRZEPATIDE 2.5 MG/0.5ML ~~LOC~~ SOAJ: 2.5000 mg | SUBCUTANEOUS | 1 refills | Status: DC

## 2022-09-05 NOTE — Progress Notes (Signed)
Complete physical exam   Patient: Molly Payne   DOB: [birth year]   62 y.o. Female  MRN: [id number] Visit Date: 09/05/2022    Chief Complaint  Patient presents with   Annual Exam   Subjective    Molly Payne is a 63 y.o. female who presents today for a complete physical exam.  She reports consuming a diabetic, 1500 calorie diet. The patient does not participate in regular exercise at present. She generally feels well. She does have additional problems to discuss today.   HPI  Annual physical  -DM2 --blood sugars recently increased to 7.5, up from 6.8  --weight gain of 9 pounds --continues to take metformin as prescribed .  Blood pressure good  Recent fasting labs  --mild elevation of triglycerides with remaining lipid panel normal.   -She denies chest pain, chest pressure, or shortness of breath. She denies headaches or visual disturbances. She denies abdominal pain, nausea, vomiting, or changes in bowel or bladder habits.    Past Medical History:  Diagnosis Date   Allergy    seasonal   Anxiety    Diabetes mellitus without complication (HCC)    Hyperlipidemia    Past Surgical History:  Procedure Laterality Date   ABDOMINAL HYSTERECTOMY     partial   APPENDECTOMY     CHOLECYSTECTOMY     COLONOSCOPY     Social History   Socioeconomic History   Marital status: Married    Spouse name: Not on file   Number of children: 2   Years of education: 12   Highest education level: Not on file  Occupational History   Occupation: customer service  Tobacco Use   Smoking status: Every Day    Packs/day: 1.00    Years: 32.00    Additional pack years: 0.00    Total pack years: 32.00    Types: Cigarettes   Smokeless tobacco: Never  Vaping Use   Vaping Use: Never used  Substance and Sexual Activity   Alcohol use: No    Alcohol/week: 0.0 standard drinks of alcohol   Drug use: No   Sexual activity: Not Currently    Partners: Male  Other Topics Concern   Not on file   Social History Narrative   epworth sleepiness scale = 3 (09/20/15)   Social Determinants of Health   Financial Resource Strain: Not on file  Food Insecurity: Not on file  Transportation Needs: Not on file  Physical Activity: Not on file  Stress: Not on file  Social Connections: Not on file  Intimate Partner Violence: Not on file   Family Status  Relation Name Status   Mother  Alive   Father  Deceased at age 6   Sister  Alive   Sister  Deceased at age 54       MI   Brother  Alive   MGM  Deceased   MGF  Deceased   PGM  Deceased   PGF  Deceased   Daughter  Alive   Daughter  Alive   Neg Hx  (Not Specified)   Family History  Problem Relation Age of Onset   Heart disease Mother    Breast cancer Mother    Heart disease Sister    Heart disease Sister    Heart attack Sister    Colon cancer Neg Hx    Colon polyps Neg Hx    Esophageal cancer Neg Hx    Rectal cancer Neg Hx    Stomach cancer Neg Hx  Crohn's disease Neg Hx    Ulcerative colitis Neg Hx    No Known Allergies  Patient Care Team: Carlean Jews, NP as PCP - General (Family Medicine) Chrystie Nose, MD as PCP - Cardiology (Cardiology)   Medications: Outpatient Medications Prior to Visit  Medication Sig   albuterol (VENTOLIN HFA) 108 (90 Base) MCG/ACT inhaler Inhale 2 puffs into the lungs every 6 (six) hours as needed for wheezing or shortness of breath.   ALPRAZolam (NIRAVAM) 0.25 MG dissolvable tablet Take 1 tablet (0.25 mg total) by mouth at bedtime as needed for anxiety.   amoxicillin-clavulanate (AUGMENTIN) 400-57 MG/5ML suspension Take 10 mLs (800 mg total) by mouth 2 (two) times daily.   aspirin 81 MG tablet Take 81 mg by mouth daily.   escitalopram (LEXAPRO) 10 MG tablet Take 1 tablet (10 mg total) by mouth daily.   fluticasone (FLONASE) 50 MCG/ACT nasal spray Use 2 sprays each nostril twice daily for 5 days, then decrease to once daily   losartan (COZAAR) 25 MG tablet TAKE 1 TABLET BY MOUTH  ONCE DAILY. NO MORE REFILLS WITHOUT OFFICE VISIT   metFORMIN (GLUCOPHAGE) 1000 MG tablet Take 1 tablet (1,000 mg total) by mouth daily with breakfast.   OVER THE COUNTER MEDICATION daily. Air shield take one   rosuvastatin (CRESTOR) 40 MG tablet Take 1 tablet (40 mg total) by mouth daily.   blood glucose meter kit and supplies KIT Dispense based on patient and insurance preference. Use up to four times daily as directed. (FOR ICD-10 E11.69). (Patient not taking: Reported on 05/05/2022)   chlorhexidine (PERIDEX) 0.12 % solution Use as directed 15 mLs in the mouth or throat 2 (two) times daily. (Patient not taking: Reported on 05/05/2022)   Ipratropium-Albuterol (COMBIVENT) 20-100 MCG/ACT AERS respimat Inhale 1 puff into the lungs 4 (four) times daily. (Patient not taking: Reported on 05/05/2022)   No facility-administered medications prior to visit.    Review of Systems See HPI    Last CBC Lab Results  Component Value Date   WBC 7.6 07/30/2022   HGB 16.2 (H) 07/30/2022   HCT 49.0 (H) 07/30/2022   MCV 92 07/30/2022   MCH 30.5 07/30/2022   RDW 12.2 07/30/2022   PLT 117 (L) 07/30/2022   Last metabolic panel Lab Results  Component Value Date   GLUCOSE 125 (H) 07/30/2022   NA 141 07/30/2022   K 4.4 07/30/2022   CL 105 07/30/2022   CO2 23 07/30/2022   BUN 8 07/30/2022   CREATININE 0.72 07/30/2022   EGFR 96 07/30/2022   CALCIUM 9.1 07/30/2022   PROT 6.3 07/30/2022   ALBUMIN 4.1 07/30/2022   LABGLOB 2.2 07/30/2022   AGRATIO 1.9 07/30/2022   BILITOT 0.6 07/30/2022   ALKPHOS 99 07/30/2022   AST 38 07/30/2022   ALT 22 07/30/2022   ANIONGAP 10 07/08/2015   Last lipids Lab Results  Component Value Date   CHOL 130 07/30/2022   HDL 32 (L) 07/30/2022   LDLCALC 65 07/30/2022   TRIG 196 (H) 07/30/2022   CHOLHDL 4.1 07/30/2022   Last hemoglobin A1c Lab Results  Component Value Date   HGBA1C 7.5 (H) 07/30/2022   Last thyroid functions Lab Results  Component Value Date   TSH  1.460 07/30/2022        Objective     Today's Vitals   09/05/22 0922  BP: 127/80  Pulse: 66  SpO2: 96%  Weight: 267 lb 8 oz (121.3 kg)  Height: 5\' 7"  (1.702 m)  Body mass index is 41.9 kg/m.  BP Readings from Last 3 Encounters:  09/05/22 127/80  07/23/22 130/81  05/23/22 123/76    Wt Readings from Last 3 Encounters:  09/05/22 267 lb 8 oz (121.3 kg)  07/23/22 268 lb 6.4 oz (121.7 kg)  05/23/22 268 lb (121.6 kg)     Physical Exam Vitals and nursing note reviewed.  Constitutional:      Appearance: Normal appearance. She is well-developed. She is obese.  HENT:     Head: Normocephalic and atraumatic.     Right Ear: Tympanic membrane, ear canal and external ear normal.     Left Ear: Tympanic membrane, ear canal and external ear normal.     Nose: Nose normal.     Mouth/Throat:     Mouth: Mucous membranes are moist.     Pharynx: Oropharynx is clear.  Eyes:     Extraocular Movements: Extraocular movements intact.     Conjunctiva/sclera: Conjunctivae normal.     Pupils: Pupils are equal, round, and reactive to light.  Neck:     Vascular: No carotid bruit.  Cardiovascular:     Rate and Rhythm: Normal rate and regular rhythm.     Pulses: Normal pulses.     Heart sounds: Normal heart sounds.  Pulmonary:     Effort: Pulmonary effort is normal.     Breath sounds: Normal breath sounds.  Abdominal:     General: Bowel sounds are normal. There is no distension.     Palpations: Abdomen is soft. There is no mass.     Tenderness: There is no abdominal tenderness. There is no right CVA tenderness, left CVA tenderness, guarding or rebound.     Hernia: No hernia is present.  Musculoskeletal:        General: Normal range of motion.     Cervical back: Normal range of motion and neck supple.  Lymphadenopathy:     Cervical: No cervical adenopathy.  Skin:    General: Skin is warm and dry.     Capillary Refill: Capillary refill takes less than 2 seconds.  Neurological:      General: No focal deficit present.     Mental Status: She is alert and oriented to person, place, and time.  Psychiatric:        Mood and Affect: Mood normal.        Behavior: Behavior normal.        Thought Content: Thought content normal.        Judgment: Judgment normal.     Last depression screening scores   Row Labels 04/02/2022   11:25 AM 01/31/2021    8:50 AM 07/03/2020    2:35 PM  PHQ 2/9 Scores   Section Header. No data exists in this row.     PHQ - 2 Score   0 0 0  PHQ- 9 Score   2 3 0   Last fall risk screening   Row Labels 04/02/2022   11:26 AM  Fall Risk    Section Header. No data exists in this row.   Falls in the past year?   0  Number falls in past yr:   0  Injury with Fall?   0  Follow up   Falls evaluation completed    Assessment & Plan    Encounter for general adult medical examination with abnormal findings Assessment & Plan: Annual physical today.   Type 2 diabetes mellitus with hyperglycemia, without long-term current use of insulin (HCC) Assessment &  Plan: HgbA1c 7.5, up from 6.8 at last check.  -Start Mounjaro 2.5 mg weekly.  Continue metformin 1000 mg twice daily. -Limit intake of carbohydrates and excess sugar.  Increase water intake. -Recheck hemoglobin A1c in 3 months.  Orders: -     Tirzepatide; Inject 2.5 mg into the skin once a week.  Dispense: 2 mL; Refill: 1  Class 3 severe obesity due to excess calories with serious comorbidity and body mass index (BMI) of 40.0 to 44.9 in adult [facility]) Assessment & Plan: Body mass index 41.9. -Comorbid condition of type 2 diabetes and hyperlipidemia. -Add Mounjaro 2.5 mg weekly. -Discussed lowering calorie intake to 1500 calories per day and incorporating exercise into daily routine to help lose weight.    Hyperlipidemia associated with type 2 diabetes mellitus [facility]) Assessment & Plan: Recent lipid panel -  Lipid Panel     Component Value Date/Time   CHOL 130 07/30/2022 0833   CHOL 153  11/02/2015 0827   TRIG 196 (H) 07/30/2022 0833   TRIG 159 (H) 11/02/2015 0827   HDL 32 (L) 07/30/2022 0833   HDL 28 (L) 11/02/2015 0827   CHOLHDL 4.1 07/30/2022 0833   CHOLHDL 4.2 11/22/2015 1622   VLDL 44 (H) 11/22/2015 1622   LDLCALC 65 07/30/2022 0833   LDLCALC 93 11/02/2015 0827   LABVLDL 33 07/30/2022 0833    -continue Crestor as prescribed.   GAD (generalized anxiety disorder) Assessment & Plan: Stable. -Continue Lexapro daily. -May continue alprazolam 0.25 mg at bedtime as needed for anxiety/insomnia.     Immunization History  Administered Date(s) Administered   Influenza,inj,Quad PF,6+ Mos 02/22/2018, 12/07/2018   Influenza-Unspecified 01/06/2015, 01/16/2016   PFIZER(Purple Top)SARS-COV-2 Vaccination 07/07/2019, 08/04/2019   Pneumococcal Polysaccharide-23 12/01/2016   Tdap 08/09/2014    Health Maintenance  Topic Date Due   Zoster Vaccines- Shingrix (1 of 2) Never done   OPHTHALMOLOGY EXAM  12/06/2017   Lung Cancer Screening  12/29/2017   COVID-19 Vaccine (3 - 2023-24 season) 12/06/2021   FOOT EXAM  10/09/2023 (Originally 10/10/2021)   INFLUENZA VACCINE  11/06/2022   HEMOGLOBIN A1C  01/29/2023   Diabetic kidney evaluation - Urine ACR  04/03/2023   Diabetic kidney evaluation - eGFR measurement  07/30/2023   PAP SMEAR-Modifier  10/11/2023   MAMMOGRAM  01/22/2024   DTaP/Tdap/Td (2 - Td or Tdap) 08/08/2024   Colonoscopy  05/24/2027   Hepatitis C Screening  Completed   HIV Screening  Completed   HPV VACCINES  Aged Out    Discussed health benefits of physical activity, and encouraged her to engage in regular exercise appropriate for her age and condition.    Return in about 3 months (around 12/06/2022) for diabetes with HgbA1c check. started mounjaro .        Carlean Jews, NP  [facility] at [facility] [phone] (phone) [phone] (fax)  [facility]

## 2022-09-28 NOTE — Assessment & Plan Note (Signed)
Recent lipid panel -  Lipid Panel     Component Value Date/Time   CHOL 130 07/30/2022 0833   CHOL 153 11/02/2015 0827   TRIG 196 (H) 07/30/2022 0833   TRIG 159 (H) 11/02/2015 0827   HDL 32 (L) 07/30/2022 0833   HDL 28 (L) 11/02/2015 0827   CHOLHDL 4.1 07/30/2022 0833   CHOLHDL 4.2 11/22/2015 1622   VLDL 44 (H) 11/22/2015 1622   LDLCALC 65 07/30/2022 0833   LDLCALC 93 11/02/2015 0827   LABVLDL 33 07/30/2022 0833    -continue Crestor as prescribed.

## 2022-09-28 NOTE — Assessment & Plan Note (Signed)
Body mass index 41.9. -Comorbid condition of type 2 diabetes and hyperlipidemia. -Add Mounjaro 2.5 mg weekly. -Discussed lowering calorie intake to 1500 calories per day and incorporating exercise into daily routine to help lose weight.

## 2022-09-28 NOTE — Assessment & Plan Note (Signed)
Stable. -Continue Lexapro daily. -May continue alprazolam 0.25 mg at bedtime as needed for anxiety/insomnia.

## 2022-09-28 NOTE — Assessment & Plan Note (Signed)
Annual physical today. 

## 2022-09-28 NOTE — Assessment & Plan Note (Signed)
HgbA1c 7.5, up from 6.8 at last check.  -Start Mounjaro 2.5 mg weekly.  Continue metformin 1000 mg twice daily. -Limit intake of carbohydrates and excess sugar.  Increase water intake. -Recheck hemoglobin A1c in 3 months.

## 2022-11-06 ENCOUNTER — Other Ambulatory Visit: Payer: Self-pay | Admitting: Nurse Practitioner

## 2022-11-06 DIAGNOSIS — E1165 Type 2 diabetes mellitus with hyperglycemia: Secondary | ICD-10-CM

## 2022-11-13 ENCOUNTER — Other Ambulatory Visit: Payer: Self-pay | Admitting: Nurse Practitioner

## 2022-11-13 DIAGNOSIS — E785 Hyperlipidemia, unspecified: Secondary | ICD-10-CM

## 2022-11-13 DIAGNOSIS — E119 Type 2 diabetes mellitus without complications: Secondary | ICD-10-CM

## 2022-11-13 DIAGNOSIS — I10 Essential (primary) hypertension: Secondary | ICD-10-CM

## 2022-12-09 ENCOUNTER — Other Ambulatory Visit: Payer: Self-pay | Admitting: Nurse Practitioner

## 2022-12-09 ENCOUNTER — Other Ambulatory Visit: Payer: Self-pay | Admitting: Family Medicine

## 2022-12-09 DIAGNOSIS — F411 Generalized anxiety disorder: Secondary | ICD-10-CM

## 2022-12-09 MED ORDER — ESCITALOPRAM OXALATE 10 MG PO TABS
10.0000 mg | ORAL_TABLET | Freq: Every day | ORAL | 1 refills | Status: DC
Start: 2022-12-09 — End: 2023-07-08

## 2022-12-11 ENCOUNTER — Encounter: Payer: Self-pay | Admitting: Family Medicine

## 2022-12-11 ENCOUNTER — Ambulatory Visit (INDEPENDENT_AMBULATORY_CARE_PROVIDER_SITE_OTHER): Payer: 59 | Admitting: Family Medicine

## 2022-12-11 VITALS — BP 131/82 | HR 80 | Ht 67.0 in | Wt 256.4 lb

## 2022-12-11 DIAGNOSIS — E1159 Type 2 diabetes mellitus with other circulatory complications: Secondary | ICD-10-CM

## 2022-12-11 DIAGNOSIS — E785 Hyperlipidemia, unspecified: Secondary | ICD-10-CM | POA: Diagnosis not present

## 2022-12-11 DIAGNOSIS — E1169 Type 2 diabetes mellitus with other specified complication: Secondary | ICD-10-CM | POA: Diagnosis not present

## 2022-12-11 DIAGNOSIS — E119 Type 2 diabetes mellitus without complications: Secondary | ICD-10-CM

## 2022-12-11 DIAGNOSIS — I152 Hypertension secondary to endocrine disorders: Secondary | ICD-10-CM | POA: Diagnosis not present

## 2022-12-11 DIAGNOSIS — Z716 Tobacco abuse counseling: Secondary | ICD-10-CM

## 2022-12-11 DIAGNOSIS — F17200 Nicotine dependence, unspecified, uncomplicated: Secondary | ICD-10-CM

## 2022-12-11 LAB — POCT GLYCOSYLATED HEMOGLOBIN (HGB A1C): HbA1c POC (<> result, manual entry): 6.3 % (ref 4.0–5.6)

## 2022-12-11 MED ORDER — MOUNJARO 2.5 MG/0.5ML ~~LOC~~ SOAJ
2.5000 mg | SUBCUTANEOUS | 0 refills | Status: DC
Start: 2022-12-11 — End: 2023-03-12

## 2022-12-11 NOTE — Assessment & Plan Note (Signed)
Patient is not interested in quitting right now.  She would like to tackle one issue at a time and once her weight is better controlled, she would like to discuss quitting.

## 2022-12-11 NOTE — Assessment & Plan Note (Addendum)
A1c 6.3, fantastic control.  We discussed increasing Mounjaro versus staying at current dose but discontinuing metformin.  Patient would like to maintain current dose of Mounjaro.  Continue Mounjaro 2.5 mg weekly, discontinue metformin.  At next appointment, if blood sugar continues to be well-controlled continue Mounjaro at current dosage.  Can increase dose if needed.

## 2022-12-11 NOTE — Assessment & Plan Note (Signed)
BP goal <130/80.  Blood pressure essentially at goal.  This should also continue to decrease as she continues to lose weight.  Continue losartan 25 mg daily.  Will continue to monitor.

## 2022-12-11 NOTE — Progress Notes (Signed)
Established Patient Office Visit  Subjective   Patient ID: Molly Payne, female    DOB: 1961-03-29  Age: 62 y.o. MRN: [id number]  No chief complaint on file.   HPI Molly Payne is a 62 y.o. female presenting today for follow up of hypertension, hyperlipidemia, diabetes. Hypertension:  Pt denies chest pain, SOB, dizziness, edema, syncope, fatigue or heart palpitations. Taking losartan, reports excellent compliance with treatment. Denies side effects. She has a new puppy that has been keeping her very busy and active throughout the day. Hyperlipidemia: tolerating rosuvastatin well with no myalgias or significant side effects.  The 10-year ASCVD risk score (Arnett DK, et al., 2019) is: 19.5% Diabetes: denies hypoglycemic events, wounds or sores that are not healing well, increased thirst or urination. Denies vision problems, eye exam due. Taking Mounjaro and metformin as prescribed without any side effects.   ROS Negative unless otherwise noted in HPI   Objective:     BP 131/82   Pulse 80   Ht 5\' 7"  (1.702 m)   Wt 256 lb 6.4 oz (116.3 kg)   SpO2 95%   BMI 40.16 kg/m   Physical Exam Constitutional:      General: She is not in acute distress.    Appearance: Normal appearance.  HENT:     Head: Normocephalic and atraumatic.  Cardiovascular:     Rate and Rhythm: Normal rate and regular rhythm.     Heart sounds: No murmur heard.    No friction rub. No gallop.  Pulmonary:     Effort: Pulmonary effort is normal. No respiratory distress.     Breath sounds: No wheezing, rhonchi or rales.  Skin:    General: Skin is warm and dry.  Neurological:     Mental Status: She is alert and oriented to person, place, and time.    Results for orders placed or performed in visit on 12/11/22  POCT glycosylated hemoglobin (Hb A1C)  Result Value Ref Range   Hemoglobin A1C     HbA1c POC (<> result, manual entry) 6.3 4.0 - 5.6 %   HbA1c, POC (prediabetic range)     HbA1c, POC (controlled  diabetic range)       Assessment & Plan:  Hypertension associated with diabetes (HCC) Assessment & Plan: BP goal <130/80.  Blood pressure essentially at goal.  This should also continue to decrease as she continues to lose weight.  Continue losartan 25 mg daily.  Will continue to monitor.   Hyperlipidemia associated with type 2 diabetes mellitus (HCC) Assessment & Plan: Last lipid panel: LDL 65, HDL 32, triglycerides 196.  Continue rosuvastatin 40 mg daily.  Will continue to monitor, triglycerides should improve as weight management continues.  Orders: -     POCT glycosylated hemoglobin (Hb A1C)  Type 2 diabetes mellitus without complication, without long-term current use of insulin (HCC) Assessment & Plan: A1c 6.3, fantastic control.  We discussed increasing Mounjaro versus staying at current dose but discontinuing metformin.  Patient would like to maintain current dose of Mounjaro.  Continue Mounjaro 2.5 mg weekly, discontinue metformin.  At next appointment, if blood sugar continues to be well-controlled continue Mounjaro at current dosage.  Can increase dose if needed.  Orders: -     POCT glycosylated hemoglobin (Hb A1C) -     Mounjaro; Inject 2.5 mg into the skin once a week.  Dispense: 6 mL; Refill: 0  Tobacco use disorder Assessment & Plan: Patient is not interested in quitting right now.  She  would like to tackle one issue at a time and once her weight is better controlled, she would like to discuss quitting.   Encounter for tobacco use cessation counseling    Return in about 3 months (around 03/12/2023) for follow-up for HTN, HLD, DM, fasting blood work 1 week before.    Melida Quitter, PA

## 2022-12-11 NOTE — Assessment & Plan Note (Signed)
Last lipid panel: LDL 65, HDL 32, triglycerides 196.  Continue rosuvastatin 40 mg daily.  Will continue to monitor, triglycerides should improve as weight management continues.

## 2022-12-11 NOTE — Patient Instructions (Signed)
Keep up the amazing work! You can stop taking the metformin and continue with all of your other medication.  If you blood pressure is consistently higher than 130/80 at home, please let me know.

## 2023-02-23 ENCOUNTER — Other Ambulatory Visit: Payer: Self-pay | Admitting: Family Medicine

## 2023-02-23 DIAGNOSIS — E119 Type 2 diabetes mellitus without complications: Secondary | ICD-10-CM

## 2023-02-23 DIAGNOSIS — E1159 Type 2 diabetes mellitus with other circulatory complications: Secondary | ICD-10-CM

## 2023-02-23 DIAGNOSIS — E785 Hyperlipidemia, unspecified: Secondary | ICD-10-CM

## 2023-02-23 DIAGNOSIS — Z0000 Encounter for general adult medical examination without abnormal findings: Secondary | ICD-10-CM

## 2023-02-25 ENCOUNTER — Other Ambulatory Visit: Payer: Self-pay | Admitting: Family Medicine

## 2023-02-25 DIAGNOSIS — I10 Essential (primary) hypertension: Secondary | ICD-10-CM

## 2023-02-25 DIAGNOSIS — E119 Type 2 diabetes mellitus without complications: Secondary | ICD-10-CM

## 2023-02-25 DIAGNOSIS — E785 Hyperlipidemia, unspecified: Secondary | ICD-10-CM

## 2023-03-04 ENCOUNTER — Other Ambulatory Visit: Payer: 59

## 2023-03-04 DIAGNOSIS — E1169 Type 2 diabetes mellitus with other specified complication: Secondary | ICD-10-CM

## 2023-03-04 DIAGNOSIS — Z0000 Encounter for general adult medical examination without abnormal findings: Secondary | ICD-10-CM

## 2023-03-04 DIAGNOSIS — E119 Type 2 diabetes mellitus without complications: Secondary | ICD-10-CM

## 2023-03-04 DIAGNOSIS — E1159 Type 2 diabetes mellitus with other circulatory complications: Secondary | ICD-10-CM

## 2023-03-05 LAB — CBC WITH DIFFERENTIAL/PLATELET
Basophils Absolute: 0 10*3/uL (ref 0.0–0.2)
Basos: 0 %
EOS (ABSOLUTE): 0.1 10*3/uL (ref 0.0–0.4)
Eos: 1 %
Hematocrit: 49.8 % — ABNORMAL HIGH (ref 34.0–46.6)
Hemoglobin: 16.3 g/dL — ABNORMAL HIGH (ref 11.1–15.9)
Immature Grans (Abs): 0 10*3/uL (ref 0.0–0.1)
Immature Granulocytes: 0 %
Lymphocytes Absolute: 1.6 10*3/uL (ref 0.7–3.1)
Lymphs: 23 %
MCH: 29.9 pg (ref 26.6–33.0)
MCHC: 32.7 g/dL (ref 31.5–35.7)
MCV: 91 fL (ref 79–97)
Monocytes Absolute: 0.6 10*3/uL (ref 0.1–0.9)
Monocytes: 8 %
Neutrophils Absolute: 4.8 10*3/uL (ref 1.4–7.0)
Neutrophils: 68 %
Platelets: 103 10*3/uL — ABNORMAL LOW (ref 150–450)
RBC: 5.46 x10E6/uL — ABNORMAL HIGH (ref 3.77–5.28)
RDW: 12.4 % (ref 11.7–15.4)
WBC: 7.2 10*3/uL (ref 3.4–10.8)

## 2023-03-05 LAB — COMPREHENSIVE METABOLIC PANEL
ALT: 18 [IU]/L (ref 0–32)
AST: 33 [IU]/L (ref 0–40)
Albumin: 4 g/dL (ref 3.9–4.9)
Alkaline Phosphatase: 104 [IU]/L (ref 44–121)
BUN/Creatinine Ratio: 11 — ABNORMAL LOW (ref 12–28)
BUN: 8 mg/dL (ref 8–27)
Bilirubin Total: 0.5 mg/dL (ref 0.0–1.2)
CO2: 22 mmol/L (ref 20–29)
Calcium: 9.3 mg/dL (ref 8.7–10.3)
Chloride: 100 mmol/L (ref 96–106)
Creatinine, Ser: 0.71 mg/dL (ref 0.57–1.00)
Globulin, Total: 2.5 g/dL (ref 1.5–4.5)
Glucose: 94 mg/dL (ref 70–99)
Potassium: 4.4 mmol/L (ref 3.5–5.2)
Sodium: 137 mmol/L (ref 134–144)
Total Protein: 6.5 g/dL (ref 6.0–8.5)
eGFR: 97 mL/min/{1.73_m2} (ref >59)

## 2023-03-05 LAB — LIPID PANEL
Chol/HDL Ratio: 3.9 {ratio} (ref 0.0–4.4)
Cholesterol, Total: 122 mg/dL (ref 100–199)
HDL: 31 mg/dL — ABNORMAL LOW (ref >39)
LDL Chol Calc (NIH): 65 mg/dL (ref 0–99)
Triglycerides: 147 mg/dL (ref 0–149)
VLDL Cholesterol Cal: 26 mg/dL (ref 5–40)

## 2023-03-05 LAB — HEMOGLOBIN A1C
Est. average glucose Bld gHb Est-mCnc: 131 mg/dL
Hgb A1c MFr Bld: 6.2 % — ABNORMAL HIGH (ref 4.8–5.6)

## 2023-03-12 ENCOUNTER — Ambulatory Visit (INDEPENDENT_AMBULATORY_CARE_PROVIDER_SITE_OTHER): Payer: 59 | Admitting: Family Medicine

## 2023-03-12 ENCOUNTER — Encounter: Payer: Self-pay | Admitting: Family Medicine

## 2023-03-12 VITALS — BP 118/74 | HR 78 | Resp 18 | Ht 67.0 in | Wt 253.0 lb

## 2023-03-12 DIAGNOSIS — E1169 Type 2 diabetes mellitus with other specified complication: Secondary | ICD-10-CM | POA: Diagnosis not present

## 2023-03-12 DIAGNOSIS — Z716 Tobacco abuse counseling: Secondary | ICD-10-CM

## 2023-03-12 DIAGNOSIS — E119 Type 2 diabetes mellitus without complications: Secondary | ICD-10-CM | POA: Diagnosis not present

## 2023-03-12 DIAGNOSIS — E66813 Obesity, class 3: Secondary | ICD-10-CM

## 2023-03-12 DIAGNOSIS — R300 Dysuria: Secondary | ICD-10-CM | POA: Diagnosis not present

## 2023-03-12 DIAGNOSIS — I152 Hypertension secondary to endocrine disorders: Secondary | ICD-10-CM

## 2023-03-12 DIAGNOSIS — F17200 Nicotine dependence, unspecified, uncomplicated: Secondary | ICD-10-CM

## 2023-03-12 DIAGNOSIS — E1159 Type 2 diabetes mellitus with other circulatory complications: Secondary | ICD-10-CM | POA: Diagnosis not present

## 2023-03-12 DIAGNOSIS — R82998 Other abnormal findings in urine: Secondary | ICD-10-CM

## 2023-03-12 DIAGNOSIS — D696 Thrombocytopenia, unspecified: Secondary | ICD-10-CM

## 2023-03-12 DIAGNOSIS — E785 Hyperlipidemia, unspecified: Secondary | ICD-10-CM

## 2023-03-12 LAB — POCT URINALYSIS DIP (CLINITEK)
Bilirubin, UA: NEGATIVE
Blood, UA: NEGATIVE
Glucose, UA: NEGATIVE mg/dL
Ketones, POC UA: NEGATIVE mg/dL
Nitrite, UA: NEGATIVE
POC PROTEIN,UA: NEGATIVE
Spec Grav, UA: 1.015 (ref 1.010–1.025)
Urobilinogen, UA: 0.2 U/dL
pH, UA: 6 (ref 5.0–8.0)

## 2023-03-12 LAB — POCT UA - MICROALBUMIN
Albumin/Creatinine Ratio, Urine, POC: <30
Creatinine, POC: 100 mg/dL
Microalbumin Ur, POC: 10 mg/L

## 2023-03-12 MED ORDER — MOUNJARO 5 MG/0.5ML ~~LOC~~ SOAJ: 5.0000 mg | SUBCUTANEOUS | 1 refills | Status: DC

## 2023-03-12 MED ORDER — ONDANSETRON HCL 4 MG PO TABS: 4.0000 mg | ORAL_TABLET | Freq: Three times a day (TID) | ORAL | 0 refills | Status: DC | PRN

## 2023-03-12 NOTE — Assessment & Plan Note (Signed)
BP goal <130/80.  Stable, at goal. Continue losartan 25 mg daily.  Will continue to monitor.

## 2023-03-12 NOTE — Assessment & Plan Note (Signed)
Thrombocytopenia initially present in 2018.  On most recent CBC, hemoglobin and hematocrit remain elevated likely due to smoking but stable, platelets further decreased to 103.  No workup to explain thrombocytopenia thus far.  Referral to hematology for further evaluation, no medications on her list closely associated with thrombocytopenia.

## 2023-03-12 NOTE — Assessment & Plan Note (Addendum)
A1c 6.2, continues fantastic control even after discontinuing metformin.  Patient would like to increase Mounjaro to continue weight loss.  Start Mounjaro 5 mg weekly.  Will continue to monitor.

## 2023-03-12 NOTE — Assessment & Plan Note (Signed)
Patient is not interested in quitting right now.  She would like to tackle one issue at a time and once her weight is better controlled, she would like to discuss quitting.  Agreeable to low-dose lung CT screening scan.

## 2023-03-12 NOTE — Progress Notes (Signed)
Established Patient Office Visit  Subjective   Patient ID: Molly Payne, female    DOB: Apr 21, 1960  Age: 62 y.o. MRN: [id number]  Chief Complaint  Patient presents with   Diabetes   Hyperlipidemia   Hypertension   Vaginitis    HPI Molly Payne is a 62 y.o. female presenting today for follow up of hypertension, hyperlipidemia, diabetes.  Additionally, she is concerned about a rash on her back.  It first appeared about 2 weeks ago and has been itchy but never painful.  She does not believe that there were ever any raised bumps or vesicles.  It has been improving over time and is nearly gone but she did not want it to be looked at.  She also describes urinary urgency without frequency or dysuria.  This is new over the past 2 weeks or so as well. Hypertension: Pt denies chest pain, SOB, dizziness, edema, syncope, fatigue or heart palpitations. Taking losartan, reports excellent compliance with treatment. Denies side effects. Hyperlipidemia: tolerating rosuvastatin 40 mg daily well with no myalgias or significant side effects.  The ASCVD Risk score (Arnett DK, et al., 2019) failed to calculate for the following reasons:   The valid total cholesterol range is 130 to 320 mg/dL Diabetes: denies hypoglycemic events, wounds or sores that are not healing well, increased thirst or urination. Denies vision problems, eye exam due. Taking Mounjaro as prescribed without any side effects.  At last appointment, discontinued metformin completely.    Outpatient Medications Prior to Visit  Medication Sig   albuterol (VENTOLIN HFA) 108 (90 Base) MCG/ACT inhaler Inhale 2 puffs into the lungs every 6 (six) hours as needed for wheezing or shortness of breath.   ALPRAZolam (NIRAVAM) 0.25 MG dissolvable tablet Take 1 tablet (0.25 mg total) by mouth at bedtime as needed for anxiety.   aspirin 81 MG tablet Take 81 mg by mouth daily.   escitalopram (LEXAPRO) 10 MG tablet Take 1 tablet (10 mg total) by mouth daily.    losartan (COZAAR) 25 MG tablet TAKE 1 TABLET BY MOUTH ONCE DAILY . APPOINTMENT REQUIRED FOR FUTURE REFILLS   OVER THE COUNTER MEDICATION daily. Air shield take one   rosuvastatin (CRESTOR) 40 MG tablet Take 1 tablet by mouth once daily   [DISCONTINUED] tirzepatide (MOUNJARO) 2.5 MG/0.5ML Pen Inject 2.5 mg into the skin once a week.   No facility-administered medications prior to visit.    ROS Negative unless otherwise noted in HPI   Objective:     BP 118/74 (BP Location: Left Arm, Patient Position: Sitting, Cuff Size: Large)   Pulse 78   Resp 18   Ht 5\' 7"  (1.702 m)   Wt 253 lb (114.8 kg)   SpO2 96%   BMI 39.63 kg/m   Physical Exam Constitutional:      General: She is not in acute distress.    Appearance: Normal appearance.  HENT:     Head: Normocephalic and atraumatic.  Cardiovascular:     Rate and Rhythm: Normal rate and regular rhythm.     Heart sounds: No murmur heard.    No friction rub. No gallop.  Pulmonary:     Effort: Pulmonary effort is normal. No respiratory distress.     Breath sounds: No wheezing, rhonchi or rales.  Skin:    General: Skin is warm and dry.     Findings: Rash (slight erythematous, papular rash over spine at around L3, pinpoint scabs are healing) present.  Neurological:     Mental  Status: She is alert and oriented to person, place, and time.    Diabetic Foot Exam - Simple   Simple Foot Form Diabetic Foot exam was performed with the following findings: Yes 03/12/2023  8:41 AM  Visual Inspection No deformities, no ulcerations, no other skin breakdown bilaterally: Yes Sensation Testing Intact to touch and monofilament testing bilaterally: Yes Pulse Check Posterior Tibialis and Dorsalis pulse intact bilaterally: Yes Comments    Results for orders placed or performed in visit on 03/12/23  POCT UA - Microalbumin  Result Value Ref Range   Microalbumin Ur, POC 10 mg/L   Creatinine, POC 100 mg/dL   Albumin/Creatinine Ratio, Urine, POC  <30   POCT URINALYSIS DIP (CLINITEK)  Result Value Ref Range   Color, UA yellow yellow   Clarity, UA clear clear   Glucose, UA negative negative mg/dL   Bilirubin, UA negative negative   Ketones, POC UA negative negative mg/dL   Spec Grav, UA 1.610 9.604 - 1.025   Blood, UA negative negative   pH, UA 6.0 5.0 - 8.0   POC PROTEIN,UA negative negative, trace   Urobilinogen, UA 0.2 0.2 or 1.0 E.U./dL   Nitrite, UA Negative Negative   Leukocytes, UA Small (1+) (A) Negative    Assessment & Plan:  Type 2 diabetes mellitus without complication, without long-term current use of insulin (HCC) Assessment & Plan: A1c 6.2, continues fantastic control even after discontinuing metformin.  Patient would like to increase Mounjaro to continue weight loss.  Start Mounjaro 5 mg weekly.  Will continue to monitor.  Orders: -     POCT UA - Microalbumin -     Mounjaro; Inject 5 mg into the skin once a week.  Dispense: 6 mL; Refill: 1  Hypertension associated with diabetes (HCC) Assessment & Plan: BP goal <130/80.  Stable, at goal. Continue losartan 25 mg daily.  Will continue to monitor.   Thrombocytopenia (HCC) Assessment & Plan: Thrombocytopenia initially present in 2018.  On most recent CBC, hemoglobin and hematocrit remain elevated likely due to smoking but stable, platelets further decreased to 103.  No workup to explain thrombocytopenia thus far.  Referral to hematology for further evaluation, no medications on her list closely associated with thrombocytopenia.  Orders: -     Ambulatory referral to Hematology / Oncology  Hyperlipidemia associated with type 2 diabetes mellitus (HCC) Assessment & Plan: Last lipid panel: LDL 65, HDL 31, triglycerides 147.  Triglycerides have improved from 196.  Continue rosuvastatin 40 mg daily.  Will continue to monitor.   Encounter for tobacco use cessation counseling  Tobacco use disorder Assessment & Plan: Patient is not interested in quitting right now.   She would like to tackle one issue at a time and once her weight is better controlled, she would like to discuss quitting.  Agreeable to low-dose lung CT screening scan.  Orders: -     CT CHEST LUNG CANCER SCREENING LOW DOSE WO CONTRAST; Future  Dysuria -     POCT URINALYSIS DIP (CLINITEK)  Leukocytes in urine -     Urine Culture  Class 3 severe obesity due to excess calories with serious comorbidity and body mass index (BMI) of 40.0 to 44.9 in adult [facility]) Assessment & Plan: Starting weight 268 lbs, BMI 42.03 Current weight 253 lbs, BMI 39.63 Weight loss: 15 lbs total, 5.6% of starting weight Continue weight loss efforts and Mounjaro 5 mg weekly   Other orders -     Ondansetron HCl; Take 1 tablet (4  mg total) by mouth every 8 (eight) hours as needed for nausea or vomiting.  Dispense: 20 tablet; Refill: 0  Urinalysis negative for nitrites, small leukocytes present so will send for culture but wait to start antibiotics.  Rash is reassuring, not consistent with shingles.  Recommend adequate moisturization, can use over-the-counter hydrocortisone cream if needed for severe itching.  Return in about 3 months (around 06/10/2023) for follow-up for HTN, HLD, DM, POC A1C at visit.    Melida Quitter, PA

## 2023-03-12 NOTE — Patient Instructions (Addendum)
I will send referral to hematology for them to look at your platelets.  For constipation, aim for at least 64 ounces of fluid intake each day and at least 25 g of fiber each day.  You may also take MiraLAX once a day, it is flavor list and you can mix it into any liquid like water or coffee.  Get your diabetic eye exam done in the next couple of months if you can!

## 2023-03-12 NOTE — Assessment & Plan Note (Signed)
Starting weight 268 lbs, BMI 42.03 Current weight 253 lbs, BMI 39.63 Weight loss: 15 lbs total, 5.6% of starting weight Continue weight loss efforts and Mounjaro 5 mg weekly

## 2023-03-12 NOTE — Assessment & Plan Note (Signed)
Last lipid panel: LDL 65, HDL 31, triglycerides 147.  Triglycerides have improved from 196.  Continue rosuvastatin 40 mg daily.  Will continue to monitor.

## 2023-03-13 ENCOUNTER — Encounter: Payer: Self-pay | Admitting: Family Medicine

## 2023-03-16 ENCOUNTER — Other Ambulatory Visit: Payer: Self-pay | Admitting: Family Medicine

## 2023-03-16 DIAGNOSIS — N3000 Acute cystitis without hematuria: Secondary | ICD-10-CM

## 2023-03-16 LAB — URINE CULTURE

## 2023-03-16 MED ORDER — AMOXICILLIN-POT CLAVULANATE 875-125 MG PO TABS: 1.0000 | ORAL_TABLET | Freq: Two times a day (BID) | ORAL | 0 refills | Status: DC

## 2023-04-06 ENCOUNTER — Other Ambulatory Visit: Payer: Self-pay | Admitting: Family Medicine

## 2023-04-06 DIAGNOSIS — E119 Type 2 diabetes mellitus without complications: Secondary | ICD-10-CM

## 2023-04-09 ENCOUNTER — Other Ambulatory Visit: Payer: Self-pay | Admitting: Nurse Practitioner

## 2023-04-09 DIAGNOSIS — E119 Type 2 diabetes mellitus without complications: Secondary | ICD-10-CM

## 2023-04-09 NOTE — Telephone Encounter (Signed)
 Molly Payne, You are listed as this patient's PCP -HB

## 2023-04-09 NOTE — Telephone Encounter (Signed)
 No longer taking

## 2023-04-13 ENCOUNTER — Telehealth: Payer: Self-pay | Admitting: Hematology

## 2023-04-13 ENCOUNTER — Other Ambulatory Visit: Payer: 59

## 2023-04-13 NOTE — Telephone Encounter (Signed)
 Rescheduled appointments per patients request. Patient is aware of the changes made to her upcoming appointments.

## 2023-04-14 ENCOUNTER — Inpatient Hospital Stay: Payer: 59

## 2023-05-05 ENCOUNTER — Inpatient Hospital Stay: Payer: 59

## 2023-05-05 ENCOUNTER — Inpatient Hospital Stay: Payer: 59 | Admitting: Hematology

## 2023-05-14 ENCOUNTER — Encounter: Payer: Self-pay | Admitting: Family Medicine

## 2023-06-02 ENCOUNTER — Encounter: Payer: Self-pay | Admitting: Hematology

## 2023-06-02 ENCOUNTER — Inpatient Hospital Stay: Payer: 59 | Attending: Hematology | Admitting: Hematology

## 2023-06-02 ENCOUNTER — Inpatient Hospital Stay: Payer: 59

## 2023-06-02 VITALS — BP 158/79 | HR 83 | Temp 97.6°F | Resp 18 | Wt 250.3 lb

## 2023-06-02 DIAGNOSIS — Z79899 Other long term (current) drug therapy: Secondary | ICD-10-CM | POA: Insufficient documentation

## 2023-06-02 DIAGNOSIS — D696 Thrombocytopenia, unspecified: Secondary | ICD-10-CM | POA: Insufficient documentation

## 2023-06-02 DIAGNOSIS — K7460 Unspecified cirrhosis of liver: Secondary | ICD-10-CM

## 2023-06-02 DIAGNOSIS — Z7985 Long-term (current) use of injectable non-insulin antidiabetic drugs: Secondary | ICD-10-CM | POA: Insufficient documentation

## 2023-06-02 DIAGNOSIS — F17210 Nicotine dependence, cigarettes, uncomplicated: Secondary | ICD-10-CM | POA: Insufficient documentation

## 2023-06-02 DIAGNOSIS — E7800 Pure hypercholesterolemia, unspecified: Secondary | ICD-10-CM | POA: Insufficient documentation

## 2023-06-02 DIAGNOSIS — K760 Fatty (change of) liver, not elsewhere classified: Secondary | ICD-10-CM | POA: Diagnosis not present

## 2023-06-02 DIAGNOSIS — J449 Chronic obstructive pulmonary disease, unspecified: Secondary | ICD-10-CM | POA: Diagnosis not present

## 2023-06-02 DIAGNOSIS — E119 Type 2 diabetes mellitus without complications: Secondary | ICD-10-CM | POA: Diagnosis not present

## 2023-06-02 DIAGNOSIS — Z7982 Long term (current) use of aspirin: Secondary | ICD-10-CM | POA: Diagnosis not present

## 2023-06-02 DIAGNOSIS — F419 Anxiety disorder, unspecified: Secondary | ICD-10-CM | POA: Diagnosis not present

## 2023-06-02 LAB — CBC WITH DIFFERENTIAL/PLATELET
Abs Immature Granulocytes: 0.02 10*3/uL (ref 0.00–0.07)
Basophils Absolute: 0 10*3/uL (ref 0.0–0.1)
Basophils Relative: 1 %
Eosinophils Absolute: 0.1 10*3/uL (ref 0.0–0.5)
Eosinophils Relative: 1 %
HCT: 47.2 % — ABNORMAL HIGH (ref 36.0–46.0)
Hemoglobin: 15.9 g/dL — ABNORMAL HIGH (ref 12.0–15.0)
Immature Granulocytes: 0 %
Lymphocytes Relative: 23 %
Lymphs Abs: 1.3 10*3/uL (ref 0.7–4.0)
MCH: 30.8 pg (ref 26.0–34.0)
MCHC: 33.7 g/dL (ref 30.0–36.0)
MCV: 91.3 fL (ref 80.0–100.0)
Monocytes Absolute: 0.5 10*3/uL (ref 0.1–1.0)
Monocytes Relative: 9 %
Neutro Abs: 3.8 10*3/uL (ref 1.7–7.7)
Neutrophils Relative %: 66 %
Platelets: 88 10*3/uL — ABNORMAL LOW (ref 150–400)
RBC: 5.17 MIL/uL — ABNORMAL HIGH (ref 3.87–5.11)
RDW: 13.6 % (ref 11.5–15.5)
Smear Review: NORMAL
WBC: 5.8 10*3/uL (ref 4.0–10.5)
nRBC: 0 % (ref 0.0–0.2)

## 2023-06-02 LAB — COMPREHENSIVE METABOLIC PANEL
ALT: 20 U/L (ref 0–44)
AST: 31 U/L (ref 15–41)
Albumin: 4 g/dL (ref 3.5–5.0)
Alkaline Phosphatase: 72 U/L (ref 38–126)
Anion gap: 6 (ref 5–15)
BUN: 10 mg/dL (ref 8–23)
CO2: 27 mmol/L (ref 22–32)
Calcium: 9 mg/dL (ref 8.9–10.3)
Chloride: 104 mmol/L (ref 98–111)
Creatinine, Ser: 0.77 mg/dL (ref 0.44–1.00)
GFR, Estimated: >60 mL/min (ref >60)
Glucose, Bld: 89 mg/dL (ref 70–99)
Potassium: 4 mmol/L (ref 3.5–5.1)
Sodium: 137 mmol/L (ref 135–145)
Total Bilirubin: 0.5 mg/dL (ref 0.0–1.2)
Total Protein: 6.8 g/dL (ref 6.5–8.1)

## 2023-06-02 LAB — HEPATITIS PANEL, ACUTE
HCV Ab: NONREACTIVE
Hep A IgM: NONREACTIVE
Hep B C IgM: NONREACTIVE
Hepatitis B Surface Ag: NONREACTIVE

## 2023-06-02 LAB — FOLATE: Folate: 19.9 ng/mL (ref >5.9)

## 2023-06-02 LAB — IMMATURE PLATELET FRACTION: Immature Platelet Fraction: 5.3 % (ref 1.2–8.6)

## 2023-06-02 LAB — VITAMIN B12: Vitamin B-12: 177 pg/mL — ABNORMAL LOW (ref 180–914)

## 2023-06-03 LAB — AFP TUMOR MARKER: AFP, Serum, Tumor Marker: 5.1 ng/mL (ref 0.0–9.2)

## 2023-06-06 ENCOUNTER — Other Ambulatory Visit: Payer: Self-pay | Admitting: Family Medicine

## 2023-06-06 DIAGNOSIS — I10 Essential (primary) hypertension: Secondary | ICD-10-CM

## 2023-06-06 DIAGNOSIS — E785 Hyperlipidemia, unspecified: Secondary | ICD-10-CM

## 2023-06-06 DIAGNOSIS — E119 Type 2 diabetes mellitus without complications: Secondary | ICD-10-CM

## 2023-06-06 NOTE — Progress Notes (Signed)
 [facility]   [phone] [phone]   Clinic New Consult Note   Patient Care Team: Melida Quitter, PA as PCP - General (Family Medicine) Chrystie Nose, MD as PCP - Cardiology (Cardiology) 06/06/2023  CHIEF COMPLAINTS/PURPOSE OF CONSULTATION:  Thrombocytopenia   REFERRING PHYSICIAN:   Discussed the use of AI scribe software for clinical note transcription with the patient, who gave verbal consent to proceed.  History of Present Illness   The patient, a 63 year old with a history of diabetes, hyperlipidemia, and anxiety, was referred for a hematological consult due to thrombocytopenia. The patient was initially unaware of the low platelet count, which has been ongoing for several years. The patient's primary concern was an elevated white cell count noted during a recent visit to her primary care provider. The patient also mentioned a recent urinary tract infection.  The patient has a history of gallbladder removal, appendectomy, and hysterectomy. She also has a family history of breast cancer, with her mother diagnosed in her sixties. The patient is a current smoker, though she has reduced her consumption. She is also working on weight loss, having lost approximately 30 pounds recently with the help of medication for diabetes.  The patient has not experienced any major bleeding in the past six years, despite the low platelet count. She reported a recent bruise from a dog, but no other significant bruising or bleeding issues. The patient also reported no allergies and denied any alcohol consumption.         MEDICAL HISTORY:  Past Medical History:  Diagnosis Date   Allergy    seasonal   Anxiety    Diabetes mellitus without complication (HCC)    Hyperlipidemia     SURGICAL HISTORY: Past Surgical History:  Procedure Laterality Date   ABDOMINAL HYSTERECTOMY     partial   APPENDECTOMY     CHOLECYSTECTOMY     COLONOSCOPY      SOCIAL  HISTORY: Social History   Socioeconomic History   Marital status: Married    Spouse name: Not on file   Number of children: 2   Years of education: 12   Highest education level: Not on file  Occupational History   Occupation: customer service  Tobacco Use   Smoking status: Every Day    Current packs/day: 1.00    Average packs/day: 1 pack/day for 32.0 years (32.0 ttl pk-yrs)    Types: Cigarettes   Smokeless tobacco: Never  Vaping Use   Vaping status: Never Used  Substance and Sexual Activity   Alcohol use: No    Alcohol/week: 0.0 standard drinks of alcohol   Drug use: No   Sexual activity: Not Currently    Partners: Male  Other Topics Concern   Not on file  Social History Narrative   epworth sleepiness scale = 3 (09/20/15)   Social Drivers of Corporate investment banker Strain: Not on file  Food Insecurity: Not on file  Transportation Needs: Not on file  Physical Activity: Not on file  Stress: Not on file  Social Connections: Not on file  Intimate Partner Violence: Not on file    FAMILY HISTORY: Family History  Problem Relation Age of Onset   Heart disease Mother    Breast cancer Mother 47   Heart disease Sister    Heart disease Sister    Heart attack Sister    Colon cancer Neg Hx    Colon polyps Neg Hx    Esophageal cancer Neg Hx  Rectal cancer Neg Hx    Stomach cancer Neg Hx    Crohn's disease Neg Hx    Ulcerative colitis Neg Hx     ALLERGIES:  has no known allergies.  MEDICATIONS:  Current Outpatient Medications  Medication Sig Dispense Refill   albuterol (VENTOLIN HFA) 108 (90 Base) MCG/ACT inhaler Inhale 2 puffs into the lungs every 6 (six) hours as needed for wheezing or shortness of breath. 54 g 1   ALPRAZolam (NIRAVAM) 0.25 MG dissolvable tablet Take 1 tablet (0.25 mg total) by mouth at bedtime as needed for anxiety. 30 tablet 0   amoxicillin-clavulanate (AUGMENTIN) 875-125 MG tablet Take 1 tablet by mouth 2 (two) times daily. 14 tablet 0    aspirin 81 MG tablet Take 81 mg by mouth daily.     escitalopram (LEXAPRO) 10 MG tablet Take 1 tablet (10 mg total) by mouth daily. 90 tablet 1   losartan (COZAAR) 25 MG tablet TAKE 1 TABLET BY MOUTH ONCE DAILY . APPOINTMENT REQUIRED FOR FUTURE REFILLS 90 tablet 0   ondansetron (ZOFRAN) 4 MG tablet Take 1 tablet (4 mg total) by mouth every 8 (eight) hours as needed for nausea or vomiting. 20 tablet 0   OVER THE COUNTER MEDICATION daily. Air shield take one     rosuvastatin (CRESTOR) 40 MG tablet Take 1 tablet by mouth once daily 90 tablet 0   tirzepatide (MOUNJARO) 5 MG/0.5ML Pen Inject 5 mg into the skin once a week. 6 mL 1   No current facility-administered medications for this visit.    REVIEW OF SYSTEMS:   Constitutional: Denies fevers, chills or abnormal night sweats Eyes: Denies blurriness of vision, double vision or watery eyes Ears, nose, mouth, throat, and face: Denies mucositis or sore throat Respiratory: Denies cough, dyspnea or wheezes Cardiovascular: Denies palpitation, chest discomfort or lower extremity swelling Gastrointestinal:  Denies nausea, heartburn or change in bowel habits Skin: Denies abnormal skin rashes Lymphatics: Denies new lymphadenopathy or easy bruising Neurological:Denies numbness, tingling or new weaknesses Behavioral/Psych: Mood is stable, no new changes  All other systems were reviewed with the patient and are negative.  PHYSICAL EXAMINATION: ECOG PERFORMANCE STATUS: 0 - Asymptomatic  Vitals:   06/02/23 1525  BP: (!) 158/79  Pulse: 83  Resp: 18  Temp: 97.6 F (36.4 C)  SpO2: 97%   Filed Weights   06/02/23 1525  Weight: 250 lb 4.8 oz (113.5 kg)    GENERAL:alert, no distress and comfortable SKIN: skin color, texture, turgor are normal, no rashes or significant lesions EYES: normal, conjunctiva are pink and non-injected, sclera clear OROPHARYNX:no exudate, no erythema and lips, buccal mucosa, and tongue normal  NECK: supple, thyroid normal  size, non-tender, without nodularity LYMPH:  no palpable lymphadenopathy in the cervical, axillary or inguinal LUNGS: clear to auscultation and percussion with normal breathing effort HEART: regular rate & rhythm and no murmurs and no lower extremity edema ABDOMEN:abdomen soft, non-tender and normal bowel sounds Musculoskeletal:no cyanosis of digits and no clubbing  PSYCH: alert & oriented x 3 with fluent speech NEURO: no focal motor/sensory deficits  Physical Exam   CHEST: Wheezing present. ABDOMEN: Spleen non-tender and not palpable.      LABORATORY DATA:  I have reviewed the data as listed    Latest Ref Rng & Units 06/02/2023    4:16 PM 03/04/2023    9:22 AM 07/30/2022    8:33 AM  CBC  WBC 4.0 - 10.5 K/uL 5.8  7.2  7.6   Hemoglobin 12.0 - 15.0  g/dL 96.2  95.2  84.1   Hematocrit 36.0 - 46.0 % 47.2  49.8  49.0   Platelets 150 - 400 K/uL 88  103  117       Latest Ref Rng & Units 06/02/2023    4:16 PM 03/04/2023    9:22 AM 07/30/2022    8:33 AM  CMP  Glucose 70 - 99 mg/dL 89  94  324   BUN 8 - 23 mg/dL 10  8  8    Creatinine 0.44 - 1.00 mg/dL 4.01  0.27  2.53   Sodium 135 - 145 mmol/L 137  137  141   Potassium 3.5 - 5.1 mmol/L 4.0  4.4  4.4   Chloride 98 - 111 mmol/L 104  100  105   CO2 22 - 32 mmol/L 27  22  23    Calcium 8.9 - 10.3 mg/dL 9.0  9.3  9.1   Total Protein 6.5 - 8.1 g/dL 6.8  6.5  6.3   Total Bilirubin 0.0 - 1.2 mg/dL 0.5  0.5  0.6   Alkaline Phos 38 - 126 U/L 72  104  99   AST 15 - 41 U/L 31  33  38   ALT 0 - 44 U/L 20  18  22       RADIOGRAPHIC STUDIES: I have personally reviewed the radiological images as listed and agreed with the findings in the report. No results found.  ASSESSMENT & PLAN:      Liver Cirrhosis Diagnosed with liver cirrhosis, likely secondary to non-alcoholic fatty liver disease (NAFLD). Last CT scan in June 2019 confirmed cirrhosis. No history of alcohol abuse. Signs of portal hypertension and splenomegaly present. Discussed that  cirrhosis is irreversible but manageable to prevent worsening. Explained risks of liver cancer and need for regular monitoring. - Refer to gastroenterology for further evaluation and management - Order abdominal ultrasound every six months - Order AFP tumor marker for liver cancer screening - Order hepatitis B and C tests - Advise no alcohol consumption - Advise caution with acetaminophen use - Encourage weight loss and healthy diet - Order upper endoscopy to check for esophageal varices  Thrombocytopenia Chronic mild thrombocytopenia for the past six years with platelet counts ranging between 100 to 120. No major bleeding episodes reported. 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 Explained that low platelet count can lead to easier bruising and minor bleeding, but current levels are considered safe. - Order complete blood count (CBC) - Monitor platelet count regularly - Educate on signs of bleeding and bruising  Non-Alcoholic Fatty Liver Disease (NAFLD) Fatty liver contributing to liver cirrhosis. On Mounjaro for diabetes and has lost weight, which may help reduce liver fat. Discussed that weight loss and healthy diet can improve liver function and reduce risks associated with NAFLD. - Continue Mounjaro for diabetes management - Encourage further weight loss and healthy diet - Monitor liver function tests regularly  Diabetes Mellitus Diabetic and currently on Mounjaro. Recent weight loss noted. Discussed importance of regular monitoring and follow-up with primary care physician. - Continue Mounjaro - Monitor blood glucose levels regularly - Follow up with primary care physician every three months  Hyperlipidemia High cholesterol and on medication. Discussed importance of regular monitoring and maintaining a healthy diet. - Continue current cholesterol medication - Monitor lipid profile regularly  COPD History of smoking and presents with wheezing. Likely COPD  secondary to smoking. Discussed importance of smoking cessation to improve lung function and overall health. - Advise smoking cessation -  Consider pulmonary function tests if symptoms persist  General Health Maintenance Up to date with mammograms and Pap smears. Family history of breast cancer in mother. Discussed importance of continuing regular screenings. - Continue annual mammograms - Continue regular Pap smears - Encourage smoking cessation  Follow-up - Follow up in six months with abdominal ultrasound one week before - Coordinate care with gastroenterology for liver cirrhosis and endoscopy screening - Follow up with primary care physician every three months.         Orders Placed This Encounter  Procedures   CBC with Differential/Platelet    Standing Status:   Standing    Number of Occurrences:   50    Expiration Date:   06/01/2024   AFP tumor marker    Standing Status:   Standing    Number of Occurrences:   20    Expiration Date:   06/01/2024   Comprehensive metabolic panel    Standing Status:   Standing    Number of Occurrences:   50    Expiration Date:   06/01/2024   Immature Platelet Fraction    Standing Status:   Future    Number of Occurrences:   1    Expiration Date:   06/01/2024   Vitamin B12    Standing Status:   Future    Number of Occurrences:   1    Expected Date:   06/02/2023    Expiration Date:   06/01/2024   Folate, Serum    Standing Status:   Future    Number of Occurrences:   1    Expiration Date:   06/01/2024   Hepatitis panel, acute    Standing Status:   Future    Number of Occurrences:   1    Expiration Date:   06/01/2024    All questions were answered. The patient knows to call the clinic with any problems, questions or concerns. I spent 25 minutes counseling the patient face to face. The total time spent in the appointment was 30 minutes and more than 50% was on counseling.     Malachy Mood, MD 06/02/2023

## 2023-06-10 ENCOUNTER — Ambulatory Visit (INDEPENDENT_AMBULATORY_CARE_PROVIDER_SITE_OTHER): Payer: 59 | Admitting: Family Medicine

## 2023-06-10 ENCOUNTER — Encounter: Payer: Self-pay | Admitting: Family Medicine

## 2023-06-10 VITALS — BP 124/77 | HR 74 | Ht 67.0 in | Wt 246.8 lb

## 2023-06-10 DIAGNOSIS — I152 Hypertension secondary to endocrine disorders: Secondary | ICD-10-CM | POA: Diagnosis not present

## 2023-06-10 DIAGNOSIS — E119 Type 2 diabetes mellitus without complications: Secondary | ICD-10-CM | POA: Diagnosis not present

## 2023-06-10 DIAGNOSIS — E1159 Type 2 diabetes mellitus with other circulatory complications: Secondary | ICD-10-CM | POA: Diagnosis not present

## 2023-06-10 LAB — POCT GLYCOSYLATED HEMOGLOBIN (HGB A1C): Hemoglobin A1C: 5.9 % — AB (ref 4.0–5.6)

## 2023-06-10 MED ORDER — TIRZEPATIDE 7.5 MG/0.5ML ~~LOC~~ SOAJ: 7.5000 mg | SUBCUTANEOUS | 1 refills | Status: DC

## 2023-06-10 NOTE — Patient Instructions (Addendum)
 TO DO: -Schedule an eye exam -Call [facility] to reschedule your CT scan [facility] [address] [place], [place] [address] Phone [phone]

## 2023-06-10 NOTE — Assessment & Plan Note (Addendum)
 A1c 5.9, continues fantastic control even after discontinuing metformin.  Increase Mounjaro dose to 7.5 mg weekly.  Will continue to monitor.

## 2023-06-10 NOTE — Progress Notes (Signed)
 Established Patient Office Visit  Subjective   Patient ID: Molly Payne, female    DOB: October 22, 1960  Age: 63 y.o. MRN: [id number]  Chief Complaint  Patient presents with   Hypertension   Hyperlipidemia   Diabetes    HPI Molly Payne is a 63 y.o. female presenting today for follow up of hypertension, diabetes. Hypertension:  Pt denies chest pain, SOB, dizziness, edema, syncope, fatigue or heart palpitations. Taking losartan, reports excellent compliance with treatment. Denies side effects. Diabetes: denies hypoglycemic events, wounds or sores that are not healing well, increased thirst or urination. Denies vision problems, eye exam due and patient is aware. Taking Mounjaro as prescribed without any side effects.   Outpatient Medications Prior to Visit  Medication Sig   albuterol (VENTOLIN HFA) 108 (90 Base) MCG/ACT inhaler Inhale 2 puffs into the lungs every 6 (six) hours as needed for wheezing or shortness of breath.   ALPRAZolam (NIRAVAM) 0.25 MG dissolvable tablet Take 1 tablet (0.25 mg total) by mouth at bedtime as needed for anxiety.   aspirin 81 MG tablet Take 81 mg by mouth daily.   escitalopram (LEXAPRO) 10 MG tablet Take 1 tablet (10 mg total) by mouth daily.   losartan (COZAAR) 25 MG tablet TAKE 1 TABLET BY MOUTH ONCE DAILY *  APPOINTMENT  REQUIRED  FOR  FUTURE  REFILLS  *   ondansetron (ZOFRAN) 4 MG tablet Take 1 tablet (4 mg total) by mouth every 8 (eight) hours as needed for nausea or vomiting.   OVER THE COUNTER MEDICATION daily. Air shield take one   rosuvastatin (CRESTOR) 40 MG tablet Take 1 tablet by mouth once daily   [DISCONTINUED] amoxicillin-clavulanate (AUGMENTIN) 875-125 MG tablet Take 1 tablet by mouth 2 (two) times daily.   [DISCONTINUED] tirzepatide [facility]) 5 MG/0.5ML Pen Inject 5 mg into the skin once a week.   No facility-administered medications prior to visit.    ROS Negative unless otherwise noted in HPI   Objective:     BP 124/77   Pulse  74   Ht 5\' 7"  (1.702 m)   Wt 246 lb 12 oz (111.9 kg)   SpO2 94%   BMI 38.65 kg/m   Physical Exam Constitutional:      General: She is not in acute distress.    Appearance: Normal appearance.  HENT:     Head: Normocephalic and atraumatic.  Cardiovascular:     Rate and Rhythm: Normal rate and regular rhythm.     Heart sounds: No murmur heard.    No friction rub. No gallop.  Pulmonary:     Effort: Pulmonary effort is normal. No respiratory distress.     Breath sounds: No wheezing, rhonchi or rales.  Skin:    General: Skin is warm and dry.  Neurological:     Mental Status: She is alert and oriented to person, place, and time.    Results for orders placed or performed in visit on 06/10/23  POCT HgB A1C  Result Value Ref Range   Hemoglobin A1C 5.9 (A) 4.0 - 5.6 %   HbA1c POC (<> result, manual entry)     HbA1c, POC (prediabetic range)     HbA1c, POC (controlled diabetic range)       Assessment & Plan:  Hypertension associated with diabetes (HCC) Assessment & Plan: BP goal <130/80.  Stable, at goal. Continue losartan 25 mg daily.  Will continue to monitor.   Type 2 diabetes mellitus without complication, without long-term current use of insulin (  HCC) Assessment & Plan: A1c 5.9, continues fantastic control even after discontinuing metformin.  Increase Mounjaro dose to 7.5 mg weekly.  Will continue to monitor.  Orders: -     POCT glycosylated hemoglobin (Hb A1C) -     Tirzepatide; Inject 7.5 mg into the skin once a week.  Dispense: 6 mL; Refill: 1    Return in about 4 months (around 10/10/2023) for annual physical, fasting labs 1 week before.    Melida Quitter, PA

## 2023-06-10 NOTE — Assessment & Plan Note (Signed)
 BP goal <130/80.  Stable, at goal. Continue losartan 25 mg daily.  Will continue to monitor.

## 2023-06-15 ENCOUNTER — Telehealth: Payer: Self-pay

## 2023-06-15 ENCOUNTER — Telehealth: Payer: Self-pay | Admitting: Nurse Practitioner

## 2023-06-15 ENCOUNTER — Other Ambulatory Visit: Payer: Self-pay

## 2023-06-15 MED ORDER — LUER LOCK SAFETY SYRINGES 25G X 1" 3 ML MISC: 4.0000 | 0 refills | Status: AC

## 2023-06-15 MED ORDER — CYANOCOBALAMIN 1000 MCG/ML IJ SOLN: 1000.0000 ug | INTRAMUSCULAR | 0 refills | Status: AC

## 2023-06-15 NOTE — Telephone Encounter (Signed)
 Patient is aware of scheduled appointment times/dates for both June 2025 and August 2025, patient has callback number if needing to cancel or reschedule upcoming appointments

## 2023-06-15 NOTE — Telephone Encounter (Signed)
 Spoke with patient via telephone call, per Dr. Mosetta Putt. Let pt know her B12 level is low and Dr. Mosetta Putt recommend B12 injection weeklyx4 and oral B12 daily. Let patient know that, per Dr. Mosetta Putt other labs are good. Due to transportation issues patient opted to pick up B12 injection and syringe from the pharmacy to self administer.  Let patient know that she will need to schedule lab in 2-3 month to repeat CBC and B12 level.  Transferred patient to Rockey Situ to further assist with lab appt scheduling for the first week of June, 2025. Molly Payne verbalized understanding when the call was transferred to her.

## 2023-07-04 ENCOUNTER — Other Ambulatory Visit: Payer: Self-pay | Admitting: Family Medicine

## 2023-07-04 DIAGNOSIS — F411 Generalized anxiety disorder: Secondary | ICD-10-CM

## 2023-07-23 ENCOUNTER — Ambulatory Visit: Payer: Self-pay

## 2023-07-23 ENCOUNTER — Encounter (HOSPITAL_COMMUNITY): Payer: Self-pay

## 2023-07-23 ENCOUNTER — Other Ambulatory Visit: Payer: Self-pay

## 2023-07-23 ENCOUNTER — Emergency Department (HOSPITAL_COMMUNITY)
Admission: EM | Admit: 2023-07-23 | Discharge: 2023-07-23 | Disposition: A | Discharge status: Home / Self Care | Attending: Emergency Medicine | Admitting: Emergency Medicine

## 2023-07-23 DIAGNOSIS — H538 Other visual disturbances: Secondary | ICD-10-CM | POA: Diagnosis present

## 2023-07-23 DIAGNOSIS — Z7982 Long term (current) use of aspirin: Secondary | ICD-10-CM | POA: Diagnosis not present

## 2023-07-23 DIAGNOSIS — Z7984 Long term (current) use of oral hypoglycemic drugs: Secondary | ICD-10-CM | POA: Insufficient documentation

## 2023-07-23 DIAGNOSIS — E1165 Type 2 diabetes mellitus with hyperglycemia: Secondary | ICD-10-CM | POA: Insufficient documentation

## 2023-07-23 LAB — CBG MONITORING, ED: Glucose-Capillary: 178 mg/dL — ABNORMAL HIGH (ref 70–99)

## 2023-07-23 NOTE — Telephone Encounter (Signed)
  Chief Complaint: blurry vision Symptoms: blurry vision, HA Frequency: LKWT approx 0830 today Pertinent Negatives: Patient denies CMS changes, speech changes,  Disposition: [x] ED /[] Urgent Care (no appt availability in office) / [] Appointment(In office/virtual)/ []  [place] [facility]/ [] Home Care/ [] Refused Recommended Disposition /[] [facility]/ []  Follow-up with PCP Additional Notes: Pt states she had finished breakfast this morning when she suddenly had blurry vision in both eyes. Pt states that she attempted to rest and close her eyes. Pt states initially it did not improve. Pt states she went and tried to rest and then it resolved. Pt denies CMS changes, denies speech changes. Denies migraine hx. Pt states that she is diabetic but does not have BGM at home. Pt states during the time of the blurred vision she also experienced a HA. Pt advised to seek ED evaluation, pt will have daughter drive her to the ED.  Copied from CRM [id number]. Topic: Clinical - Red Word Triage >> Jul 23, 2023  9:48 AM Crispin Dolphin wrote: Red Word that prompted transfer to Nurse Triage: blurred vision Reason for Disposition  [1] Brief (now gone) blurred vision AND [2] unexplained  Answer Assessment - Initial Assessment Questions 1. DESCRIPTION: "How has your vision changed?" (e.g., complete vision loss, blurred vision, double vision, floaters, etc.)     Blurred vision 2. LOCATION: "One or both eyes?" If one, ask: "Which eye?"     both 3. SEVERITY: "Can you see anything?" If Yes, ask: "What can you see?" (e.g., fine print)     See stuff, but it was badly blurry 4. ONSET: "When did this begin?" "Did it start suddenly or has this been gradual?"     Suddenly 0830 today 5. PATTERN: "Does this come and go, or has it been constant since it started?"     Resolved at this time 6. PAIN: "Is there any pain in your eye(s)?"  (Scale 1-10; or mild, moderate, severe)   - NONE (0): No pain.   - MILD (1-3): Doesn't  interfere with normal activities.   - MODERATE (4-7): Interferes with normal activities or awakens from sleep.    - SEVERE (8-10): Excruciating pain, unable to do any normal activities.     HA-1 when it was there, denies pain in eyes 7. CONTACTS-GLASSES: "Do you wear contacts or glasses?"     glasses 8. CAUSE: "What do you think is causing this visual problem?"     Unsure, maybe migraine 9. OTHER SYMPTOMS: "Do you have any other symptoms?" (e.g., confusion, headache, arm or leg weakness, speech problems)     Denies all s/s, with exception of HA and blurry vision  Protocols used: Vision Loss or Change-A-AH

## 2023-07-23 NOTE — Discharge Instructions (Signed)
Follow-up with your primary care doctor as needed

## 2023-07-23 NOTE — ED Provider Notes (Signed)
 [facility] Provider Note   CSN: [id number] Arrival date & time: 07/23/23  1112     History  Chief Complaint  Patient presents with   Blurred Vision    Molly Payne is a 63 y.o. female.  63 year old female presents with blurred vision which occurred today.  Patient states she was staying at a computer and had been working for quite some time started to scream and all of a sudden she had blurry vision.  Denies any actual vision loss.  No field cuts.  No headaches associated with this.  States it lasted for about 2 minutes and is now back to normal.  Denies any new medications.  No new focal weakness.  Does not use glasses when she reads.  Denies any eye pain itself.       Home Medications Prior to Admission medications   Medication Sig Start Date End Date Taking? Authorizing Provider  albuterol (VENTOLIN HFA) 108 (90 Base) MCG/ACT inhaler Inhale 2 puffs into the lungs every 6 (six) hours as needed for wheezing or shortness of breath. 04/02/22   Carlean Jews, NP  ALPRAZolam (NIRAVAM) 0.25 MG dissolvable tablet Take 1 tablet (0.25 mg total) by mouth at bedtime as needed for anxiety. 05/25/19   Janeece Agee, NP  aspirin 81 MG tablet Take 81 mg by mouth daily.    [provider]  escitalopram (LEXAPRO) 10 MG tablet Take 1 tablet by mouth once daily 07/08/23   Sandre Kitty, MD  losartan (COZAAR) 25 MG tablet TAKE 1 TABLET BY MOUTH ONCE DAILY *  APPOINTMENT  REQUIRED  FOR  FUTURE  REFILLS  * 06/08/23   Saralyn Pilar A, PA  ondansetron (ZOFRAN) 4 MG tablet Take 1 tablet (4 mg total) by mouth every 8 (eight) hours as needed for nausea or vomiting. 03/12/23   Melida Quitter, PA  OVER THE COUNTER MEDICATION daily. Air shield take one    [provider]  rosuvastatin (CRESTOR) 40 MG tablet Take 1 tablet by mouth once daily 06/08/23   Edstrom, Lequita Halt A, PA  SYRINGE-NEEDLE, DISP, 3 ML (LUER LOCK SAFETY SYRINGES) 25G X 1" 3  ML MISC 4 each by Does not apply route once a week. Syringes w/1" needles needed for administration of weekly B12 IM Injections. 06/15/23   Malachy Mood, MD  tirzepatide [facility]) 7.5 MG/0.5ML Pen Inject 7.5 mg into the skin once a week. 06/10/23   Melida Quitter, PA      Allergies    Patient has no known allergies.    Review of Systems   Review of Systems  All other systems reviewed and are negative.   Physical Exam Updated Vital Signs BP (!) 167/88   Pulse 81   Temp 98.3 F (36.8 C) (Oral)   Resp 16   Ht 1.702 m (5\' 7" )   Wt 113.4 kg   SpO2 100%   BMI 39.16 kg/m  Physical Exam Vitals and nursing note reviewed.  Constitutional:      General: She is not in acute distress.    Appearance: Normal appearance. She is well-developed. She is not toxic-appearing.  HENT:     Head: Normocephalic and atraumatic.  Eyes:     General: Lids are normal.     Conjunctiva/sclera: Conjunctivae normal.     Pupils: Pupils are equal, round, and reactive to light.  Neck:     Thyroid: No thyroid mass.     Trachea: No tracheal deviation.  Cardiovascular:     Rate and Rhythm: Normal rate and regular rhythm.     Heart sounds: Normal heart sounds. No murmur heard.    No gallop.  Pulmonary:     Effort: Pulmonary effort is normal. No respiratory distress.     Breath sounds: Normal breath sounds. No stridor. No decreased breath sounds, wheezing, rhonchi or rales.  Abdominal:     General: There is no distension.     Palpations: Abdomen is soft.     Tenderness: There is no abdominal tenderness. There is no rebound.  Musculoskeletal:        General: No tenderness. Normal range of motion.     Cervical back: Normal range of motion and neck supple.  Skin:    General: Skin is warm and dry.     Findings: No abrasion or rash.  Neurological:     General: No focal deficit present.     Mental Status: She is alert and oriented to person, place, and time. Mental status is at baseline.     GCS: GCS eye  subscore is 4. GCS verbal subscore is 5. GCS motor subscore is 6.     Cranial Nerves: No cranial nerve deficit.     Sensory: No sensory deficit.     Motor: Motor function is intact.     Gait: Gait is intact.  Psychiatric:        Attention and Perception: Attention normal.        Speech: Speech normal.        Behavior: Behavior normal.     ED Results / Procedures / Treatments   Labs (all labs ordered are listed, but only abnormal results are displayed) Labs Reviewed  CBG MONITORING, ED - Abnormal; Notable for the following components:      Result Value   Glucose-Capillary 178 (*)    All other components within normal limits    EKG None  Radiology No results found.  Procedures Procedures    Medications Ordered in ED Medications - No data to display  ED Course/ Medical Decision Making/ A&P                                 Medical Decision Making  Patient's blood sugar was 178.  No says she just had a meal before she came here.  She is a type II diabetic and takes weekly shots.  Patient has no focal weakness at this time.  Low suspicion for CVA or TIA as etiology of this.  Her eye exam is benign at this time.  I do not think that she has any ocular pathology.  Patient agreeable to follow-up with her eye doctor.  Return precautions given        Final Clinical Impression(s) / ED Diagnoses Final diagnoses:  None    Rx / DC Orders ED Discharge Orders     None         Lind Repine, MD 07/23/23 1155

## 2023-07-23 NOTE — ED Triage Notes (Addendum)
 Patient stated she was working then both of her eyes became blurry for 2 minutes. Occurred at 9am today. Back to normal now. Denies dizziness or headache. Denies numbness or tingling anywhere.

## 2023-07-24 ENCOUNTER — Telehealth: Payer: Self-pay | Admitting: Family Medicine

## 2023-07-24 NOTE — Telephone Encounter (Signed)
 Copied from CRM [id number]. Topic: Clinical - Medication Refill >> Jul 24, 2023  3:21 PM Opal Bill wrote: Most Recent Primary Care Visit:  Provider: Maryclare Smoke A  Department: [facility]  Visit Type: OFFICE VISIT 20  Date: 06/10/2023  Medication: Glucose Meter, suggested a OneTouch Ultra 2  Has the patient contacted their pharmacy? No (Agent: If no, request that the patient contact the pharmacy for the refill. If patient does not wish to contact the pharmacy document the reason why and proceed with request.) (Agent: If yes, when and what did the pharmacy advise?)  Is this the correct pharmacy for this prescription? Yes If no, delete pharmacy and type the correct one.  This is the patient's preferred pharmacy:  [facility] [address], [place] - [address] [place] [address] Phone: [phone] Fax: [phone]  Has the prescription been filled recently? No  Is the patient out of the medication? N/A  Has the patient been seen for an appointment in the last year OR does the patient have an upcoming appointment? Yes  Can we respond through MyChart? Yes  Agent: Please be advised that Rx refills may take up to 3 business days. We ask that you follow-up with your pharmacy.  Pt says she wants to know if the doctor would be willing to prescribe her a glucose meter so she can check her sugar. She was a patient of Dr. Lawanda Prayer.

## 2023-07-27 LAB — HM DIABETES EYE EXAM

## 2023-07-28 ENCOUNTER — Other Ambulatory Visit: Payer: Self-pay | Admitting: Family Medicine

## 2023-07-28 ENCOUNTER — Telehealth: Payer: Self-pay | Admitting: *Deleted

## 2023-07-28 MED ORDER — ONETOUCH ULTRA 2 W/DEVICE KIT: PACK | 0 refills | Status: AC

## 2023-07-28 MED ORDER — ONETOUCH ULTRASOFT 2 LANCETS MISC: 1.0000 | Freq: Every day | 3 refills | Status: AC

## 2023-07-28 MED ORDER — ONETOUCH ULTRA VI STRP: ORAL_STRIP | 3 refills | Status: AC

## 2023-07-28 NOTE — Telephone Encounter (Signed)
 Glucometer kit strips and lancets sent in

## 2023-07-28 NOTE — Telephone Encounter (Signed)
 Ok

## 2023-07-28 NOTE — Telephone Encounter (Signed)
 Copied from CRM [id number]. Topic: Referral - Request for Referral >> Jul 28, 2023  3:54 PM Alpha Arts wrote: Did the patient discuss referral with their provider in the last year? Yes (If No - schedule appointment) (If Yes - send message)  Appointment offered? No  Type of order/referral and detailed reason for visit: CT Scan or MRI for headaches  Preference of office, provider, location: No preference  If referral order, have you been seen by this specialty before? No (If Yes, this issue or another issue? When? Where?  Can we respond through MyChart? Yes

## 2023-07-29 NOTE — Telephone Encounter (Signed)
 There is no mention of the patient complaining of headaches anywhere in her chart.  If she wants imaging for a headache she needs to make an appointment so we can discuss and document her symptoms.

## 2023-07-29 NOTE — Telephone Encounter (Signed)
Pt informed of below and appt scheduled.

## 2023-08-05 ENCOUNTER — Ambulatory Visit (INDEPENDENT_AMBULATORY_CARE_PROVIDER_SITE_OTHER): Admitting: Family Medicine

## 2023-08-05 ENCOUNTER — Encounter: Payer: Self-pay | Admitting: Family Medicine

## 2023-08-05 VITALS — BP 107/72 | HR 84 | Ht 67.0 in | Wt 245.8 lb

## 2023-08-05 DIAGNOSIS — R519 Headache, unspecified: Secondary | ICD-10-CM | POA: Insufficient documentation

## 2023-08-05 NOTE — Assessment & Plan Note (Signed)
 Initially associated with blurred vision.  Blurry vision has resolved.  Has gone to an optometrist and this was normal.  Went to the emergency department and stroke/TIA was ruled out.  Continues to have mild headaches.  Given her advanced age at onset and the associated blurred vision will get an mri of her brain

## 2023-08-05 NOTE — Progress Notes (Signed)
   Established Patient Office Visit  Subjective   Patient ID: Molly Payne, female    DOB: Jun 06, 1960  Age: 63 y.o. MRN: [id number]  Chief Complaint  Patient presents with   Headache    HPI  Subjective: - New onset headaches following episode of blurred vision 3 weeks ago - Episode of blurred vision while working at computer, lasted <5 minutes, described as "eyes crossing".  No double vision. - Went to ER at [facility], ruled out mini-stroke - No prior history of headaches - Headaches initially daily after vision episode, now less frequent - Describes headaches as "very mild," knows they're there but "not really painful" - No associated symptoms: denies nausea, photophobia, phonophobia - Not taking anything for headaches, they resolve on their own - Denies numbness, tingling, weakness, facial drooping, slurred speech, or comprehension issues during episode - Saw optometrist who performed dilated fundus exam - normal - No recurrence of vision issues since initial episode  - PMHx: thrombocytopenia (followed by hematology), B12 deficiency, diabetes, hyperlipidemia, anxiety - Medications: aspirin daily, Lexapro  10mg , Losartan  25mg , Zofran  prn, Crestor , albuterol  inhaler, Mounjaro  (for diabetes), Xanax  prn, B12 supplement daily (previously on B12 injections for 5 weeks) - Social history: smoker, snores (previous sleep study "long time ago")   The ASCVD Risk score (Arnett DK, et al., 2019) failed to calculate for the following reasons:   The valid total cholesterol range is 130 to 320 mg/dL  Health Maintenance Due  Topic Date Due   Zoster Vaccines- Shingrix (1 of 2) Never done   Pneumococcal Vaccine 15-77 Years old (2 of 2 - PCV) 12/01/2017   Lung Cancer Screening  12/29/2017   COVID-19 Vaccine (3 - 2024-25 season) 12/07/2022      Objective:     BP 107/72   Pulse 84   Ht 5\' 7"  (1.702 m)   Wt 245 lb 12 oz (111.5 kg)   SpO2 96%   BMI 38.49 kg/m    Physical  Exam General: Alert, HEENT: PERRLA, EOMI, moist mucosa Neuro: Cranial nerves II through XII grossly intact, sensation intact bilaterally.  Strength intact bilaterally.   Results for orders placed or performed in visit on 08/05/23  HM DIABETES EYE EXAM  Result Value Ref Range   HM Diabetic Eye Exam No Retinopathy No Retinopathy        Assessment & Plan:   New onset headache Assessment & Plan: Initially associated with blurred vision.  Blurry vision has resolved.  Has gone to an optometrist and this was normal.  Went to the emergency department and stroke/TIA was ruled out.  Continues to have mild headaches.  Given her advanced age at onset and the associated blurred vision will get an mri of her brain  Orders: -     MR BRAIN WO CONTRAST; Future     Return in about 2 months (around 10/05/2023) for DM, headaches.    Laneta Pintos, MD

## 2023-08-05 NOTE — Patient Instructions (Addendum)
 It was nice to see you today,  We addressed the following topics today: -I am going to order an MRI of your brain.  It may take some time to get this scheduled but someone should call you - I will follow back up with you in 2 months.  We can discuss the results and your diabetes and other chronic issues at that time.  Have a great day,  Etha Henle, MD

## 2023-08-06 ENCOUNTER — Ambulatory Visit (HOSPITAL_COMMUNITY)
Admission: RE | Admit: 2023-08-06 | Discharge: 2023-08-06 | Disposition: A | Discharge status: Home / Self Care | Source: Ambulatory Visit | Attending: Family Medicine | Admitting: Family Medicine

## 2023-08-06 DIAGNOSIS — R519 Headache, unspecified: Secondary | ICD-10-CM | POA: Insufficient documentation

## 2023-08-07 ENCOUNTER — Other Ambulatory Visit: Payer: Self-pay | Admitting: Family Medicine

## 2023-08-07 DIAGNOSIS — Z0000 Encounter for general adult medical examination without abnormal findings: Secondary | ICD-10-CM

## 2023-08-12 ENCOUNTER — Encounter: Payer: Self-pay | Admitting: Family Medicine

## 2023-08-17 ENCOUNTER — Ambulatory Visit
Admission: RE | Admit: 2023-08-17 | Discharge: 2023-08-17 | Disposition: A | Discharge status: Home / Self Care | Source: Ambulatory Visit | Attending: Family Medicine

## 2023-08-17 DIAGNOSIS — Z0000 Encounter for general adult medical examination without abnormal findings: Secondary | ICD-10-CM

## 2023-09-01 ENCOUNTER — Other Ambulatory Visit: Payer: Self-pay | Admitting: Family Medicine

## 2023-09-01 DIAGNOSIS — E119 Type 2 diabetes mellitus without complications: Secondary | ICD-10-CM

## 2023-09-01 DIAGNOSIS — I10 Essential (primary) hypertension: Secondary | ICD-10-CM

## 2023-09-01 DIAGNOSIS — E785 Hyperlipidemia, unspecified: Secondary | ICD-10-CM

## 2023-09-07 ENCOUNTER — Inpatient Hospital Stay: Attending: Hematology

## 2023-09-07 DIAGNOSIS — D696 Thrombocytopenia, unspecified: Secondary | ICD-10-CM | POA: Insufficient documentation

## 2023-09-07 LAB — CBC WITH DIFFERENTIAL/PLATELET
Abs Immature Granulocytes: 0.03 10*3/uL (ref 0.00–0.07)
Basophils Absolute: 0 10*3/uL (ref 0.0–0.1)
Basophils Relative: 1 %
Eosinophils Absolute: 0.1 10*3/uL (ref 0.0–0.5)
Eosinophils Relative: 1 %
HCT: 47.7 % — ABNORMAL HIGH (ref 36.0–46.0)
Hemoglobin: 16.2 g/dL — ABNORMAL HIGH (ref 12.0–15.0)
Immature Granulocytes: 0 %
Lymphocytes Relative: 28 %
Lymphs Abs: 1.9 10*3/uL (ref 0.7–4.0)
MCH: 30.5 pg (ref 26.0–34.0)
MCHC: 34 g/dL (ref 30.0–36.0)
MCV: 89.8 fL (ref 80.0–100.0)
Monocytes Absolute: 0.6 10*3/uL (ref 0.1–1.0)
Monocytes Relative: 8 %
Neutro Abs: 4.4 10*3/uL (ref 1.7–7.7)
Neutrophils Relative %: 62 %
Platelets: 110 10*3/uL — ABNORMAL LOW (ref 150–400)
RBC: 5.31 MIL/uL — ABNORMAL HIGH (ref 3.87–5.11)
RDW: 13.2 % (ref 11.5–15.5)
WBC: 7 10*3/uL (ref 4.0–10.5)
nRBC: 0 % (ref 0.0–0.2)

## 2023-09-11 ENCOUNTER — Encounter: Payer: Self-pay | Admitting: Hematology

## 2023-09-24 ENCOUNTER — Other Ambulatory Visit: Payer: Self-pay | Admitting: *Deleted

## 2023-09-24 DIAGNOSIS — E538 Deficiency of other specified B group vitamins: Secondary | ICD-10-CM

## 2023-09-24 DIAGNOSIS — E119 Type 2 diabetes mellitus without complications: Secondary | ICD-10-CM

## 2023-09-24 DIAGNOSIS — E1159 Type 2 diabetes mellitus with other circulatory complications: Secondary | ICD-10-CM

## 2023-09-24 DIAGNOSIS — E1169 Type 2 diabetes mellitus with other specified complication: Secondary | ICD-10-CM

## 2023-09-30 ENCOUNTER — Other Ambulatory Visit

## 2023-09-30 DIAGNOSIS — E119 Type 2 diabetes mellitus without complications: Secondary | ICD-10-CM

## 2023-09-30 DIAGNOSIS — E1159 Type 2 diabetes mellitus with other circulatory complications: Secondary | ICD-10-CM

## 2023-09-30 DIAGNOSIS — E1169 Type 2 diabetes mellitus with other specified complication: Secondary | ICD-10-CM

## 2023-10-01 ENCOUNTER — Ambulatory Visit: Payer: Self-pay

## 2023-10-01 LAB — CBC WITH DIFFERENTIAL/PLATELET
Basophils Absolute: 0 10*3/uL (ref 0.0–0.2)
Basos: 1 %
EOS (ABSOLUTE): 0.1 10*3/uL (ref 0.0–0.4)
Eos: 2 %
Hematocrit: 52.3 % — ABNORMAL HIGH (ref 34.0–46.6)
Hemoglobin: 16.7 g/dL — ABNORMAL HIGH (ref 11.1–15.9)
Immature Grans (Abs): 0 10*3/uL (ref 0.0–0.1)
Immature Granulocytes: 0 %
Lymphocytes Absolute: 1.8 10*3/uL (ref 0.7–3.1)
Lymphs: 24 %
MCH: 30.5 pg (ref 26.6–33.0)
MCHC: 31.9 g/dL (ref 31.5–35.7)
MCV: 96 fL (ref 79–97)
Monocytes Absolute: 0.6 10*3/uL (ref 0.1–0.9)
Monocytes: 8 %
Neutrophils Absolute: 4.9 10*3/uL (ref 1.4–7.0)
Neutrophils: 64 %
Platelets: 105 10*3/uL — ABNORMAL LOW (ref 150–450)
RBC: 5.47 x10E6/uL — ABNORMAL HIGH (ref 3.77–5.28)
RDW: 12.8 % (ref 11.7–15.4)
WBC: 7.5 10*3/uL (ref 3.4–10.8)

## 2023-10-01 LAB — LIPID PANEL
Chol/HDL Ratio: 3.9 ratio (ref 0.0–4.4)
Cholesterol, Total: 129 mg/dL (ref 100–199)
HDL: 33 mg/dL — ABNORMAL LOW (ref >39)
LDL Chol Calc (NIH): 63 mg/dL (ref 0–99)
Triglycerides: 200 mg/dL — ABNORMAL HIGH (ref 0–149)
VLDL Cholesterol Cal: 33 mg/dL (ref 5–40)

## 2023-10-01 LAB — COMPREHENSIVE METABOLIC PANEL WITH GFR
ALT: 22 IU/L (ref 0–32)
AST: 35 IU/L (ref 0–40)
Albumin: 4.3 g/dL (ref 3.9–4.9)
Alkaline Phosphatase: 102 IU/L (ref 44–121)
BUN/Creatinine Ratio: 10 — ABNORMAL LOW (ref 12–28)
BUN: 7 mg/dL — ABNORMAL LOW (ref 8–27)
Bilirubin Total: 0.5 mg/dL (ref 0.0–1.2)
CO2: 18 mmol/L — ABNORMAL LOW (ref 20–29)
Calcium: 9.1 mg/dL (ref 8.7–10.3)
Chloride: 102 mmol/L (ref 96–106)
Creatinine, Ser: 0.73 mg/dL (ref 0.57–1.00)
Globulin, Total: 2.7 g/dL (ref 1.5–4.5)
Glucose: 87 mg/dL (ref 70–99)
Potassium: 4.4 mmol/L (ref 3.5–5.2)
Sodium: 136 mmol/L (ref 134–144)
Total Protein: 7 g/dL (ref 6.0–8.5)
eGFR: 93 mL/min/{1.73_m2} (ref >59)

## 2023-10-01 LAB — MICROALBUMIN / CREATININE URINE RATIO
Creatinine, Urine: 72.1 mg/dL
Microalb/Creat Ratio: 5 mg/g{creat} (ref 0–29)
Microalbumin, Urine: 3.8 ug/mL

## 2023-10-01 LAB — HEMOGLOBIN A1C
Est. average glucose Bld gHb Est-mCnc: 120 mg/dL
Hgb A1c MFr Bld: 5.8 % — ABNORMAL HIGH (ref 4.8–5.6)

## 2023-10-01 LAB — B12 AND FOLATE PANEL
Folate: 7 ng/mL (ref >3.0)
Vitamin B-12: 1056 pg/mL (ref 232–1245)

## 2023-10-02 ENCOUNTER — Other Ambulatory Visit: Payer: Self-pay | Admitting: Family Medicine

## 2023-10-02 DIAGNOSIS — F411 Generalized anxiety disorder: Secondary | ICD-10-CM

## 2023-10-07 ENCOUNTER — Ambulatory Visit (INDEPENDENT_AMBULATORY_CARE_PROVIDER_SITE_OTHER)

## 2023-10-07 ENCOUNTER — Ambulatory Visit: Admitting: Family Medicine

## 2023-10-07 VITALS — BP 125/79 | HR 86 | Ht 67.0 in | Wt 241.8 lb

## 2023-10-07 DIAGNOSIS — R519 Headache, unspecified: Secondary | ICD-10-CM | POA: Diagnosis not present

## 2023-10-07 DIAGNOSIS — K5900 Constipation, unspecified: Secondary | ICD-10-CM | POA: Insufficient documentation

## 2023-10-07 DIAGNOSIS — E119 Type 2 diabetes mellitus without complications: Secondary | ICD-10-CM

## 2023-10-07 DIAGNOSIS — J069 Acute upper respiratory infection, unspecified: Secondary | ICD-10-CM | POA: Diagnosis not present

## 2023-10-07 DIAGNOSIS — Z7985 Long-term (current) use of injectable non-insulin antidiabetic drugs: Secondary | ICD-10-CM

## 2023-10-07 MED ORDER — MOUNJARO 10 MG/0.5ML ~~LOC~~ SOAJ: 10.0000 mg | SUBCUTANEOUS | 3 refills | Status: DC

## 2023-10-07 NOTE — Progress Notes (Signed)
 Established Patient Office Visit  Subjective   Patient ID: Molly Payne, female    DOB: 03-Jan-1961  Age: 63 y.o. MRN: [id number]  Chief Complaint  Patient presents with   Diabetes   Headache    HPI  Molly Payne is a 63 y.o. y/o female who presents to the clinic today for follow up on DM, headaches.   DM: Patient currently takes Mounjaro  7.5 mg weekly for management of their diabetes. Reports that they are taking this medication as prescribed. Denies side effects. Denies episodes of hypoglycemia. Denies new sores/wounds that are not healing.   Headaches: Was evaluated back in April for new onset headaches that started with blurry vision. Blurry vision resolved within a few days and has gone to an optometrist, which revealed normal eye exam. TIA/Stroke was ruled out in the ED. Dr. Chandra ordered Brain MRI that was normal. Today patient reports that her symptoms have completely subsided and she is no longer experiencing headaches or vision changes.   Constipation: Patient reports a several week history of constipation that she is using Colace for. Reports that she is currently having bowel movements every 3-4 days and they are hard in texture and difficult to pass. Denies blood in stool. Colonoscopy UTD. Asking what other options she can try to help with constipation.  Cough: Patient reports that she began experiencing a sore throat on Sunday (10/04/23) which developed into a productive cough. She denies fevers, chest tightness, or shortness of breath. Requesting recommendations for things she can take to help the cough.    ROS Per HPI.    Objective:     BP 125/79   Pulse 86   Ht 5' 7 (1.702 m)   Wt 241 lb 12 oz (109.7 kg)   SpO2 95%   BMI 37.86 kg/m    Physical Exam Constitutional:      General: She is not in acute distress.    Appearance: Normal appearance.  HENT:     Right Ear: Tympanic membrane normal.     Left Ear: Tympanic membrane normal.     Nose: Nose  normal.     Mouth/Throat:     Mouth: Mucous membranes are moist.     Pharynx: Oropharynx is clear. Posterior oropharyngeal erythema present.     Comments: Pencil erase sized lesion noted to the left anterior tonsillar pillar. Patient aware of the lesion and is being evaluated by oral surgeon. Eyes:     Extraocular Movements: Extraocular movements intact.     Pupils: Pupils are equal, round, and reactive to light.  Cardiovascular:     Rate and Rhythm: Normal rate and regular rhythm.     Heart sounds: Normal heart sounds. No murmur heard.    No friction rub. No gallop.  Pulmonary:     Effort: Pulmonary effort is normal. No respiratory distress.     Breath sounds: Normal breath sounds.  Abdominal:     General: Abdomen is flat. Bowel sounds are normal.     Palpations: Abdomen is soft.  Musculoskeletal:        General: No swelling.     Cervical back: Neck supple.  Lymphadenopathy:     Cervical: Cervical adenopathy present.  Skin:    General: Skin is warm and dry.  Neurological:     General: No focal deficit present.     Mental Status: She is alert.  Psychiatric:        Mood and Affect: Mood normal.  Behavior: Behavior normal.        Thought Content: Thought content normal.    No results found for any visits on 10/07/23.  Last CBC Lab Results  Component Value Date   WBC 7.5 09/30/2023   HGB 16.7 (H) 09/30/2023   HCT 52.3 (H) 09/30/2023   MCV 96 09/30/2023   MCH 30.5 09/30/2023   RDW 12.8 09/30/2023   PLT 105 (L) 09/30/2023   Last metabolic panel Lab Results  Component Value Date   GLUCOSE 87 09/30/2023   NA 136 09/30/2023   K 4.4 09/30/2023   CL 102 09/30/2023   CO2 18 (L) 09/30/2023   BUN 7 (L) 09/30/2023   CREATININE 0.73 09/30/2023   EGFR 93 09/30/2023   CALCIUM  9.1 09/30/2023   PROT 7.0 09/30/2023   ALBUMIN 4.3 09/30/2023   LABGLOB 2.7 09/30/2023   AGRATIO 1.9 07/30/2022   BILITOT 0.5 09/30/2023   ALKPHOS 102 09/30/2023   AST 35 09/30/2023   ALT  22 09/30/2023   ANIONGAP 6 06/02/2023   Last lipids Lab Results  Component Value Date   CHOL 129 09/30/2023   HDL 33 (L) 09/30/2023   LDLCALC 63 09/30/2023   TRIG 200 (H) 09/30/2023   CHOLHDL 3.9 09/30/2023   Last hemoglobin A1c Lab Results  Component Value Date   HGBA1C 5.8 (H) 09/30/2023   Last thyroid functions Lab Results  Component Value Date   TSH 1.460 07/30/2022      The ASCVD Risk score (Arnett DK, et al., 2019) failed to calculate for the following reasons:   The valid total cholesterol range is 130 to 320 mg/dL    Assessment & Plan:   Type 2 diabetes mellitus without complication, without long-term current use of insulin (HCC) Assessment & Plan: Most recent A1c at 5.8. Stable. Have increased Mounjaro  to 10 mg weekly per patient request. Eye exam, foot exam, UACR up to date. Will cont to monitor.   New onset headache Assessment & Plan: Symptoms have improved and are no longer bothersome. MRI brain was negative. Advised patient to continue monitoring and notify if her symptoms return.   Viral URI Assessment & Plan: Symptoms have been going on x3 days. Advised patient to continue with rest, supportive care (warm salt water gargles, saline sinus rinses) and over-the-counter Musinex to help thin secretions. Encouraged patient to increase fluid intake and notify if symptoms worsen or fail to improve by Sunday, then can consider antibiotic therapy.   Constipation, unspecified constipation type Assessment & Plan: Recommend Mirilax to use as needed/once daily along with a daily fiber supplement + increased water intake to support having a bowel movement every 1-2 days. Will cont to monitor.   Other orders -     Mounjaro ; Inject 10 mg into the skin once a week.  Dispense: 2 mL; Refill: 3   Return in about 6 months (around 04/08/2024) for Physical.    Saddie JULIANNA Sacks, PA-C

## 2023-10-07 NOTE — Assessment & Plan Note (Signed)
 Symptoms have improved and are no longer bothersome. MRI brain was negative. Advised patient to continue monitoring and notify if her symptoms return.

## 2023-10-07 NOTE — Assessment & Plan Note (Signed)
 Symptoms have been going on x3 days. Advised patient to continue with rest, supportive care (warm salt water gargles, saline sinus rinses) and over-the-counter Musinex to help thin secretions. Encouraged patient to increase fluid intake and notify if symptoms worsen or fail to improve by Sunday, then can consider antibiotic therapy.

## 2023-10-07 NOTE — Assessment & Plan Note (Signed)
 Recommend Mirilax to use as needed/once daily along with a daily fiber supplement + increased water intake to support having a bowel movement every 1-2 days. Will cont to monitor.

## 2023-10-07 NOTE — Patient Instructions (Addendum)
 It was nice to see you today!  As we discussed in clinic:  -I have sent in the increased dose of your Mounjaro  to take weekly. Keep up the great work!  -I am glad that the headaches have subsided. If symptoms return, please let me know!  -For constipation, I recommend taking Mirilax powder (1 cap full) mixed into 8 oz of water. You can use as needed, but it is also safe to take daily. I also recommend adding a fiber supplement (gummies are fine). Just ensure that you are drinking plenty of water with this medication to prevent further constipation. -I also recommend trying Guaifenesin  (Musinex) over the counter to help with the cough. If symptoms haven't improved or they have worsened by Sunday, please send me a message and I will send you in an antibiotic.   I will plan on seeing you back in 6 months! It was nice to meet you!   If you have any problems before your next visit feel free to message me via MyChart (minor issues or questions) or call the office, otherwise you may reach out to schedule an office visit.  Thank you! Saddie Sacks, PA-C

## 2023-10-07 NOTE — Assessment & Plan Note (Signed)
 Most recent A1c at 5.8. Stable. Have increased Mounjaro  to 10 mg weekly per patient request. Eye exam, foot exam, UACR up to date. Will cont to monitor.

## 2023-10-29 NOTE — Telephone Encounter (Signed)
 I have started a new PA for the request for mounjaro  10 with [facility] it was faxed to [phone].  Will wait for a response.

## 2023-10-30 NOTE — Telephone Encounter (Signed)
This PA request has been approved. 

## 2023-11-01 ENCOUNTER — Encounter: Payer: Self-pay | Admitting: Emergency Medicine

## 2023-11-01 ENCOUNTER — Ambulatory Visit
Admission: EM | Admit: 2023-11-01 | Discharge: 2023-11-01 | Disposition: A | Discharge status: Home / Self Care | Attending: Emergency Medicine | Admitting: Emergency Medicine

## 2023-11-01 DIAGNOSIS — W57XXXA Bitten or stung by nonvenomous insect and other nonvenomous arthropods, initial encounter: Secondary | ICD-10-CM | POA: Diagnosis not present

## 2023-11-01 DIAGNOSIS — S30860A Insect bite (nonvenomous) of lower back and pelvis, initial encounter: Secondary | ICD-10-CM

## 2023-11-01 MED ORDER — DOXYCYCLINE HYCLATE 100 MG PO TABS
200.0000 mg | ORAL_TABLET | Freq: Once | ORAL | Status: AC
  Administered 2023-11-01: 200 mg via ORAL

## 2023-11-01 NOTE — Discharge Instructions (Signed)
 The tick head has been removed! The area may bleed a slight amount today Keep clean, wash normally with soap and water Keep an eye out for any increased pain, redness, swelling  You have been treated prophylactically with a dose of doxycycline  to prevent Lyme disease

## 2023-11-01 NOTE — ED Triage Notes (Signed)
 Pt presents with tick head on her back that needs to be removed x 5 days.

## 2023-11-01 NOTE — ED Provider Notes (Signed)
 [facility]    CSN: [id number] Arrival date & time: 11/01/23  1334     History   Chief Complaint Chief Complaint  Patient presents with   Tick Removal    HPI Molly Payne is a 62 y.o. female.  Had a tick bite her on the back 5 days ago She removed the body but the head is still embedded Right lower back  Area discomfort but not painful or swollen  No fever or chills  Last tetanus within 10 years  Past Medical History:  Diagnosis Date   Allergy    seasonal   Anxiety    Diabetes mellitus without complication (HCC)    Hyperlipidemia     Patient Active Problem List   Diagnosis Date Noted   Viral URI 10/07/2023   Constipation 10/07/2023   New onset headache 08/05/2023   Thrombocytopenia (HCC) 03/12/2023   Dental caries 07/09/2020   Hypertension associated with diabetes (HCC) 07/09/2020   GAD (generalized anxiety disorder) 07/09/2020   Agatston coronary artery calcium  score less than 100 11/05/2015   Hyperlipidemia associated with type 2 diabetes mellitus (HCC) 09/20/2015   Family history of heart disease 09/20/2015   Tobacco use disorder 09/20/2015   Class 3 severe obesity due to excess calories with serious comorbidity and body mass index (BMI) of 40.0 to 44.9 in adult 08/09/2014   Type 2 diabetes mellitus without complication, without long-term current use of insulin (HCC) 08/09/2014    Past Surgical History:  Procedure Laterality Date   ABDOMINAL HYSTERECTOMY     partial   APPENDECTOMY     CHOLECYSTECTOMY     COLONOSCOPY      OB History   No obstetric history on file.      Home Medications    Prior to Admission medications   Medication Sig Start Date End Date Taking? Authorizing Provider  albuterol  (VENTOLIN  HFA) 108 (90 Base) MCG/ACT inhaler Inhale 2 puffs into the lungs every 6 (six) hours as needed for wheezing or shortness of breath. 04/02/22   Boscia, Heather E, NP  ALPRAZolam  (NIRAVAM ) 0.25 MG dissolvable tablet Take 1 tablet  (0.25 mg total) by mouth at bedtime as needed for anxiety. 05/25/19   Kip Ade, NP  aspirin 81 MG tablet Take 81 mg by mouth daily.    [provider]  Blood Glucose Monitoring Suppl (ONE TOUCH ULTRA 2) w/Device KIT Check fasting glucose level in am daily 07/28/23   Chandra Toribio POUR, MD  escitalopram  (LEXAPRO ) 10 MG tablet Take 1 tablet by mouth once daily 10/02/23   Clapp, Kara F, PA-C  glucose blood [facility] ULTRA) test strip Use as instructed 07/28/23   Chandra Toribio POUR, MD  losartan  (COZAAR ) 25 MG tablet TAKE 1 TABLET BY MOUTH ONCE DAILY . APPOINTMENT REQUIRED FOR FUTURE REFILLS 09/02/23   Gayle Saddie FALCON, PA-C  ondansetron  (ZOFRAN ) 4 MG tablet Take 1 tablet (4 mg total) by mouth every 8 (eight) hours as needed for nausea or vomiting. 03/12/23   Wallace Joesph LABOR, PA  OneTouch UltraSoft 2 Lancets MISC 1 each by Does not apply route daily before breakfast. 07/28/23   Chandra Toribio POUR, MD  OVER THE COUNTER MEDICATION daily. Air shield take one    [provider]  rosuvastatin  (CRESTOR ) 40 MG tablet Take 1 tablet by mouth once daily 09/02/23   Clapp, Kara F, PA-C  SYRINGE-NEEDLE, DISP, 3 ML (LUER LOCK SAFETY SYRINGES) 25G X 1 3 ML MISC 4 each by Does not apply route once  a week. Syringes w/1 needles needed for administration of weekly B12 IM Injections. 06/15/23   Lanny Callander, MD  tirzepatide  (MOUNJARO ) 10 MG/0.5ML Pen Inject 10 mg into the skin once a week. 10/07/23   Gayle Saddie FALCON, PA-C    Family History Family History  Problem Relation Age of Onset   Heart disease Mother    Breast cancer Mother 34   Heart disease Sister    Heart disease Sister    Heart attack Sister    Colon cancer Neg Hx    Colon polyps Neg Hx    Esophageal cancer Neg Hx    Rectal cancer Neg Hx    Stomach cancer Neg Hx    Crohn's disease Neg Hx    Ulcerative colitis Neg Hx     Social History Social History   Tobacco Use   Smoking status: Every Day    Current packs/day: 1.00    Average packs/day: 1  pack/day for 32.0 years (32.0 ttl pk-yrs)    Types: Cigarettes    Passive exposure: Current   Smokeless tobacco: Never  Vaping Use   Vaping status: Never Used  Substance Use Topics   Alcohol use: No    Alcohol/week: 0.0 standard drinks of alcohol   Drug use: No     Allergies   Patient has no known allergies.   Review of Systems Review of Systems As per HPI  Physical Exam Triage Vital Signs ED Triage Vitals  Encounter Vitals Group     BP 11/01/23 1409 117/76     Girls Systolic BP Percentile --      Girls Diastolic BP Percentile --      Boys Systolic BP Percentile --      Boys Diastolic BP Percentile --      Pulse Rate 11/01/23 1409 89     Resp 11/01/23 1409 20     Temp 11/01/23 1409 98.5 F (36.9 C)     Temp Source 11/01/23 1409 Oral     SpO2 11/01/23 1409 98 %     Weight 11/01/23 1409 241 lb 13.5 oz (109.7 kg)     Height --      Head Circumference --      Peak Flow --      Pain Score 11/01/23 1408 1     Pain Loc --      Pain Education --      Exclude from Growth Chart --    No data found.  Updated Vital Signs BP 117/76 (BP Location: Left Arm)   Pulse 89   Temp 98.5 F (36.9 C) (Oral)   Resp 20   Wt 241 lb 13.5 oz (109.7 kg)   SpO2 98%   BMI 37.88 kg/m    Physical Exam Vitals and nursing note reviewed.  Constitutional:      General: She is not in acute distress.    Appearance: Normal appearance.  HENT:     Mouth/Throat:     Pharynx: Oropharynx is clear.  Cardiovascular:     Rate and Rhythm: Normal rate and regular rhythm.     Pulses: Normal pulses.     Heart sounds: Normal heart sounds.  Pulmonary:     Effort: Pulmonary effort is normal.     Breath sounds: Normal breath sounds.  Skin:        Comments: Small erythematous area on the right lower back with central black lesion thought to be remaining tick head. Not fluctuant or indurated. No other rash on the  body  Neurological:     Mental Status: She is alert and oriented to person, place,  and time.     UC Treatments / Results  Labs (all labs ordered are listed, but only abnormal results are displayed) Labs Reviewed - No data to display  EKG   Radiology No results found.  Procedures Foreign Body Removal  Date/Time: 11/01/2023 4:13 PM  Performed by: Jeryl Stabs, PA-C Authorized by: Jeryl Stabs, PA-C   Consent:    Consent obtained:  Verbal   Consent given by:  Patient   Risks, benefits, and alternatives were discussed: yes     Risks discussed:  Bleeding, infection, incomplete removal and pain Universal protocol:    Procedure explained and questions answered to patient or proxy's satisfaction: yes     Patient identity confirmed:  Verbally with patient Location:    Location:  Trunk   Trunk location:  R flank   Depth:  Intradermal Anesthesia:    Anesthesia method:  Local infiltration   Local anesthetic:  Lidocaine  1% WITH epi Procedure type:    Procedure complexity:  Simple Procedure details:    Incision length:  0.25 cm   Localization method:  Visualized   Bloodless field: yes     Removal mechanism:  Forceps   Foreign bodies recovered:  1   Description:  Head of tick   Intact foreign body removal: yes   Post-procedure details:    Confirmation:  No additional foreign bodies on visualization   Skin closure:  None   Dressing:  Adhesive bandage   Procedure completion:  Tolerated well, no immediate complications  (including critical care time)  Medications Ordered in UC Medications  doxycycline  (VIBRA -TABS) tablet 200 mg (200 mg Oral Given 11/01/23 1555)    Initial Impression / Assessment and Plan / UC Course  I have reviewed the triage vital signs and the nursing notes.  Pertinent labs & imaging results that were available during my care of the patient were reviewed by me and considered in my medical decision making (see chart for details).  Area was cleansed, anesthetized as per procedure note.  Head of tick removed, intact. Discussed  wound care, keeping area clean, monitoring for signs of infection Given that tick has been attached for greater than 36 hours, a single prophylactic dose of doxycycline  is given in clinic. Patient understands reasons to return to clinic  Final Clinical Impressions(s) / UC Diagnoses   Final diagnoses:  Tick bite with subsequent removal of tick     Discharge Instructions      The tick head has been removed! The area may bleed a slight amount today Keep clean, wash normally with soap and water Keep an eye out for any increased pain, redness, swelling  You have been treated prophylactically with a dose of doxycycline  to prevent Lyme disease     ED Prescriptions   None    PDMP not reviewed this encounter.   Jeryl Stabs, PA-C 11/01/23 1616

## 2023-11-27 ENCOUNTER — Other Ambulatory Visit: Payer: Self-pay

## 2023-11-27 DIAGNOSIS — I10 Essential (primary) hypertension: Secondary | ICD-10-CM

## 2023-11-27 DIAGNOSIS — E119 Type 2 diabetes mellitus without complications: Secondary | ICD-10-CM

## 2023-11-28 ENCOUNTER — Other Ambulatory Visit: Payer: Self-pay

## 2023-11-28 DIAGNOSIS — E119 Type 2 diabetes mellitus without complications: Secondary | ICD-10-CM

## 2023-11-28 DIAGNOSIS — E785 Hyperlipidemia, unspecified: Secondary | ICD-10-CM

## 2023-11-29 ENCOUNTER — Other Ambulatory Visit: Payer: Self-pay | Admitting: Nurse Practitioner

## 2023-11-29 DIAGNOSIS — D696 Thrombocytopenia, unspecified: Secondary | ICD-10-CM

## 2023-11-29 NOTE — Assessment & Plan Note (Addendum)
 Chronic mild thrombocytopenia for the past six years with platelet counts ranging between 100 to 120. No major bleeding episodes reported. 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 Explained that low platelet count can lead to easier bruising and minor bleeding, but current levels are considered safe. - Order complete blood count (CBC) - Monitor platelet count regularly - Educate on signs of bleeding and bruising - Abdominal ultrasound ordered as part of today's visit (11/30/2023).

## 2023-11-29 NOTE — Progress Notes (Signed)
 Patient Care Team: Molly Payne as PCP - General (Physician Assistant) Molly Vinie BROCKS, MD as PCP - Cardiology (Cardiology)  Clinic Day:  11/30/2023  Referring physician: Wallace Payne LABOR, PA  ASSESSMENT & PLAN:   Assessment & Plan: Thrombocytopenia (HCC) Chronic mild thrombocytopenia for the past six years with platelet counts ranging between 100 to 120. No major bleeding episodes reported. 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 Explained that low platelet count can lead to easier bruising and minor bleeding, but current levels are considered safe. - Order complete blood count (CBC) - Monitor platelet count regularly - Educate on signs of bleeding and bruising     Thrombocytopenia Labs indicate stable and slightly improved platelet count at 100.  Will continue to monitor every 6 months.  Previous liver studies have been normal.  She does need to have abdominal ultrasound.  This will be ordered as part of today's visit.  Reviewed signs of bleeding and excess bruising. There is evidence of need to be seen sooner.  She voiced understanding.  Elevated Hgb and HCT Patient with more elevated hemoglobin at 17.1 and hematocrit at 50.4.  Patient is a smoker with consistently elevated Hgb and HCT.  This may be contributing to thrombocytopenia.  Reviewed importance of smoking cessation.  To her knowledge, she does not have sleep apnea or heart arrhythmia. Will continue to monitor every 6 months.  Plan Labs reviewed. - Stable thrombocytopenia. - Increased Hgb and HCT from baseline. Repeat.  Importance of smoking cessation She is currently asymptomatic. Plan to repeat labs and follow-up in 6 months, sooner if needed.  The patient understands the plans discussed today and is in agreement with them.  She knows to contact our office if she develops concerns prior to her next appointment.  I provided 20 minutes of face-to-face time during this encounter and > 50% was spent  counseling as documented under my assessment and plan.    Molly FORBES Lessen, NP  [facility] - A DEPT OF [facility] [address] [place] [address] Dept: [phone] Dept Fax: [phone]   No orders of the defined types were placed in this encounter.     CHIEF COMPLAINT:  CC: Thrombocytopenia  Current Treatment:  surveillance  INTERVAL HISTORY:  Molly Payne is here today for repeat clinical assessment.  She initially saw Dr. Lanny on 06/02/2023.  She is currently taking oral B12.  Previous liver studies have been negative with no abnormalities.  She should have abdominal ultrasound for further evaluation.  She denies chest pain, chest pressure, or shortness of breath. She denies headaches or visual disturbances. She denies abdominal pain, nausea, vomiting, or changes in bowel or bladder habits. She denies fevers or chills. She denies pain. Her appetite is good. Her weight has decreased 32 pounds over last year.  She is currently on Wegovy to help with weight loss.  She has also made big improvements in her diet and is more active.  Plans to continue with weight management program through her primary care provider.  I have reviewed the past medical history, past surgical history, social history and family history with the patient and they are unchanged from previous note.  ALLERGIES:  has no known allergies.  MEDICATIONS:  Current Outpatient Medications  Medication Sig Dispense Refill   albuterol  (VENTOLIN  HFA) 108 (90 Base) MCG/ACT inhaler Inhale 2 puffs into the lungs every 6 (six) hours as needed for wheezing  or shortness of breath. 54 g 1   ALPRAZolam  (NIRAVAM ) 0.25 MG dissolvable tablet Take 1 tablet (0.25 mg total) by mouth at bedtime as needed for anxiety. 30 tablet 0   aspirin 81 MG tablet Take 81 mg by mouth daily.     Blood Glucose Monitoring Suppl (ONE TOUCH ULTRA 2) w/Device KIT Check fasting glucose level in  am daily 1 kit 0   escitalopram  (LEXAPRO ) 10 MG tablet Take 1 tablet by mouth once daily 90 tablet 0   glucose blood (ONETOUCH ULTRA) test strip Use as instructed 100 each 3   losartan  (COZAAR ) 25 MG tablet TAKE 1 TABLET BY MOUTH ONCE DAILY *  APPT  REQUIRED  FOR  FUTURE  REFILLS  * 90 tablet 0   ondansetron  (ZOFRAN ) 4 MG tablet Take 1 tablet (4 mg total) by mouth every 8 (eight) hours as needed for nausea or vomiting. 20 tablet 0   OneTouch UltraSoft 2 Lancets MISC 1 each by Does not apply route daily before breakfast. 100 each 3   OVER THE COUNTER MEDICATION daily. Air shield take one     rosuvastatin  (CRESTOR ) 40 MG tablet Take 1 tablet by mouth once daily 90 tablet 0   SYRINGE-NEEDLE, DISP, 3 ML (LUER LOCK SAFETY SYRINGES) 25G X 1 3 ML MISC 4 each by Does not apply route once a week. Syringes w/1 needles needed for administration of weekly B12 IM Injections. 4 each 0   tirzepatide  (MOUNJARO ) 10 MG/0.5ML Pen Inject 10 mg into the skin once a week. 2 mL 3   No current facility-administered medications for this visit.     REVIEW OF SYSTEMS:   Constitutional: Denies fevers, chills or abnormal weight loss.  She is currently participating in medically managed program for weight loss.  Taking Wegovy.  Has lost 30 pounds over the last year. Eyes: Denies blurriness of vision Ears, nose, mouth, throat, and face: Denies mucositis or sore throat Respiratory: Denies cough, dyspnea or wheezes Cardiovascular: Denies palpitation, chest discomfort or lower extremity swelling Gastrointestinal:  Denies nausea, heartburn or change in bowel habits Skin: Denies abnormal skin rashes Lymphatics: Denies new lymphadenopathy or easy bruising Neurological:Denies numbness, tingling or new weaknesses Behavioral/Psych: Mood is stable, no new changes  All other systems were reviewed with the patient and are negative.   VITALS:   Today's Vitals   11/30/23 1356 11/30/23 1412  BP: 138/88   Pulse: 84   Resp:  17   Temp: 98.1 F (36.7 C)   SpO2: 97%   Weight: 235 lb 12.8 oz (107 kg)   PainSc:  0-No pain   Body mass index is 36.93 kg/m.   Wt Readings from Last 3 Encounters:  11/30/23 235 lb 12.8 oz (107 kg)  11/01/23 241 lb 13.5 oz (109.7 kg)  10/07/23 241 lb 12 oz (109.7 kg)    Body mass index is 36.93 kg/m.  Performance status (ECOG): 0 - Asymptomatic  PHYSICAL EXAM:   GENERAL:alert, no distress and comfortable SKIN: skin color, texture, turgor are normal, no rashes or significant lesions EYES: normal, Conjunctiva are pink and non-injected, sclera clear OROPHARYNX:no exudate, no erythema and lips, buccal mucosa, and tongue normal  NECK: supple, thyroid normal size, non-tender, without nodularity LYMPH:  no palpable lymphadenopathy in the cervical, axillary or inguinal LUNGS: clear to auscultation and percussion with normal breathing effort HEART: regular rate & rhythm and no murmurs and no lower extremity edema ABDOMEN:abdomen soft, non-tender and normal bowel sounds Musculoskeletal:no cyanosis of digits and no  clubbing  NEURO: alert & oriented x 3 with fluent speech, no focal motor/sensory deficits  LABORATORY DATA:  I have reviewed the data as listed    Component Value Date/Time   NA 137 11/30/2023 1336   NA 136 09/30/2023 0913   K 4.0 11/30/2023 1336   CL 102 11/30/2023 1336   CO2 30 11/30/2023 1336   GLUCOSE 82 11/30/2023 1336   BUN 9 11/30/2023 1336   BUN 7 (L) 09/30/2023 0913   CREATININE 0.80 11/30/2023 1336   CREATININE 0.60 11/22/2015 1622   CALCIUM  9.4 11/30/2023 1336   PROT 7.3 11/30/2023 1336   PROT 7.0 09/30/2023 0913   ALBUMIN 4.5 11/30/2023 1336   ALBUMIN 4.3 09/30/2023 0913   AST 28 11/30/2023 1336   ALT 18 11/30/2023 1336   ALKPHOS 78 11/30/2023 1336   BILITOT 0.6 11/30/2023 1336   BILITOT 0.5 09/30/2023 0913   GFRNONAA >60 11/30/2023 1336   GFRNONAA >89 06/20/2015 0846   GFRAA 111 06/15/2019 0940   GFRAA >89 06/20/2015 0846   Lab Results   Component Value Date   WBC 7.9 11/30/2023   NEUTROABS 5.3 11/30/2023   HGB 17.1 (H) 11/30/2023   HCT 50.4 (H) 11/30/2023   MCV 89.5 11/30/2023   PLT 100 (L) 11/30/2023

## 2023-11-30 ENCOUNTER — Inpatient Hospital Stay (HOSPITAL_BASED_OUTPATIENT_CLINIC_OR_DEPARTMENT_OTHER): Payer: 59 | Admitting: Nurse Practitioner

## 2023-11-30 ENCOUNTER — Inpatient Hospital Stay: Payer: 59 | Attending: Hematology

## 2023-11-30 VITALS — BP 138/88 | HR 84 | Temp 98.1°F | Resp 17 | Wt 235.8 lb

## 2023-11-30 DIAGNOSIS — D696 Thrombocytopenia, unspecified: Secondary | ICD-10-CM | POA: Insufficient documentation

## 2023-11-30 DIAGNOSIS — Z79899 Other long term (current) drug therapy: Secondary | ICD-10-CM | POA: Diagnosis not present

## 2023-11-30 DIAGNOSIS — K7460 Unspecified cirrhosis of liver: Secondary | ICD-10-CM

## 2023-11-30 DIAGNOSIS — Z7982 Long term (current) use of aspirin: Secondary | ICD-10-CM | POA: Diagnosis not present

## 2023-11-30 LAB — CBC WITH DIFFERENTIAL (CANCER CENTER ONLY)
Abs Immature Granulocytes: 0.03 K/uL (ref 0.00–0.07)
Basophils Absolute: 0.1 K/uL (ref 0.0–0.1)
Basophils Relative: 1 %
Eosinophils Absolute: 0.1 K/uL (ref 0.0–0.5)
Eosinophils Relative: 1 %
HCT: 50.4 % — ABNORMAL HIGH (ref 36.0–46.0)
Hemoglobin: 17.1 g/dL — ABNORMAL HIGH (ref 12.0–15.0)
Immature Granulocytes: 0 %
Lymphocytes Relative: 23 %
Lymphs Abs: 1.8 K/uL (ref 0.7–4.0)
MCH: 30.4 pg (ref 26.0–34.0)
MCHC: 33.9 g/dL (ref 30.0–36.0)
MCV: 89.5 fL (ref 80.0–100.0)
Monocytes Absolute: 0.6 K/uL (ref 0.1–1.0)
Monocytes Relative: 8 %
Neutro Abs: 5.3 K/uL (ref 1.7–7.7)
Neutrophils Relative %: 67 %
Platelet Count: 100 K/uL — ABNORMAL LOW (ref 150–400)
RBC: 5.63 MIL/uL — ABNORMAL HIGH (ref 3.87–5.11)
RDW: 13.3 % (ref 11.5–15.5)
WBC Count: 7.9 K/uL (ref 4.0–10.5)
nRBC: 0 % (ref 0.0–0.2)

## 2023-11-30 LAB — COMPREHENSIVE METABOLIC PANEL WITH GFR
ALT: 18 U/L (ref 0–44)
AST: 28 U/L (ref 15–41)
Albumin: 4.5 g/dL (ref 3.5–5.0)
Alkaline Phosphatase: 78 U/L (ref 38–126)
Anion gap: 5 (ref 5–15)
BUN: 9 mg/dL (ref 8–23)
CO2: 30 mmol/L (ref 22–32)
Calcium: 9.4 mg/dL (ref 8.9–10.3)
Chloride: 102 mmol/L (ref 98–111)
Creatinine, Ser: 0.8 mg/dL (ref 0.44–1.00)
GFR, Estimated: >60 mL/min (ref >60)
Glucose, Bld: 82 mg/dL (ref 70–99)
Potassium: 4 mmol/L (ref 3.5–5.1)
Sodium: 137 mmol/L (ref 135–145)
Total Bilirubin: 0.6 mg/dL (ref 0.0–1.2)
Total Protein: 7.3 g/dL (ref 6.5–8.1)

## 2023-11-30 LAB — VITAMIN B12: Vitamin B-12: 1011 pg/mL — ABNORMAL HIGH (ref 180–914)

## 2023-12-01 LAB — AFP TUMOR MARKER: AFP, Serum, Tumor Marker: 5.1 ng/mL (ref 0.0–9.2)

## 2023-12-05 ENCOUNTER — Encounter: Payer: Self-pay | Admitting: Nurse Practitioner

## 2023-12-08 ENCOUNTER — Encounter: Payer: Self-pay | Admitting: Nurse Practitioner

## 2023-12-09 ENCOUNTER — Ambulatory Visit: Payer: Self-pay

## 2023-12-09 ENCOUNTER — Emergency Department (HOSPITAL_BASED_OUTPATIENT_CLINIC_OR_DEPARTMENT_OTHER)
Admission: EM | Admit: 2023-12-09 | Discharge: 2023-12-09 | Disposition: A | Discharge status: Home / Self Care | Attending: Emergency Medicine | Admitting: Emergency Medicine

## 2023-12-09 ENCOUNTER — Other Ambulatory Visit: Payer: Self-pay

## 2023-12-09 DIAGNOSIS — J029 Acute pharyngitis, unspecified: Secondary | ICD-10-CM | POA: Diagnosis not present

## 2023-12-09 DIAGNOSIS — J069 Acute upper respiratory infection, unspecified: Secondary | ICD-10-CM

## 2023-12-09 DIAGNOSIS — Z1211 Encounter for screening for malignant neoplasm of colon: Secondary | ICD-10-CM | POA: Insufficient documentation

## 2023-12-09 DIAGNOSIS — E119 Type 2 diabetes mellitus without complications: Secondary | ICD-10-CM | POA: Diagnosis not present

## 2023-12-09 DIAGNOSIS — R933 Abnormal findings on diagnostic imaging of other parts of digestive tract: Secondary | ICD-10-CM | POA: Insufficient documentation

## 2023-12-09 DIAGNOSIS — R0981 Nasal congestion: Secondary | ICD-10-CM | POA: Diagnosis not present

## 2023-12-09 DIAGNOSIS — E6609 Other obesity due to excess calories: Secondary | ICD-10-CM | POA: Insufficient documentation

## 2023-12-09 DIAGNOSIS — R059 Cough, unspecified: Secondary | ICD-10-CM | POA: Diagnosis present

## 2023-12-09 DIAGNOSIS — Z7982 Long term (current) use of aspirin: Secondary | ICD-10-CM | POA: Diagnosis not present

## 2023-12-09 LAB — RESP PANEL BY RT-PCR (RSV, FLU A&B, COVID)  RVPGX2
Influenza A by PCR: NEGATIVE
Influenza B by PCR: NEGATIVE
Resp Syncytial Virus by PCR: NEGATIVE
SARS Coronavirus 2 by RT PCR: NEGATIVE

## 2023-12-09 LAB — GROUP A STREP BY PCR: Group A Strep by PCR: NOT DETECTED

## 2023-12-09 NOTE — ED Triage Notes (Signed)
 Pt caox4 ambulatory c/o sore throat, productive cough since approx 3a, denies fever/chills.

## 2023-12-09 NOTE — ED Provider Notes (Signed)
 [place] EMERGENCY DEPARTMENT AT [facility] Provider Note   CSN: [id number] Arrival date & time: 12/09/23  1035     Patient presents with: Sore Throat   Molly Payne is a 63 y.o. female history of diabetes, hyperlipidemia presents with complaints of sore throat, cough, nasal congestion that started earlier this morning.  Not associate with any fevers, chills.  Denies any chest pain, shortness of breath, abdominal pain, vomiting or diarrhea.  No recent sick contacts.    Sore Throat      Past Medical History:  Diagnosis Date   Allergy    seasonal   Anxiety    Diabetes mellitus without complication (HCC)    Hyperlipidemia    Past Surgical History:  Procedure Laterality Date   ABDOMINAL HYSTERECTOMY     partial   APPENDECTOMY     CHOLECYSTECTOMY     COLONOSCOPY       Prior to Admission medications   Medication Sig Start Date End Date Taking? Authorizing Provider  albuterol  (VENTOLIN  HFA) 108 (90 Base) MCG/ACT inhaler Inhale 2 puffs into the lungs every 6 (six) hours as needed for wheezing or shortness of breath. 04/02/22   Boscia, Heather E, NP  ALPRAZolam  (NIRAVAM ) 0.25 MG dissolvable tablet Take 1 tablet (0.25 mg total) by mouth at bedtime as needed for anxiety. 05/25/19   Kip Ade, NP  aspirin 81 MG tablet Take 81 mg by mouth daily.    [provider]  Blood Glucose Monitoring Suppl (ONE TOUCH ULTRA 2) w/Device KIT Check fasting glucose level in am daily 07/28/23   Chandra Toribio POUR, MD  escitalopram  (LEXAPRO ) 10 MG tablet Take 1 tablet by mouth once daily 10/02/23   Clapp, Kara F, PA-C  glucose blood [facility] ULTRA) test strip Use as instructed 07/28/23   Chandra Toribio POUR, MD  losartan  (COZAAR ) 25 MG tablet TAKE 1 TABLET BY MOUTH ONCE DAILY *  APPT  REQUIRED  FOR  FUTURE  REFILLS  * 11/27/23   Gayle Saddie FALCON, PA-C  ondansetron  (ZOFRAN ) 4 MG tablet Take 1 tablet (4 mg total) by mouth every 8 (eight) hours as needed for nausea or vomiting. 03/12/23    Wallace Joesph LABOR, PA  OneTouch UltraSoft 2 Lancets MISC 1 each by Does not apply route daily before breakfast. 07/28/23   Chandra Toribio POUR, MD  OVER THE COUNTER MEDICATION daily. Air shield take one    [provider]  rosuvastatin  (CRESTOR ) 40 MG tablet Take 1 tablet by mouth once daily 11/30/23   Clapp, Kara F, PA-C  SYRINGE-NEEDLE, DISP, 3 ML (LUER LOCK SAFETY SYRINGES) 25G X 1 3 ML MISC 4 each by Does not apply route once a week. Syringes w/1 needles needed for administration of weekly B12 IM Injections. 06/15/23   Lanny Callander, MD  tirzepatide  (MOUNJARO ) 10 MG/0.5ML Pen Inject 10 mg into the skin once a week. 10/07/23   Gayle Saddie FALCON, PA-C    Allergies: Patient has no known allergies.    Review of Systems  Respiratory:  Positive for cough.     Updated Vital Signs BP 94/65   Pulse 70   Temp 97.7 F (36.5 C) (Oral)   Resp 18   Ht 5' 7 (1.702 m)   Wt 108.9 kg   SpO2 92%   BMI 37.59 kg/m   Physical Exam Vitals and nursing note reviewed.  Constitutional:      General: She is not in acute distress.    Appearance: She is well-developed.  HENT:  Head: Normocephalic and atraumatic.     Mouth/Throat:     Pharynx: No oropharyngeal exudate or posterior oropharyngeal erythema.     Comments: No trismus or pooling of secretions, uvula is midline, no abnormal phonation, no submandibular swelling Eyes:     Conjunctiva/sclera: Conjunctivae normal.  Cardiovascular:     Rate and Rhythm: Normal rate and regular rhythm.     Heart sounds: No murmur heard. Pulmonary:     Effort: Pulmonary effort is normal. No respiratory distress.     Breath sounds: Normal breath sounds.  Abdominal:     Palpations: Abdomen is soft.     Tenderness: There is no abdominal tenderness.  Musculoskeletal:        General: No swelling.     Cervical back: Neck supple.  Skin:    General: Skin is warm and dry.     Capillary Refill: Capillary refill takes less than 2 seconds.  Neurological:     Mental  Status: She is alert.  Psychiatric:        Mood and Affect: Mood normal.     (all labs ordered are listed, but only abnormal results are displayed) Labs Reviewed  GROUP A STREP BY PCR  RESP PANEL BY RT-PCR (RSV, FLU A&B, COVID)  [id number]    EKG: None  Radiology: No results found.   Procedures   Medications Ordered in the ED - No data to display  Clinical Course as of 12/09/23 1216  Wed Dec 09, 2023  1149 Group A Strep by PCR if patient complains of sore throat. Negative [JT]  1201 Resp panel by RT-PCR (RSV, Flu A&B, Covid) Throat Negative [JT]  1213 Patient evaluated for complaints of sore throat, cough and nasal congestion that started earlier this morning.  Is not associated with any other concerning symptoms.  She is hemodynamically stable.  Her lungs are clear.  Abdomen is nontender.  Respiratory panel and strep test are negative.  Do not feel that any further labs or imaging are indicated at this time as her symptoms are most consistent with a early viral URI.  Instructed on supportive care and strict return precautions including worsening cough, fever, persistent vomiting.  Patient is understanding and agreement with plan. [JT]    Clinical Course User Index [JT] Donnajean Lynwood DEL, PA-C                                 Medical Decision Making Amount and/or Complexity of Data Reviewed Labs:  Decision-making details documented in ED Course.   This patient presents to the ED with chief complaint(s) of Cough.  The complaint involves an extensive differential diagnosis and also carries with it a high risk of complications and morbidity.   Pertinent past medical history as listed in HPI  The differential diagnosis includes  Respiratory panel and strep test are negative.  Lungs are clear, she is afebrile.  Do not suspect pneumonia.  She has no sinus tenderness to suggest for acute sinusitis.  Her exam is not consistent with Ludwig's angina or deep space  infection.  Additional history obtained: Records reviewed Care Everywhere/External Records  Disposition:   Patient will be discharged home. The patient has been appropriately medically screened and/or stabilized in the ED. I have low suspicion for any other emergent medical condition which would require further screening, evaluation or treatment in the ED or require inpatient management. At time of discharge the patient is hemodynamically stable  and in no acute distress. I have discussed work-up results and diagnosis with patient and answered all questions. Patient is agreeable with discharge plan. We discussed strict return precautions for returning to the emergency department and they verbalized understanding.     Social Determinants of Health:   none  This note was dictated with voice recognition software.  Despite best efforts at proofreading, errors may have occurred which can change the documentation meaning.       Final diagnoses:  None    ED Discharge Orders     None          Donnajean Lynwood VEAR DEVONNA 12/09/23 1216    Jerrol Lynwood, MD 12/09/23 [phone]

## 2023-12-09 NOTE — Telephone Encounter (Signed)
 Patient going to Urgent Care due to no availability in PCP office  FYI Only or Action Required?: FYI only for provider.  Patient was last seen in primary care on 08/05/2023 by Chandra Toribio POUR, MD.  Called Nurse Triage reporting Sore Throat.  Symptoms began in the middle of the night today.  Interventions attempted: Rest, hydration, or home remedies.  Symptoms are: gradually worsening.  Triage Disposition: Home Care  Patient/caregiver understands and will follow disposition?: Yes--Patient decided to go to Urgent Care to be seen for her symptoms today due to no availability in PCP office                   Copied from CRM #[id number]. Topic: Clinical - Red Word Triage >> Dec 09, 2023  8:20 AM Shaquaya BRAVO wrote: Red Word that prompted transfer to Nurse Triage: patient woke up middle of night with sore throat, sinus pain on left side, gum painful on left side, Reason for Disposition  Cough with cold symptoms (e.g., runny nose, postnasal drip, throat clearing)  Answer Assessment - Initial Assessment Questions 1. ONSET: When did the cough begin?      Middle of the night today 2. SEVERITY: How bad is the cough today?      --------- 3. SPUTUM: Describe the color of your sputum (e.g., none, dry cough; clear, white, yellow, green)     clear 4. HEMOPTYSIS: Are you coughing up any blood? If Yes, ask: How much? (e.g., flecks, streaks, tablespoons, etc.)     no 5. DIFFICULTY BREATHING: Are you having difficulty breathing? If Yes, ask: How bad is it? (e.g., mild, moderate, severe)      no 6. FEVER: Do you have a fever? If Yes, ask: What is your temperature, how was it measured, and when did it start?     No 7. CARDIAC HISTORY: Do you have any history of heart disease? (e.g., heart attack, congestive heart failure)      no 8. LUNG HISTORY: Do you have any history of lung disease?  (e.g., pulmonary embolus, asthma, emphysema)     no 9. PE RISK FACTORS: Do you  have a history of blood clots? (or: recent major surgery, recent prolonged travel, bedridden)     no 10. OTHER SYMPTOMS: Do you have any other symptoms? (e.g., runny nose, wheezing, chest pain)       Sinus congestion, sore throat  Answer Assessment - Initial Assessment Questions 1. ONSET: When did the throat start hurting? (Hours or days ago)      In the middle of the night today 12/09/2023 2. SEVERITY: How bad is the sore throat? (Scale 1-10; mild, moderate or severe)     7 3. STREP EXPOSURE: Has there been any exposure to strep within the past week? If Yes, ask: What type of contact occurred?      unknown 4.  VIRAL SYMPTOMS: Are there any symptoms of a cold, such as a runny nose, cough, hoarse voice or red eyes?      Sinus pain on left side 5. FEVER: Do you have a fever? If Yes, ask: What is your temperature, how was it measured, and when did it start?     No 6. PUS ON THE TONSILS: Is there pus on the tonsils in the back of your throat?     No 7. OTHER SYMPTOMS: Do you have any other symptoms? (e.g., difficulty breathing, headache, rash)     Sinus pain on left side, sinus draining, congestion  Protocols  used: Sore Throat-A-AH, Cough - Acute Productive-A-AH

## 2023-12-09 NOTE — Discharge Instructions (Signed)
 You may use Tylenol  1000 mg and/or Motrin 600 mg every 4-6 hours up to 3 times a day for fever or body aches.  Please keep in mind that this dosing is not meant to be continued long-term and that many over-the-counter cough and flu medications contain acetaminophen  or ibuprofen. You can expect your current symptoms to linger over the next week or two but please return to the emergency room if you experience any new or worsening symptoms including persistent fevers, worsening productive cough and persistent vomiting. Please follow-up with your primary care provider regarding your ER visit.

## 2023-12-11 ENCOUNTER — Other Ambulatory Visit

## 2023-12-15 ENCOUNTER — Ambulatory Visit
Admission: RE | Admit: 2023-12-15 | Discharge: 2023-12-15 | Disposition: A | Discharge status: Home / Self Care | Source: Ambulatory Visit | Attending: Nurse Practitioner

## 2023-12-15 DIAGNOSIS — D696 Thrombocytopenia, unspecified: Secondary | ICD-10-CM

## 2023-12-26 ENCOUNTER — Other Ambulatory Visit: Payer: Self-pay

## 2023-12-26 DIAGNOSIS — F411 Generalized anxiety disorder: Secondary | ICD-10-CM

## 2024-02-24 ENCOUNTER — Other Ambulatory Visit: Payer: Self-pay

## 2024-02-26 ENCOUNTER — Other Ambulatory Visit: Payer: Self-pay

## 2024-02-26 DIAGNOSIS — E785 Hyperlipidemia, unspecified: Secondary | ICD-10-CM

## 2024-02-26 DIAGNOSIS — E119 Type 2 diabetes mellitus without complications: Secondary | ICD-10-CM

## 2024-02-26 DIAGNOSIS — I10 Essential (primary) hypertension: Secondary | ICD-10-CM

## 2024-03-25 ENCOUNTER — Other Ambulatory Visit: Payer: Self-pay

## 2024-03-25 DIAGNOSIS — F411 Generalized anxiety disorder: Secondary | ICD-10-CM

## 2024-03-28 ENCOUNTER — Ambulatory Visit: Payer: Self-pay

## 2024-03-28 NOTE — Telephone Encounter (Signed)
 FYI Only or Action Required?: FYI only for provider: UC advised.  Patient was last seen in primary care on 10/07/2023 by Gayle Saddie FALCON, PA-C.  Called Nurse Triage reporting URI.  Symptoms began several days ago.  Interventions attempted: OTC medications: mucinex , tylenol , motrin, and sudefed.  Symptoms are: gradually worsening.  Triage Disposition: See Physician Within 24 Hours  Patient/caregiver understands and will follow disposition?: Yes Message from [facility] sent at 03/28/2024  8:06 AM EST  Summary: low grade fever, cough   Reason for Triage: Patient has had a low grade fever, cough, chills and nausea.  Husband requesting appointment for wife and himself (FMW:[id number]) , [phone]         Reason for Disposition  Fever present > 3 days (72 hours)  Answer Assessment - Initial Assessment Questions Symptoms started Saturday- low grade fever, chills, cough, nausea with one episode of emesis. Generalized weakness Tylenol , mucinex , and sudafed- not helping but not worse. Denies SOB, CP, Dizziness. Has appetite but not a ton of energy to make food.   No appt within dispo with PCP. Not willing to see alternative office at this time. Will go to UC if no improvement with OTCS   1. ONSET: When did the nasal discharge start?      Congestion- Saturday  2. AMOUNT: How much discharge is there?      denies 3. COUGH: Do you have a cough? If Yes, ask: Describe the color of your mucus. (e.g., clear, white, yellow, green)     Hacky cough 4. RESPIRATORY DISTRESS: Describe your breathing.      Denies SOB, CP, Dizziness 5. FEVER: Do you have a fever? If Yes, ask: What is your temperature, how was it measured, and when did it start?     Low grade- based off chills, body aches 6. SEVERITY: Overall, how bad are you feeling right now? (e.g., doesn't interfere with normal activities, staying home from school/work, staying in bed)      Hard to get up and make food- no  energy 7. OTHER SYMPTOMS: Do you have any other symptoms? (e.g., earache, mouth sores, sore throat, wheezing)     Throat sore from coughing, nausea with one episode of emesis, cough, generalized weakness  Protocols used: Common Cold-A-AH

## 2024-03-28 NOTE — Telephone Encounter (Signed)
 Tried to contact pt to see about coming in to be swabbed and there was no answer and VM had not been set up.

## 2024-04-04 ENCOUNTER — Other Ambulatory Visit: Payer: Self-pay

## 2024-04-04 DIAGNOSIS — E119 Type 2 diabetes mellitus without complications: Secondary | ICD-10-CM

## 2024-04-04 DIAGNOSIS — E785 Hyperlipidemia, unspecified: Secondary | ICD-10-CM

## 2024-04-04 DIAGNOSIS — E66813 Obesity, class 3: Secondary | ICD-10-CM

## 2024-04-04 DIAGNOSIS — E538 Deficiency of other specified B group vitamins: Secondary | ICD-10-CM

## 2024-04-04 DIAGNOSIS — I10 Essential (primary) hypertension: Secondary | ICD-10-CM

## 2024-04-06 ENCOUNTER — Other Ambulatory Visit

## 2024-04-06 DIAGNOSIS — E119 Type 2 diabetes mellitus without complications: Secondary | ICD-10-CM

## 2024-04-06 DIAGNOSIS — E538 Deficiency of other specified B group vitamins: Secondary | ICD-10-CM

## 2024-04-06 DIAGNOSIS — E785 Hyperlipidemia, unspecified: Secondary | ICD-10-CM

## 2024-04-06 DIAGNOSIS — I10 Essential (primary) hypertension: Secondary | ICD-10-CM

## 2024-04-06 DIAGNOSIS — Z6841 Body Mass Index (BMI) 40.0 and over, adult: Secondary | ICD-10-CM

## 2024-04-13 ENCOUNTER — Encounter

## 2024-04-27 ENCOUNTER — Ambulatory Visit

## 2024-04-27 VITALS — BP 107/73 | HR 84 | Ht 67.0 in | Wt 216.2 lb

## 2024-04-27 DIAGNOSIS — F411 Generalized anxiety disorder: Secondary | ICD-10-CM | POA: Diagnosis not present

## 2024-04-27 DIAGNOSIS — F17200 Nicotine dependence, unspecified, uncomplicated: Secondary | ICD-10-CM | POA: Diagnosis not present

## 2024-04-27 DIAGNOSIS — Z122 Encounter for screening for malignant neoplasm of respiratory organs: Secondary | ICD-10-CM

## 2024-04-27 DIAGNOSIS — E1169 Type 2 diabetes mellitus with other specified complication: Secondary | ICD-10-CM | POA: Diagnosis not present

## 2024-04-27 DIAGNOSIS — E119 Type 2 diabetes mellitus without complications: Secondary | ICD-10-CM

## 2024-04-27 DIAGNOSIS — Z7985 Long-term (current) use of injectable non-insulin antidiabetic drugs: Secondary | ICD-10-CM | POA: Diagnosis not present

## 2024-04-27 DIAGNOSIS — Z6841 Body Mass Index (BMI) 40.0 and over, adult: Secondary | ICD-10-CM

## 2024-04-27 DIAGNOSIS — E1159 Type 2 diabetes mellitus with other circulatory complications: Secondary | ICD-10-CM

## 2024-04-27 DIAGNOSIS — I152 Hypertension secondary to endocrine disorders: Secondary | ICD-10-CM | POA: Diagnosis not present

## 2024-04-27 DIAGNOSIS — E785 Hyperlipidemia, unspecified: Secondary | ICD-10-CM | POA: Diagnosis not present

## 2024-04-27 DIAGNOSIS — E66813 Obesity, class 3: Secondary | ICD-10-CM | POA: Diagnosis not present

## 2024-04-27 MED ORDER — MOUNJARO 10 MG/0.5ML ~~LOC~~ SOAJ: 10.0000 mg | SUBCUTANEOUS | 2 refills | Status: AC

## 2024-04-27 NOTE — Progress Notes (Signed)
 "  Established Patient Office Visit  Subjective   Patient ID: Molly Payne, female    DOB: [birth year]  Age: 64 y.o. 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  Ocular trauma - Recent fall in the shower resulting in direct trauma to the eye after striking a rack - Development of a cross-like mark on in her vision - Initially did not seek treatment; subsequently evaluated by vision care - Advised to undergo emergency retinal replacement surgery - Doing well since then with no further complications  Weight management and diabetes - Currently taking 10 mg of Mounjaro  - Lost 24 pounds since last visit; current weight is 216 pounds - Goal to reduce weight below 200 pounds - No longer taking Zofran  for nausea - Adequate supply of needles and strips for diabetes management  Hypertension and hyperlipidemia - Current medications include losartan  and rosuvastatin . Tolerating medications well.   Mental health - Currently taking Lexapro . Mood symptoms well controlled   Tobacco use and preventive screening - Continues to smoke daily. Not ready to quit at this time - No lung cancer screening performed in the past couple of years  Immunization status - No flu shot received this season - No shingles vaccine received  Colorectal cancer screening - Colonoscopy performed in 2024 with removal of precancerous polyps - Uncertain about recommended follow-up schedule; plans to contact GI specialist for clarification          ROS Per HPI.    Objective:     BP 107/73   Pulse 84   Ht 5' 7 (1.702 m)   Wt 216 lb 4 oz (98.1 kg)   SpO2 97%   BMI 33.87 kg/m    Physical Exam Constitutional:      General: She is not in acute distress.    Appearance: Normal appearance.  Cardiovascular:     Rate and Rhythm: Normal rate and regular  rhythm.     Heart sounds: Normal heart sounds. No murmur heard.    No friction rub. No gallop.  Pulmonary:     Effort: Pulmonary effort is normal. No respiratory distress.     Breath sounds: Normal breath sounds.  Musculoskeletal:        General: No swelling.     Cervical back: Neck supple.  Lymphadenopathy:     Cervical: No cervical adenopathy.  Skin:    General: Skin is warm and dry.  Neurological:     General: No focal deficit present.     Mental Status: She is alert.  Psychiatric:        Mood and Affect: Mood normal.        Behavior: Behavior normal.        Thought Content: Thought content normal.      No results found for any visits on 04/27/24.  Last CBC Lab Results  Component Value Date   WBC 7.9 11/30/2023   HGB 17.1 (H) 11/30/2023   HCT 50.4 (H) 11/30/2023   MCV 89.5 11/30/2023   MCH 30.4 11/30/2023   RDW 13.3 11/30/2023   PLT 100 (L) 11/30/2023   Last metabolic panel Lab Results  Component Value Date   GLUCOSE 82 11/30/2023   NA 137 11/30/2023   K 4.0 11/30/2023   CL 102 11/30/2023   CO2 30 11/30/2023   BUN  9 11/30/2023   CREATININE 0.80 11/30/2023   GFRNONAA >60 11/30/2023   CALCIUM  9.4 11/30/2023   PROT 7.3 11/30/2023   ALBUMIN 4.5 11/30/2023   LABGLOB 2.7 09/30/2023   AGRATIO 1.9 07/30/2022   BILITOT 0.6 11/30/2023   ALKPHOS 78 11/30/2023   AST 28 11/30/2023   ALT 18 11/30/2023   ANIONGAP 5 11/30/2023   Last lipids Lab Results  Component Value Date   CHOL 129 09/30/2023   HDL 33 (L) 09/30/2023   LDLCALC 63 09/30/2023   TRIG 200 (H) 09/30/2023   CHOLHDL 3.9 09/30/2023   Last hemoglobin A1c Lab Results  Component Value Date   HGBA1C 5.8 (H) 09/30/2023   Last thyroid functions Lab Results  Component Value Date   TSH 1.460 07/30/2022   Last vitamin D No results found for: 25OHVITD2, 25OHVITD3, VD25OH    The ASCVD Risk score (Arnett DK, et al., 2019) failed to calculate for the following reasons:   The valid total  cholesterol range is 130 to 320 mg/dL    Assessment & Plan:   Type 2 diabetes mellitus without complication, without long-term current use of insulin (HCC) Assessment & Plan: Rechecking A1c with labs. Eye exam and foot exam UTD. UACR UTD. Continue Mounjaro  10 mg weekly. Advised patient to follow up when she is ready to increase the dose.   Orders: -     Mounjaro ; Inject 10 mg into the skin once a week.  Dispense: 4 mL; Refill: 2  Screening for lung cancer -     CT CHEST LUNG CANCER SCREENING LOW DOSE WO CONTRAST; Future  Hyperlipidemia associated with type 2 diabetes mellitus (HCC) Assessment & Plan: Rechecking lipid panel and LFTs with labs. Continue rosuvastatin .    Hypertension associated with diabetes (HCC) Assessment & Plan: BP goal <130/80. Stable, at goal. Continue losartan  25 mg daily. Will continue to monitor.    GAD (generalized anxiety disorder) Assessment & Plan: Stable on lexapro .  - Continue medication   Class 3 severe obesity due to excess calories with serious comorbidity and body mass index (BMI) of 40.0 to 44.9 in adult [facility]) Assessment & Plan: Starting weight: 268 lbs  Current weight: 216 lbs  Change in weight: -52 lbs in total  Continue weight loss efforts and Mounjaro  10 mg weekly.   Tobacco use disorder Assessment & Plan: Patient is not interested in quitting right now. She would like to tackle one issue at a time and once her weight is better controlled, she would like to discuss quitting. Agreeable to low-dose lung CT screening scan.      Return in about 6 months (around 10/25/2024) for HTN, HLD, DM.    Saddie JULIANNA Sacks, PA-C  "

## 2024-04-27 NOTE — Assessment & Plan Note (Signed)
 Patient is not interested in quitting right now.  She would like to tackle one issue at a time and once her weight is better controlled, she would like to discuss quitting.  Agreeable to low-dose lung CT screening scan.

## 2024-04-27 NOTE — Assessment & Plan Note (Signed)
 Rechecking lipid panel and LFTs with labs. Continue rosuvastatin .

## 2024-04-27 NOTE — Assessment & Plan Note (Signed)
 BP goal <130/80.  Stable, at goal. Continue losartan 25 mg daily.  Will continue to monitor.

## 2024-04-27 NOTE — Assessment & Plan Note (Signed)
 Starting weight: 268 lbs  Current weight: 216 lbs  Change in weight: -52 lbs in total  Continue weight loss efforts and Mounjaro  10 mg weekly.

## 2024-04-27 NOTE — Assessment & Plan Note (Signed)
 Rechecking A1c with labs. Eye exam and foot exam UTD. UACR UTD. Continue Mounjaro  10 mg weekly. Advised patient to follow up when she is ready to increase the dose.

## 2024-04-27 NOTE — Patient Instructions (Signed)
 VISIT SUMMARY: During your visit, we discussed your recent eye injury, a bout of the flu, weight management, diabetes, hypertension, hyperlipidemia, mental health, tobacco use, and preventive screenings.  YOUR PLAN: TYPE 2 DIABETES MELLITUS: Your diabetes is well-controlled with significant weight loss. -Continue taking Mounjaro  10 mg. -Rescheduled blood work for diabetic kidney function.  OBESITY: You have achieved significant weight loss and are currently at 216 lbs. -Continue taking Mounjaro  10 mg. -Continue working towards your goal of reducing weight below 200 lbs.  NICOTINE DEPENDENCE: You continue to smoke approximately one pack per day. -Offered smoking cessation support if you are interested.  PERSONAL HISTORY OF COLONIC POLYPS: Precancerous polyps were removed during your last colonoscopy. -Contact your GI specialist to determine the follow-up interval for your next colonoscopy.  If you have any problems before your next visit feel free to message me via MyChart (minor issues or questions) or call the office, otherwise you may reach out to schedule an office visit.  Thank you! Saddie Sacks, PA-C

## 2024-04-27 NOTE — Assessment & Plan Note (Signed)
 Stable on lexapro.  Continue medication.

## 2024-04-28 ENCOUNTER — Other Ambulatory Visit

## 2024-04-29 LAB — CBC WITH DIFFERENTIAL/PLATELET
Basophils Absolute: 0.1 x10E3/uL (ref 0.0–0.2)
Basos: 1 %
EOS (ABSOLUTE): 0.1 x10E3/uL (ref 0.0–0.4)
Eos: 1 %
Hematocrit: 53.1 % — ABNORMAL HIGH (ref 34.0–46.6)
Hemoglobin: 17.6 g/dL — ABNORMAL HIGH (ref 11.1–15.9)
Immature Grans (Abs): 0 x10E3/uL (ref 0.0–0.1)
Immature Granulocytes: 0 %
Lymphocytes Absolute: 2.1 x10E3/uL (ref 0.7–3.1)
Lymphs: 23 %
MCH: 31.5 pg (ref 26.6–33.0)
MCHC: 33.1 g/dL (ref 31.5–35.7)
MCV: 95 fL (ref 79–97)
Monocytes Absolute: 0.8 x10E3/uL (ref 0.1–0.9)
Monocytes: 9 %
Neutrophils Absolute: 5.9 x10E3/uL (ref 1.4–7.0)
Neutrophils: 66 %
Platelets: 136 x10E3/uL — ABNORMAL LOW (ref 150–450)
RBC: 5.58 x10E6/uL — ABNORMAL HIGH (ref 3.77–5.28)
RDW: 13.3 % (ref 11.7–15.4)
WBC: 9 x10E3/uL (ref 3.4–10.8)

## 2024-04-29 LAB — COMPREHENSIVE METABOLIC PANEL WITH GFR
ALT: 13 IU/L (ref 0–32)
AST: 22 IU/L (ref 0–40)
Albumin: 4.2 g/dL (ref 3.9–4.9)
Alkaline Phosphatase: 90 IU/L (ref 49–135)
BUN/Creatinine Ratio: 11 — ABNORMAL LOW (ref 12–28)
BUN: 9 mg/dL (ref 8–27)
Bilirubin Total: 0.6 mg/dL (ref 0.0–1.2)
CO2: 23 mmol/L (ref 20–29)
Calcium: 9.6 mg/dL (ref 8.7–10.3)
Chloride: 102 mmol/L (ref 96–106)
Creatinine, Ser: 0.85 mg/dL (ref 0.57–1.00)
Globulin, Total: 2.4 g/dL (ref 1.5–4.5)
Glucose: 68 mg/dL — ABNORMAL LOW (ref 70–99)
Potassium: 4.2 mmol/L (ref 3.5–5.2)
Sodium: 141 mmol/L (ref 134–144)
Total Protein: 6.6 g/dL (ref 6.0–8.5)
eGFR: 77 mL/min/1.73

## 2024-04-29 LAB — LIPID PANEL
Chol/HDL Ratio: 3.3 ratio (ref 0.0–4.4)
Cholesterol, Total: 121 mg/dL (ref 100–199)
HDL: 37 mg/dL — ABNORMAL LOW
LDL Chol Calc (NIH): 60 mg/dL (ref 0–99)
Triglycerides: 134 mg/dL (ref 0–149)
VLDL Cholesterol Cal: 24 mg/dL (ref 5–40)

## 2024-04-29 LAB — HEMOGLOBIN A1C
Est. average glucose Bld gHb Est-mCnc: 108 mg/dL
Hgb A1c MFr Bld: 5.4 % (ref 4.8–5.6)

## 2024-04-29 LAB — TSH: TSH: 1.44 u[IU]/mL (ref 0.450–4.500)

## 2024-04-29 LAB — B12 AND FOLATE PANEL
Folate: 3.9 ng/mL
Vitamin B-12: 1405 pg/mL — ABNORMAL HIGH (ref 232–1245)

## 2024-04-29 LAB — VITAMIN D 25 HYDROXY (VIT D DEFICIENCY, FRACTURES): Vit D, 25-Hydroxy: 8 ng/mL — ABNORMAL LOW (ref 30.0–100.0)

## 2024-05-01 ENCOUNTER — Ambulatory Visit: Payer: Self-pay

## 2024-05-01 MED ORDER — CHOLECALCIFEROL 1.25 MG (50000 UT) PO TABS: 50000.0000 [IU] | ORAL_TABLET | ORAL | 0 refills | Status: AC

## 2024-05-16 ENCOUNTER — Inpatient Hospital Stay: Admission: RE | Admit: 2024-05-16 | Source: Ambulatory Visit

## 2024-05-23 ENCOUNTER — Ambulatory Visit: Admitting: Nurse Practitioner

## 2024-05-23 ENCOUNTER — Other Ambulatory Visit

## 2024-10-18 ENCOUNTER — Other Ambulatory Visit

## 2024-10-25 ENCOUNTER — Ambulatory Visit
